# Patient Record
Sex: Male | Born: 1975 | Race: Black or African American | Hispanic: No | Marital: Married | Smoking: Current every day smoker
Health system: Southern US, Community
[De-identification: ages and names within clinical notes are randomized; demographics above are authoritative.]

## PROBLEM LIST (undated history)

## (undated) DIAGNOSIS — F1010 Alcohol abuse, uncomplicated: Secondary | ICD-10-CM

## (undated) DIAGNOSIS — T7840XA Allergy, unspecified, initial encounter: Secondary | ICD-10-CM

## (undated) DIAGNOSIS — K219 Gastro-esophageal reflux disease without esophagitis: Secondary | ICD-10-CM

## (undated) DIAGNOSIS — D649 Anemia, unspecified: Secondary | ICD-10-CM

## (undated) DIAGNOSIS — I160 Hypertensive urgency: Secondary | ICD-10-CM

## (undated) DIAGNOSIS — K7460 Unspecified cirrhosis of liver: Secondary | ICD-10-CM

## (undated) HISTORY — DX: Allergy, unspecified, initial encounter: T78.40XA

## (undated) HISTORY — PX: NO PAST SURGERIES: SHX2092

## (undated) HISTORY — DX: Gastro-esophageal reflux disease without esophagitis: K21.9

---

## 2013-05-04 ENCOUNTER — Emergency Department (HOSPITAL_BASED_OUTPATIENT_CLINIC_OR_DEPARTMENT_OTHER)
Admission: EM | Admit: 2013-05-04 | Discharge: 2013-05-04 | Disposition: A | Discharge status: Home / Self Care | Payer: BC Managed Care – PPO | Attending: Emergency Medicine | Admitting: Emergency Medicine

## 2013-05-04 ENCOUNTER — Encounter (HOSPITAL_BASED_OUTPATIENT_CLINIC_OR_DEPARTMENT_OTHER): Payer: Self-pay | Admitting: Emergency Medicine

## 2013-05-04 DIAGNOSIS — H0015 Chalazion left lower eyelid: Secondary | ICD-10-CM

## 2013-05-04 DIAGNOSIS — F17200 Nicotine dependence, unspecified, uncomplicated: Secondary | ICD-10-CM | POA: Insufficient documentation

## 2013-05-04 DIAGNOSIS — H0019 Chalazion unspecified eye, unspecified eyelid: Secondary | ICD-10-CM | POA: Insufficient documentation

## 2013-05-04 MED ORDER — ERYTHROMYCIN 2 % EX OINT: 0.5000 [in_us] | TOPICAL_OINTMENT | Freq: Two times a day (BID) | CUTANEOUS | Status: DC

## 2013-05-04 NOTE — ED Notes (Signed)
Pt amb to room 9 with quick steady gait in nad. Pt reports left eye drainage x Saturday, denies any pain or vision changes.

## 2013-05-04 NOTE — Discharge Instructions (Signed)
Chalazion  A chalazion is a swelling or hard lump on the eyelid caused by a blocked oil gland. Chalazions may occur on the upper or the lower eyelid.   CAUSES   Oil gland in the eyelid becomes blocked.  SYMPTOMS   · Swelling or hard lump on the eyelid. This lump may make it hard to see out of the eye.  · The swelling may spread to areas around the eye.  TREATMENT   · Although some chalazions disappear by themselves in 1 or 2 months, some chalazions may need to be removed.  · Medicines to treat an infection may be required.  HOME CARE INSTRUCTIONS   · Wash your hands often and dry them with a clean towel. Do not touch the chalazion.  · Apply heat to the eyelid several times a day for 10 minutes to help ease discomfort and bring any yellowish white fluid (pus) to the surface. One way to apply heat to a chalazion is to use the handle of a metal spoon.  · Hold the handle under hot water until it is hot, and then wrap the handle in paper towels so that the heat can come through without burning your skin.  · Hold the wrapped handle against the chalazion and reheat the spoon handle as needed.  · Apply heat in this fashion for 10 minutes, 4 times per day.  · Return to your caregiver to have the pus removed if it does not break (rupture) on its own.  · Do not try to remove the pus yourself by squeezing the chalazion or sticking it with a pin or needle.  · Only take over-the-counter or prescription medicines for pain, discomfort, or fever as directed by your caregiver.  SEEK IMMEDIATE MEDICAL CARE IF:   · You have pain in your eye.  · Your vision changes.  · The chalazion does not go away.  · The chalazion becomes painful, red, or swollen, grows larger, or does not start to disappear after 2 weeks.  MAKE SURE YOU:   · Understand these instructions.  · Will watch your condition.  · Will get help right away if you are not doing well or get worse.  Document Released: 02/22/2000 Document Revised: 05/19/2011 Document Reviewed:  06/11/2009  [employer]® Patient Information ©2014 [employer].

## 2013-05-04 NOTE — ED Provider Notes (Signed)
CSN: [id number]     Arrival date & time 05/04/13  [id number] History   First MD Initiated Contact with Patient 05/04/13 [phone]     Chief Complaint  Patient presents with  . Eye Problem     (Consider location/radiation/quality/duration/timing/severity/associated sxs/prior Treatment) Patient is a 38 y.o. male presenting with eye problem. The history is provided by the patient.  Eye Problem Location:  L eye Quality:  Tearing Severity:  Mild Onset quality:  Gradual Duration:  5 days Timing:  Constant Progression:  Unchanged Chronicity:  New Context: not burn, not chemical exposure, not foreign body and not using machinery   Relieved by:  Nothing Worsened by:  Nothing tried   History reviewed. No pertinent past medical history. History reviewed. No pertinent past surgical history. History reviewed. No pertinent family history. History  Substance Use Topics  . Smoking status: Current Every Day Smoker  . Smokeless tobacco: Not on file  . Alcohol Use: Not on file    Review of Systems  Constitutional: Negative for fever.  HENT: Negative for congestion, dental problem and sinus pressure.   All other systems reviewed and are negative.      Allergies  Review of patient's allergies indicates no known allergies.  Home Medications   Current Outpatient Rx  Name  Route  Sig  Dispense  Refill  . Erythromycin 2 % ointment   Topical   Apply 0.5 inches topically 2 (two) times daily. 0.5 inch ribbon in lower eyelid twice daily.   25 g   0    BP 146/97  Pulse 89  Temp(Src) 97.8 F (36.6 C) (Oral)  Resp 20  SpO2 99% Physical Exam  Nursing note and vitals reviewed. Constitutional: He is oriented to person, place, and time. He appears well-developed and well-nourished. No distress.  HENT:  Head: Normocephalic and atraumatic.    Mouth/Throat: No oropharyngeal exudate.  Eyes: EOM are normal. Pupils are equal, round, and reactive to light. Left eye exhibits no chemosis, no  discharge and no exudate. No foreign body present in the left eye. Left conjunctiva is not injected. Left conjunctiva has no hemorrhage. Left eye exhibits normal extraocular motion and no nystagmus. Left pupil is reactive.    Neck: Normal range of motion. Neck supple.  Cardiovascular: Normal rate and regular rhythm.  Exam reveals no friction rub.   No murmur heard. Pulmonary/Chest: Effort normal and breath sounds normal. No respiratory distress. He has no wheezes. He has no rales.  Abdominal: He exhibits no distension. There is no tenderness. There is no rebound.  Musculoskeletal: Normal range of motion. He exhibits no edema.  Neurological: He is alert and oriented to person, place, and time.  Skin: He is not diaphoretic.    ED Course  Procedures (including critical care time) Labs Review Labs Reviewed - No data to display Imaging Review No results found.  EKG Interpretation   None       MDM   Final diagnoses:  Chalazion of left lower eyelid    59M with L lower eyelid chalazion. No conjunctivitis noted. No fevers, URI symptoms. Mild left lateral orbital swelling. No cellulitis present. Given Erythromycin ointment and instructed to use warm compresses multiple times daily. Instructed to f/u with Optometrist.    Dagmar HaitWilliam Joniah Bednarski, MD 05/04/13 [phone]

## 2013-05-04 NOTE — ED Notes (Signed)
MD at bedside. 

## 2013-11-13 ENCOUNTER — Emergency Department (HOSPITAL_BASED_OUTPATIENT_CLINIC_OR_DEPARTMENT_OTHER)
Admission: EM | Admit: 2013-11-13 | Discharge: 2013-11-13 | Disposition: A | Discharge status: Home / Self Care | Payer: BC Managed Care – PPO | Attending: Emergency Medicine | Admitting: Emergency Medicine

## 2013-11-13 ENCOUNTER — Encounter (HOSPITAL_BASED_OUTPATIENT_CLINIC_OR_DEPARTMENT_OTHER): Payer: Self-pay | Admitting: Emergency Medicine

## 2013-11-13 DIAGNOSIS — Y929 Unspecified place or not applicable: Secondary | ICD-10-CM | POA: Diagnosis not present

## 2013-11-13 DIAGNOSIS — Y939 Activity, unspecified: Secondary | ICD-10-CM | POA: Diagnosis not present

## 2013-11-13 DIAGNOSIS — T63461A Toxic effect of venom of wasps, accidental (unintentional), initial encounter: Secondary | ICD-10-CM | POA: Diagnosis not present

## 2013-11-13 DIAGNOSIS — T6391XA Toxic effect of contact with unspecified venomous animal, accidental (unintentional), initial encounter: Secondary | ICD-10-CM | POA: Insufficient documentation

## 2013-11-13 DIAGNOSIS — T63481A Toxic effect of venom of other arthropod, accidental (unintentional), initial encounter: Secondary | ICD-10-CM

## 2013-11-13 DIAGNOSIS — F17200 Nicotine dependence, unspecified, uncomplicated: Secondary | ICD-10-CM | POA: Insufficient documentation

## 2013-11-13 NOTE — ED Provider Notes (Signed)
CSN: [id number]     Arrival date & time 11/13/13  1324 History   First MD Initiated Contact with Patient 11/13/13 0827     Chief Complaint  Patient presents with  . Insect Bite     (Consider location/radiation/quality/duration/timing/severity/associated sxs/prior Treatment) HPI Comments: The patient reports he was stung by an insect around 11 AM yesterday and has since developed itching and swelling over the lateral surface of his left hand the bite occurred. He did not see the insect is not sure what of a It shortness of breath, nausea, vomiting, swelling of the face and mouth. He has not tried anything for this. He otherwise feels well.   History reviewed. No pertinent past medical history. History reviewed. No pertinent past surgical history. No family history on file. History  Substance Use Topics  . Smoking status: Current Every Day Smoker  . Smokeless tobacco: Not on file  . Alcohol Use: Yes     Comment: beer at night    Review of Systems  Constitutional: Negative for fever, activity change, appetite change and fatigue.  HENT: Negative for congestion, facial swelling, rhinorrhea and trouble swallowing.   Eyes: Negative for photophobia and pain.  Respiratory: Negative for cough, chest tightness and shortness of breath.   Cardiovascular: Negative for chest pain and leg swelling.  Gastrointestinal: Negative for nausea, vomiting, abdominal pain, diarrhea and constipation.  Endocrine: Negative for polydipsia and polyuria.  Genitourinary: Negative for dysuria, urgency, decreased urine volume and difficulty urinating.  Musculoskeletal: Negative for back pain and gait problem.  Skin: Positive for wound. Negative for color change and rash.  Allergic/Immunologic: Negative for immunocompromised state.  Neurological: Negative for dizziness, facial asymmetry, speech difficulty, weakness, numbness and headaches.  Psychiatric/Behavioral: Negative for confusion, decreased concentration and  agitation.      Allergies  Review of patient's allergies indicates no known allergies.  Home Medications   Prior to Admission medications   Medication Sig Start Date End Date Taking? Authorizing Provider  Erythromycin 2 % ointment Apply 0.5 inches topically 2 (two) times daily. 0.5 inch ribbon in lower eyelid twice daily. 05/04/13   Elwin Mocha, MD   BP 157/87  Pulse 93  Temp(Src) 98.5 F (36.9 C) (Oral)  Resp 20  SpO2 99% Physical Exam  Constitutional: He is oriented to person, place, and time. He appears well-developed and well-nourished. No distress.  HENT:  Head: Normocephalic and atraumatic.  Mouth/Throat: No oropharyngeal exudate.  Eyes: Pupils are equal, round, and reactive to light.  Neck: Normal range of motion. Neck supple.  Cardiovascular: Normal rate, regular rhythm and normal heart sounds.  Exam reveals no gallop and no friction rub.   No murmur heard. Pulmonary/Chest: Effort normal and breath sounds normal. No respiratory distress. He has no wheezes. He has no rales.  Abdominal: Soft. Bowel sounds are normal. He exhibits no distension and no mass. There is no tenderness. There is no rebound and no guarding.  Musculoskeletal: Normal range of motion. He exhibits no edema and no tenderness.       Hands: Neurological: He is alert and oriented to person, place, and time.  Skin: Skin is warm and dry.  Psychiatric: He has a normal mood and affect.    ED Course  Procedures (including critical care time) Labs Review Labs Reviewed - No data to display  Imaging Review No results found.   EKG Interpretation None      MDM   Final diagnoses:  Allergic reaction to insect sting, accidental or unintentional, initial  encounter    Pt is a 38 y.o. male with Pmhx as above who presents with localized allergic reaction to Hymenoptera sting which occurred about 22 hours ago. Patient is otherwise well and has no signs or symptoms thoraxes DC home with instructions for  every 6 hours meningeal and/or OTC anti-itch cream. Return precautions given for any worsening symptoms including worsening redness swelling, or red streaking.        Toy Cookey, MD 11/13/13 [phone]

## 2013-11-13 NOTE — Discharge Instructions (Signed)

## 2013-11-13 NOTE — ED Notes (Addendum)
Yesterday pt thinks he may have gotten bitten by an insect, unknown which. Outer left hand is swollen and itching.

## 2015-04-10 ENCOUNTER — Encounter: Payer: Self-pay | Admitting: Medical

## 2015-04-10 ENCOUNTER — Ambulatory Visit (INDEPENDENT_AMBULATORY_CARE_PROVIDER_SITE_OTHER): Payer: BLUE CROSS/BLUE SHIELD | Admitting: Medical

## 2015-04-10 ENCOUNTER — Other Ambulatory Visit (HOSPITAL_COMMUNITY)
Admission: RE | Admit: 2015-04-10 | Discharge: 2015-04-10 | Disposition: A | Discharge status: Home / Self Care | Payer: BLUE CROSS/BLUE SHIELD | Source: Ambulatory Visit | Attending: Medical | Admitting: Medical

## 2015-04-10 VITALS — BP 120/86 | HR 78 | Temp 98.0°F | Ht 67.5 in | Wt 132.6 lb

## 2015-04-10 DIAGNOSIS — Z202 Contact with and (suspected) exposure to infections with a predominantly sexual mode of transmission: Secondary | ICD-10-CM

## 2015-04-10 DIAGNOSIS — Z113 Encounter for screening for infections with a predominantly sexual mode of transmission: Secondary | ICD-10-CM | POA: Diagnosis present

## 2015-04-10 DIAGNOSIS — Z720 Tobacco use: Secondary | ICD-10-CM | POA: Diagnosis not present

## 2015-04-10 DIAGNOSIS — J029 Acute pharyngitis, unspecified: Secondary | ICD-10-CM | POA: Diagnosis not present

## 2015-04-10 DIAGNOSIS — J309 Allergic rhinitis, unspecified: Secondary | ICD-10-CM

## 2015-04-10 DIAGNOSIS — F17200 Nicotine dependence, unspecified, uncomplicated: Secondary | ICD-10-CM | POA: Insufficient documentation

## 2015-04-10 LAB — RPR

## 2015-04-10 LAB — HIV ANTIBODY (ROUTINE TESTING W REFLEX): HIV: NONREACTIVE

## 2015-04-10 MED ORDER — METRONIDAZOLE 500 MG PO TABS: 500.0000 mg | ORAL_TABLET | Freq: Two times a day (BID) | ORAL | Status: DC

## 2015-04-10 MED ORDER — FLUTICASONE PROPIONATE 50 MCG/ACT NA SUSP: 2.0000 | Freq: Every day | NASAL | Status: DC

## 2015-04-10 MED ORDER — AZITHROMYCIN 250 MG PO TABS: ORAL_TABLET | ORAL | Status: DC

## 2015-04-10 NOTE — Progress Notes (Signed)
Pre visit review using our clinic review tool, if applicable. No additional management support is needed unless otherwise documented below in the visit note. 

## 2015-04-10 NOTE — Patient Instructions (Signed)
For trichomonas exposure will prescribe flagyl. Also will get urine std test and labs as well.  For pharyngitis and reported level of pain will get throat culture and prescribe azithromycin.  You do have some history of allergies and by exam inside of nose appears inflammed. I will prescribe flonase nasal spray for allergic rhinitis.  Follow up 1-2 wks or as needed  You could schedule a CPE on follow up. If schedule ask fo 8 am appointment. Come in fasting so we can get labs fasting.

## 2015-04-10 NOTE — Assessment & Plan Note (Signed)
flonase rx today.

## 2015-04-10 NOTE — Progress Notes (Signed)
Subjective:    Patient ID: Samuel Cantrell, male    DOB: 1975/04/15, 40 y.o.   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  Allergic rhinitis- mild allergies year round. He takes benadryl. Genella Rife- very mild and only one time a month. If eats pepperoni pizza.  Smoker- for 8-9 years.   Pt in for follow up. Pt wife had trichomonas. Pt states he has no symptoms. But his wife was told partner needs to be treated.  Pt has sore throat since last Sunday. Has been moderate to severe. Pt states fever about 5 days ago. No sneezing, no itchy eyes or runny nose. No body aches.  Pt has not had physical in some time.      Review of Systems  Constitutional: Positive for fever. Negative for chills and fatigue.       Early on.  HENT: Positive for postnasal drip and sore throat. Negative for congestion, ear pain, hearing loss, mouth sores, sinus pressure, tinnitus and voice change.        At times feels mucous on back of his throat.  Respiratory: Negative for apnea, cough, choking and wheezing.   Cardiovascular: Negative for chest pain and palpitations.  Gastrointestinal: Negative for abdominal pain.  Musculoskeletal: Negative for myalgias and back pain.  Neurological: Negative for dizziness and headaches.  Hematological: Negative for adenopathy. Does not bruise/bleed easily.  Psychiatric/Behavioral: Negative for behavioral problems and confusion.     History reviewed. No pertinent past medical history.  Social History   Social History  . Marital Status: Married    Spouse Name: N/A  . Number of Children: N/A  . Years of Education: N/A   Occupational History  . Not on file.   Social History Main Topics  . Smoking status: Current Every Day Smoker  . Smokeless tobacco: Not on file  . Alcohol Use: Yes     Comment: beer at night  . Drug Use:  No  . Sexual Activity: Not on file   Other Topics Concern  . Not on file   Social History Narrative    History reviewed. No pertinent past surgical history.  History reviewed. No pertinent family history.  No Known Allergies  No current outpatient prescriptions on file prior to visit.   No current facility-administered medications on file prior to visit.    BP 120/86 mmHg  Pulse 78  Temp(Src) 98 F (36.7 C) (Oral)  Ht 5' 7.5" (1.715 m)  Wt 132 lb 9.6 oz (60.147 kg)  BMI 20.45 kg/m2  SpO2 98%       Objective:   Physical Exam  General  Mental Status - Alert. General Appearance - Well groomed. Not in acute distress.  Skin Rashes- No Rashes.  HEENT Head- Normal. Ear Auditory Canal - Left- Normal. Right - Normal.Tympanic Membrane- Left- Normal. Right- Normal. Eye Sclera/Conjunctiva- Left- Normal. Right- Normal. Nose & Sinuses Nasal Mucosa- Left-  Boggy and Congested. Right-  Boggy and  Congested.Bilateral no  maxillary and no  frontal sinus pressure. Mouth & Throat Lips: Upper Lip- Normal: no dryness, cracking, pallor, cyanosis, or vesicular eruption. Lower Lip-Normal: no dryness, cracking, pallor, cyanosis or vesicular eruption. Buccal Mucosa- Bilateral- No Aphthous ulcers. Oropharynx- No Discharge but  Erythema. Tonsils: Characteristics- Bilateral- mild Erythema and  Congestion. Size/Enlargement- Bilateral- No enlargement. Discharge- bilateral-None.  Neck Neck- Supple.  No Masses.   Chest and Lung Exam Auscultation: Breath Sounds:-Clear even and unlabored.  Cardiovascular Auscultation:Rythm- Regular, rate and rhythm. Murmurs & Other Heart Sounds:Ausculatation of the heart reveal- No Murmurs.  Lymphatic Head & Neck General Head & Neck Lymphatics: Bilateral: Description- No Localized lymphadenopathy.   Genital- no discharge from meatus. No lesion penis. Testicles non-tender.        Assessment & Plan:  For trichomonas exposure will prescribe  flagyl. Also will get urine std test and labs as well.  For pharyngitis and reported level of pain will get throat culture and prescribe azithromycin.  You do have some history of allergies and by exam inside of nose appears inflammed. I will prescribe flonase nasal spray for allergic rhinitis.  Follow up 1-2 wks or as needed  You could schedule a CPE on follow up. If schedule ask fo 8 am appointment. Come in fasting so we can get labs fasting.

## 2015-04-10 NOTE — Assessment & Plan Note (Signed)
Will discuss and counsel on next visit. Plan to offer wellbutrin.

## 2015-04-11 LAB — URINE CYTOLOGY ANCILLARY ONLY
Chlamydia: NEGATIVE
Neisseria Gonorrhea: NEGATIVE
Trichomonas: NEGATIVE

## 2015-04-11 LAB — CULTURE, GROUP A STREP

## 2015-04-14 ENCOUNTER — Other Ambulatory Visit: Payer: Self-pay | Admitting: Medical

## 2015-04-16 NOTE — Telephone Encounter (Signed)
Samuel Cantrell please advise, Pt states that he is still having some sore throat symptoms and wanted to know if he could have a refill of the antibiotic.

## 2015-04-16 NOTE — Telephone Encounter (Signed)
Spoke with pt  and he states that he is feeling better now and was offered an appointment and he states that he wanted to make sure that all the infection was gone. Pt will call back if he has any other symptoms. Per PCP pt will need to come in for an appointment and pt declined appointment.

## 2015-05-02 ENCOUNTER — Ambulatory Visit (INDEPENDENT_AMBULATORY_CARE_PROVIDER_SITE_OTHER): Payer: BLUE CROSS/BLUE SHIELD | Admitting: Medical

## 2015-05-02 ENCOUNTER — Encounter: Payer: Self-pay | Admitting: Medical

## 2015-05-02 VITALS — BP 112/82 | HR 94 | Temp 98.4°F | Ht 67.5 in | Wt 133.6 lb

## 2015-05-02 DIAGNOSIS — Z0000 Encounter for general adult medical examination without abnormal findings: Secondary | ICD-10-CM | POA: Diagnosis not present

## 2015-05-02 DIAGNOSIS — Z23 Encounter for immunization: Secondary | ICD-10-CM | POA: Diagnosis not present

## 2015-05-02 LAB — CBC WITH DIFFERENTIAL/PLATELET
BASOS ABS: 0 10*3/uL (ref 0.0–0.1)
Basophils Relative: 0.7 % (ref 0.0–3.0)
EOS ABS: 0.1 10*3/uL (ref 0.0–0.7)
Eosinophils Relative: 2.8 % (ref 0.0–5.0)
HEMATOCRIT: 46 % (ref 39.0–52.0)
HEMOGLOBIN: 15.6 g/dL (ref 13.0–17.0)
LYMPHS PCT: 50.5 % — AB (ref 12.0–46.0)
Lymphs Abs: 2.5 10*3/uL (ref 0.7–4.0)
MCHC: 33.9 g/dL (ref 30.0–36.0)
MCV: 84.4 fl (ref 78.0–100.0)
MONO ABS: 0.6 10*3/uL (ref 0.1–1.0)
Monocytes Relative: 11.4 % (ref 3.0–12.0)
Neutro Abs: 1.7 10*3/uL (ref 1.4–7.7)
Neutrophils Relative %: 34.6 % — ABNORMAL LOW (ref 43.0–77.0)
Platelets: 159 10*3/uL (ref 150.0–400.0)
RBC: 5.46 Mil/uL (ref 4.22–5.81)
RDW: 14.3 % (ref 11.5–15.5)
WBC: 4.9 10*3/uL (ref 4.0–10.5)

## 2015-05-02 LAB — POC URINALSYSI DIPSTICK (AUTOMATED)
Bilirubin, UA: NEGATIVE
Blood, UA: NEGATIVE
Glucose, UA: NEGATIVE
Ketones, UA: NEGATIVE
LEUKOCYTES UA: NEGATIVE
Nitrite, UA: NEGATIVE
PROTEIN UA: NEGATIVE
Spec Grav, UA: <=1.005
Urobilinogen, UA: 0.2
pH, UA: 6

## 2015-05-02 LAB — COMPREHENSIVE METABOLIC PANEL
ALBUMIN: 4.4 g/dL (ref 3.5–5.2)
ALT: 50 U/L (ref 0–53)
AST: 82 U/L — ABNORMAL HIGH (ref 0–37)
Alkaline Phosphatase: 89 U/L (ref 39–117)
BILIRUBIN TOTAL: 1.1 mg/dL (ref 0.2–1.2)
BUN: 8 mg/dL (ref 6–23)
CALCIUM: 9.3 mg/dL (ref 8.4–10.5)
CHLORIDE: 98 meq/L (ref 96–112)
CO2: 32 mEq/L (ref 19–32)
Creatinine, Ser: 0.77 mg/dL (ref 0.40–1.50)
GFR: 142.48 mL/min (ref >60.00)
Glucose, Bld: 102 mg/dL — ABNORMAL HIGH (ref 70–99)
Potassium: 3.4 mEq/L — ABNORMAL LOW (ref 3.5–5.1)
SODIUM: 137 meq/L (ref 135–145)
Total Protein: 7.9 g/dL (ref 6.0–8.3)

## 2015-05-02 LAB — LIPID PANEL
CHOLESTEROL: 172 mg/dL (ref 0–200)
HDL: 69.9 mg/dL (ref >39.00)
LDL CALC: 88 mg/dL (ref 0–99)
NONHDL: 102.54
Total CHOL/HDL Ratio: 2
Triglycerides: 72 mg/dL (ref 0.0–149.0)
VLDL: 14.4 mg/dL (ref 0.0–40.0)

## 2015-05-02 LAB — TSH: TSH: 1.22 u[IU]/mL (ref 0.35–4.50)

## 2015-05-02 NOTE — Addendum Note (Signed)
Addended by: Neldon Labella on: 05/02/2015 08:49 AM   Modules accepted: Orders

## 2015-05-02 NOTE — Assessment & Plan Note (Signed)
Cbc, cmp, tsh, lipid, ua tdap today.

## 2015-05-02 NOTE — Progress Notes (Signed)
Subjective:    Patient ID: Samuel Cantrell, male    DOB: [birth year], 40 y.o.   MRN: [id number]  HPI  Pt is here for Physical exam. 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  Pt needs tdap. Will get tdap.   Pt does not take flu vaccine.  Pt is smoking 6-7 cigarettes. Pt  Drinks 2-3 beers 3-4 times a week.     Below block of empty data accidentally pulled in to chart. Then tried to delete. Text deleted but format not deleted.                                                                                                                                                                                                                                                                                                                                                                                                                                                       Past Medical History  Diagnosis Date  . Allergy   . GERD (gastroesophageal reflux disease)     Social History   Social History  . Marital Status: Married    Spouse Name: N/A  . Number of Children: N/A  . Years of Education:  N/A   Occupational History  . Not on file.   Social History Main Topics  . Smoking status: Current Every Day Smoker -- 0.50 packs/day for 8 years    Types: Cigarettes  . Smokeless tobacco: Not on file  . Alcohol Use: Yes     Comment: every other night 2 beers  . Drug Use: No  . Sexual Activity: Yes   Other Topics Concern  . Not on file   Social History Narrative    History reviewed. No pertinent past surgical history.  Family History  Problem Relation Age of Onset  . Hypertension Mother     No Known Allergies  Current Outpatient Prescriptions on File Prior to Visit   Medication Sig Dispense Refill  . DiphenhydrAMINE HCl (BENADRYL ALLERGY PO) Take by mouth as needed.    . fluticasone (FLONASE) 50 MCG/ACT nasal spray Place 2 sprays into both nostrils daily. 16 g 1   No current facility-administered medications on file prior to visit.    BP 112/82 mmHg  Pulse 94  Temp(Src) 98.4 F (36.9 C) (Oral)  Ht 5' 7.5" (1.715 m)  Wt 133 lb 9.6 oz (60.601 kg)  BMI 20.60 kg/m2  SpO2 98%       Review of Systems  Constitutional: Negative for fever, chills and fatigue.  HENT: Negative for congestion, ear pain, hearing loss, mouth sores, nosebleeds and postnasal drip.   Respiratory: Negative for cough, chest tightness, shortness of breath and wheezing.   Cardiovascular: Negative for chest pain and palpitations.  Gastrointestinal: Negative for abdominal pain.  Genitourinary: Negative for dysuria.  Musculoskeletal: Negative for myalgias and back pain.  Neurological: Negative for dizziness and facial asymmetry.  Hematological: Negative for adenopathy. Does not bruise/bleed easily.  Psychiatric/Behavioral: Negative for behavioral problems and confusion.       Objective:   Physical Exam  General Mental Status- Alert. General Appearance- Not in acute distress.   Skin General: Color- Normal Color. Moisture- Normal Moisture.  Neck Carotid Arteries- Normal color. Moisture- Normal Moisture. No carotid bruits. No JVD.  Chest and Lung Exam Auscultation: Breath Sounds:-Normal.  Cardiovascular Auscultation:Rythm- Regular. Murmurs & Other Heart Sounds:Auscultation of the heart reveals- No Murmurs.  Abdomen Inspection:-Inspeection Normal. Palpation/Percussion:Note:No mass. Palpation and Percussion of the abdomen reveal- Non Tender, Non Distended + BS, no rebound or guarding.   Neurologic Cranial Nerve exam:- CN III-XII intact(No nystagmus), symmetric smile. Strength:- 5/5 equal and symmetric strength both upper and lower extremities.  Genital-  deferred since checked just recent and no new symptoms. Rectal- not done due to age and neg fh hx except grandad prostate CA in 40 years of age.      Assessment & Plan:

## 2015-05-02 NOTE — Addendum Note (Signed)
Addended by: Neldon Labella on: 05/02/2015 08:47 AM   Modules accepted: Orders

## 2015-05-02 NOTE — Patient Instructions (Addendum)
Wellness examination Cbc, cmp, tsh, lipid, ua tdap today.   Counseled and recommended to stop smoking. Pt not ready. Offered rx to help when he feels ready to try/motivated.  Preventive Care for Adults, Male A healthy lifestyle and preventive care can promote health and wellness. Preventive health guidelines for men include the following key practices:  A routine yearly physical is a good way to check with your health care provider about your health and preventative screening. It is a chance to share any concerns and updates on your health and to receive a thorough exam.  Visit your dentist for a routine exam and preventative care every 6 months. Brush your teeth twice a day and floss once a day. Good oral hygiene prevents tooth decay and gum disease.  The frequency of eye exams is based on your age, health, family medical history, use of contact lenses, and other factors. Follow your health care provider's recommendations for frequency of eye exams.  Eat a healthy diet. Foods such as vegetables, fruits, whole grains, low-fat dairy products, and lean protein foods contain the nutrients you need without too many calories. Decrease your intake of foods high in solid fats, added sugars, and salt. Eat the right amount of calories for you.Get information about a proper diet from your health care provider, if necessary.  Regular physical exercise is one of the most important things you can do for your health. Most adults should get at least 150 minutes of moderate-intensity exercise (any activity that increases your heart rate and causes you to sweat) each week. In addition, most adults need muscle-strengthening exercises on 2 or more days a week.  Maintain a healthy weight. The body mass index (BMI) is a screening tool to identify possible weight problems. It provides an estimate of body fat based on height and weight. Your health care provider can find your BMI and can help you achieve or maintain a  healthy weight.For adults 20 years and older:  A BMI below 18.5 is considered underweight.  A BMI of 18.5 to 24.9 is normal.  A BMI of 25 to 29.9 is considered overweight.  A BMI of 30 and above is considered obese.  Maintain normal blood lipids and cholesterol levels by exercising and minimizing your intake of saturated fat. Eat a balanced diet with plenty of fruit and vegetables. Blood tests for lipids and cholesterol should begin at age 45 and be repeated every 5 years. If your lipid or cholesterol levels are high, you are over 50, or you are at high risk for heart disease, you may need your cholesterol levels checked more frequently.Ongoing high lipid and cholesterol levels should be treated with medicines if diet and exercise are not working.  If you smoke, find out from your health care provider how to quit. If you do not use tobacco, do not start.  Lung cancer screening is recommended for adults aged 62-80 years who are at high risk for developing lung cancer because of a history of smoking. A yearly low-dose CT scan of the lungs is recommended for people who have at least a 30-pack-year history of smoking and are a current smoker or have quit within the past 15 years. A pack year of smoking is smoking an average of 1 pack of cigarettes a day for 1 year (for example: 1 pack a day for 30 years or 2 packs a day for 15 years). Yearly screening should continue until the smoker has stopped smoking for at least 15 years. Yearly  screening should be stopped for people who develop a health problem that would prevent them from having lung cancer treatment.  If you choose to drink alcohol, do not have more than 2 drinks per day. One drink is considered to be 12 ounces (355 mL) of beer, 5 ounces (148 mL) of wine, or 1.5 ounces (44 mL) of liquor.  Avoid use of street drugs. Do not share needles with anyone. Ask for help if you need support or instructions about stopping the use of drugs.  High blood  pressure causes heart disease and increases the risk of stroke. Your blood pressure should be checked at least every 1-2 years. Ongoing high blood pressure should be treated with medicines, if weight loss and exercise are not effective.  If you are 20-1 years old, ask your health care provider if you should take aspirin to prevent heart disease.  Diabetes screening is done by taking a blood sample to check your blood glucose level after you have not eaten for a certain period of time (fasting). If you are not overweight and you do not have risk factors for diabetes, you should be screened once every 3 years starting at age 71. If you are overweight or obese and you are 12-87 years of age, you should be screened for diabetes every year as part of your cardiovascular risk assessment.  Colorectal cancer can be detected and often prevented. Most routine colorectal cancer screening begins at the age of 50 and continues through age 1. However, your health care provider may recommend screening at an earlier age if you have risk factors for colon cancer. On a yearly basis, your health care provider may provide home test kits to check for hidden blood in the stool. Use of a small camera at the end of a tube to directly examine the colon (sigmoidoscopy or colonoscopy) can detect the earliest forms of colorectal cancer. Talk to your health care provider about this at age 46, when routine screening begins. Direct exam of the colon should be repeated every 5-10 years through age 79, unless early forms of precancerous polyps or small growths are found.  People who are at an increased risk for hepatitis B should be screened for this virus. You are considered at high risk for hepatitis B if:  You were born in a country where hepatitis B occurs often. Talk with your health care provider about which countries are considered high risk.  Your parents were born in a high-risk country and you have not received a shot to  protect against hepatitis B (hepatitis B vaccine).  You have HIV or AIDS.  You use needles to inject street drugs.  You live with, or have sex with, someone who has hepatitis B.  You are a man who has sex with other men (MSM).  You get hemodialysis treatment.  You take certain medicines for conditions such as cancer, organ transplantation, and autoimmune conditions.  Hepatitis C blood testing is recommended for all people born from [birth year] through 1965 and any individual with known risks for hepatitis C.  Practice safe sex. Use condoms and avoid high-risk sexual practices to reduce the spread of sexually transmitted infections (STIs). STIs include gonorrhea, chlamydia, syphilis, trichomonas, herpes, HPV, and human immunodeficiency virus (HIV). Herpes, HIV, and HPV are viral illnesses that have no cure. They can result in disability, cancer, and death.  If you are a man who has sex with other men, you should be screened at least once per year for:  HIV.  Urethral, rectal, and pharyngeal infection of gonorrhea, chlamydia, or both.  If you are at risk of being infected with HIV, it is recommended that you take a prescription medicine daily to prevent HIV infection. This is called preexposure prophylaxis (PrEP). You are considered at risk if:  You are a man who has sex with other men (MSM) and have other risk factors.  You are a heterosexual man, are sexually active, and are at increased risk for HIV infection.  You take drugs by injection.  You are sexually active with a partner who has HIV.  Talk with your health care provider about whether you are at high risk of being infected with HIV. If you choose to begin PrEP, you should first be tested for HIV. You should then be tested every 3 months for as long as you are taking PrEP.  A one-time screening for abdominal aortic aneurysm (AAA) and surgical repair of large AAAs by ultrasound are recommended for men ages 47 to 62 years who are  current or former smokers.  Healthy men should no longer receive prostate-specific antigen (PSA) blood tests as part of routine cancer screening. Talk with your health care provider about prostate cancer screening.  Testicular cancer screening is not recommended for adult males who have no symptoms. Screening includes self-exam, a health care provider exam, and other screening tests. Consult with your health care provider about any symptoms you have or any concerns you have about testicular cancer.  Use sunscreen. Apply sunscreen liberally and repeatedly throughout the day. You should seek shade when your shadow is shorter than you. Protect yourself by wearing long sleeves, pants, a wide-brimmed hat, and sunglasses year round, whenever you are outdoors.  Once a month, do a whole-body skin exam, using a mirror to look at the skin on your back. Tell your health care provider about new moles, moles that have irregular borders, moles that are larger than a pencil eraser, or moles that have changed in shape or color.  Stay current with required vaccines (immunizations).  Influenza vaccine. All adults should be immunized every year.  Tetanus, diphtheria, and acellular pertussis (Td, Tdap) vaccine. An adult who has not previously received Tdap or who does not know his vaccine status should receive 1 dose of Tdap. This initial dose should be followed by tetanus and diphtheria toxoids (Td) booster doses every 10 years. Adults with an unknown or incomplete history of completing a 3-dose immunization series with Td-containing vaccines should begin or complete a primary immunization series including a Tdap dose. Adults should receive a Td booster every 10 years.  Varicella vaccine. An adult without evidence of immunity to varicella should receive 2 doses or a second dose if he has previously received 1 dose.  Human papillomavirus (HPV) vaccine. Males aged 11-21 years who have not received the vaccine  previously should receive the 3-dose series. Males aged 22-26 years may be immunized. Immunization is recommended through the age of 36 years for any male who has sex with males and did not get any or all doses earlier. Immunization is recommended for any person with an immunocompromised condition through the age of 32 years if he did not get any or all doses earlier. During the 3-dose series, the second dose should be obtained 4-8 weeks after the first dose. The third dose should be obtained 24 weeks after the first dose and 16 weeks after the second dose.  Zoster vaccine. One dose is recommended for adults aged 43 years  or older unless certain conditions are present.  Measles, mumps, and rubella (MMR) vaccine. Adults born before [birth year] generally are considered immune to measles and mumps. Adults born in [birth year] or later should have 1 or more doses of MMR vaccine unless there is a contraindication to the vaccine or there is laboratory evidence of immunity to each of the three diseases. A routine second dose of MMR vaccine should be obtained at least 28 days after the first dose for students attending postsecondary schools, health care workers, or international travelers. People who received inactivated measles vaccine or an unknown type of measles vaccine during 1963-1967 should receive 2 doses of MMR vaccine. People who received inactivated mumps vaccine or an unknown type of mumps vaccine before 1979 and are at high risk for mumps infection should consider immunization with 2 doses of MMR vaccine. Unvaccinated health care workers born before [birth year] who lack laboratory evidence of measles, mumps, or rubella immunity or laboratory confirmation of disease should consider measles and mumps immunization with 2 doses of MMR vaccine or rubella immunization with 1 dose of MMR vaccine.  Pneumococcal 13-valent conjugate (PCV13) vaccine. When indicated, a person who is uncertain of his immunization history and has no record  of immunization should receive the PCV13 vaccine. All adults 29 years of age and older should receive this vaccine. An adult aged 104 years or older who has certain medical conditions and has not been previously immunized should receive 1 dose of PCV13 vaccine. This PCV13 should be followed with a dose of pneumococcal polysaccharide (PPSV23) vaccine. Adults who are at high risk for pneumococcal disease should obtain the PPSV23 vaccine at least 8 weeks after the dose of PCV13 vaccine. Adults older than 43 years of age who have normal immune system function should obtain the PPSV23 vaccine dose at least 1 year after the dose of PCV13 vaccine.  Pneumococcal polysaccharide (PPSV23) vaccine. When PCV13 is also indicated, PCV13 should be obtained first. All adults aged 67 years and older should be immunized. An adult younger than age 34 years who has certain medical conditions should be immunized. Any person who resides in a nursing home or long-term care facility should be immunized. An adult smoker should be immunized. People with an immunocompromised condition and certain other conditions should receive both PCV13 and PPSV23 vaccines. People with human immunodeficiency virus (HIV) infection should be immunized as soon as possible after diagnosis. Immunization during chemotherapy or radiation therapy should be avoided. Routine use of PPSV23 vaccine is not recommended for American Indians, [place]atives, or people younger than 65 years unless there are medical conditions that require PPSV23 vaccine. When indicated, people who have unknown immunization and have no record of immunization should receive PPSV23 vaccine. One-time revaccination 5 years after the first dose of PPSV23 is recommended for people aged 19-64 years who have chronic kidney failure, nephrotic syndrome, asplenia, or immunocompromised conditions. People who received 1-2 doses of PPSV23 before age 88 years should receive another dose of PPSV23 vaccine  at age 40 years or later if at least 5 years have passed since the previous dose. Doses of PPSV23 are not needed for people immunized with PPSV23 at or after age 69 years.  Meningococcal vaccine. Adults with asplenia or persistent complement component deficiencies should receive 2 doses of quadrivalent meningococcal conjugate (MenACWY-D) vaccine. The doses should be obtained at least 2 months apart. Microbiologists working with certain meningococcal bacteria, [employer], people at risk during an outbreak, and people who travel to or live  in countries with a high rate of meningitis should be immunized. A first-year college student up through age 28 years who is living in a residence hall should receive a dose if he did not receive a dose on or after his 16th birthday. Adults who have certain high-risk conditions should receive one or more doses of vaccine.  Hepatitis A vaccine. Adults who wish to be protected from this disease, have chronic liver disease, work with hepatitis A-infected animals, work in hepatitis A research labs, or travel to or work in countries with a high rate of hepatitis A should be immunized. Adults who were previously unvaccinated and who anticipate close contact with an international adoptee during the first 60 days after arrival in the [place] from a country with a high rate of hepatitis A should be immunized.  Hepatitis B vaccine. Adults should be immunized if they wish to be protected from this disease, are under age 15 years and have diabetes, have chronic liver disease, have had more than one sex partner in the past 6 months, may be exposed to blood or other infectious body fluids, are household contacts or sex partners of hepatitis B positive people, are clients or workers in certain care facilities, or travel to or work in countries with a high rate of hepatitis B.  Haemophilus influenzae type b (Hib) vaccine. A previously unvaccinated person with asplenia or sickle  cell disease or having a scheduled splenectomy should receive 1 dose of Hib vaccine. Regardless of previous immunization, a recipient of a hematopoietic stem cell transplant should receive a 3-dose series 6-12 months after his successful transplant. Hib vaccine is not recommended for adults with HIV infection. Preventive Service / Frequency Ages 66 to 99  Blood pressure check.** / Every 3-5 years.  Lipid and cholesterol check.** / Every 5 years beginning at age 16.  Hepatitis C blood test.** / For any individual with known risks for hepatitis C.  Skin self-exam. / Monthly.  Influenza vaccine. / Every year.  Tetanus, diphtheria, and acellular pertussis (Tdap, Td) vaccine.** / Consult your health care provider. 1 dose of Td every 10 years.  Varicella vaccine.** / Consult your health care provider.  HPV vaccine. / 3 doses over 6 months, if 58 or younger.  Measles, mumps, rubella (MMR) vaccine.** / You need at least 1 dose of MMR if you were born in 1957 or later. You may also need a second dose.  Pneumococcal 13-valent conjugate (PCV13) vaccine.** / Consult your health care provider.  Pneumococcal polysaccharide (PPSV23) vaccine.** / 1 to 2 doses if you smoke cigarettes or if you have certain conditions.  Meningococcal vaccine.** / 1 dose if you are age 50 to 30 years and a Market researcher living in a residence hall, or have one of several medical conditions. You may also need additional booster doses.  Hepatitis A vaccine.** / Consult your health care provider.  Hepatitis B vaccine.** / Consult your health care provider.  Haemophilus influenzae type b (Hib) vaccine.** / Consult your health care provider. Ages 29 to 51  Blood pressure check.** / Every year.  Lipid and cholesterol check.** / Every 5 years beginning at age 58.  Lung cancer screening. / Every year if you are aged 21-80 years and have a 30-pack-year history of smoking and currently smoke or have quit within  the past 15 years. Yearly screening is stopped once you have quit smoking for at least 15 years or develop a health problem that would prevent you from  having lung cancer treatment.  Fecal occult blood test (FOBT) of stool. / Every year beginning at age 48 and continuing until age 42. You may not have to do this test if you get a colonoscopy every 10 years.  Flexible sigmoidoscopy** or colonoscopy.** / Every 5 years for a flexible sigmoidoscopy or every 10 years for a colonoscopy beginning at age 38 and continuing until age 82.  Hepatitis C blood test.** / For all people born from [birth year] through 1965 and any individual with known risks for hepatitis C.  Skin self-exam. / Monthly.  Influenza vaccine. / Every year.  Tetanus, diphtheria, and acellular pertussis (Tdap/Td) vaccine.** / Consult your health care provider. 1 dose of Td every 10 years.  Varicella vaccine.** / Consult your health care provider.  Zoster vaccine.** / 1 dose for adults aged 64 years or older.  Measles, mumps, rubella (MMR) vaccine.** / You need at least 1 dose of MMR if you were born in 1957 or later. You may also need a second dose.  Pneumococcal 13-valent conjugate (PCV13) vaccine.** / Consult your health care provider.  Pneumococcal polysaccharide (PPSV23) vaccine.** / 1 to 2 doses if you smoke cigarettes or if you have certain conditions.  Meningococcal vaccine.** / Consult your health care provider.  Hepatitis A vaccine.** / Consult your health care provider.  Hepatitis B vaccine.** / Consult your health care provider.  Haemophilus influenzae type b (Hib) vaccine.** / Consult your health care provider. Ages 33 and over  Blood pressure check.** / Every year.  Lipid and cholesterol check.**/ Every 5 years beginning at age 3.  Lung cancer screening. / Every year if you are aged 39-80 years and have a 30-pack-year history of smoking and currently smoke or have quit within the past 15 years. Yearly screening  is stopped once you have quit smoking for at least 15 years or develop a health problem that would prevent you from having lung cancer treatment.  Fecal occult blood test (FOBT) of stool. / Every year beginning at age 38 and continuing until age 8. You may not have to do this test if you get a colonoscopy every 10 years.  Flexible sigmoidoscopy** or colonoscopy.** / Every 5 years for a flexible sigmoidoscopy or every 10 years for a colonoscopy beginning at age 29 and continuing until age 60.  Hepatitis C blood test.** / For all people born from [birth year] through 1965 and any individual with known risks for hepatitis C.  Abdominal aortic aneurysm (AAA) screening.** / A one-time screening for ages 2 to 63 years who are current or former smokers.  Skin self-exam. / Monthly.  Influenza vaccine. / Every year.  Tetanus, diphtheria, and acellular pertussis (Tdap/Td) vaccine.** / 1 dose of Td every 10 years.  Varicella vaccine.** / Consult your health care provider.  Zoster vaccine.** / 1 dose for adults aged 45 years or older.  Pneumococcal 13-valent conjugate (PCV13) vaccine.** / 1 dose for all adults aged 58 years and older.  Pneumococcal polysaccharide (PPSV23) vaccine.** / 1 dose for all adults aged 23 years and older.  Meningococcal vaccine.** / Consult your health care provider.  Hepatitis A vaccine.** / Consult your health care provider.  Hepatitis B vaccine.** / Consult your health care provider.  Haemophilus influenzae type b (Hib) vaccine.** / Consult your health care provider. **Family history and personal history of risk and conditions may change your health care provider's recommendations.   This information is not intended to replace advice given to you by your health care  provider. Make sure you discuss any questions you have with your health care provider.   Document Released: 04/22/2001 Document Revised: 03/17/2014 Document Reviewed: 07/22/2010 Elsevier Interactive Patient  Education [employer].

## 2015-05-02 NOTE — Progress Notes (Signed)
Pre visit review using our clinic review tool, if applicable. No additional management support is needed unless otherwise documented below in the visit note. 

## 2015-06-14 ENCOUNTER — Encounter: Payer: BLUE CROSS/BLUE SHIELD | Admitting: Medical

## 2015-06-14 ENCOUNTER — Telehealth: Payer: Self-pay | Admitting: Medical

## 2015-06-14 NOTE — Progress Notes (Signed)
This encounter was created in error - please disregard.

## 2015-06-15 ENCOUNTER — Encounter: Payer: Self-pay | Admitting: Medical

## 2015-06-15 NOTE — Telephone Encounter (Signed)
No charge. 

## 2015-06-15 NOTE — Telephone Encounter (Signed)
Pt was no show 06/14/15 5:45pm for acute appt, pt has not rescheduled, 1st no show, charge or no charge?

## 2015-06-15 NOTE — Telephone Encounter (Signed)
Waiving fee, mailing reminder letter °

## 2016-02-28 ENCOUNTER — Encounter (HOSPITAL_BASED_OUTPATIENT_CLINIC_OR_DEPARTMENT_OTHER): Payer: Self-pay | Admitting: Emergency Medicine

## 2016-02-28 ENCOUNTER — Observation Stay (HOSPITAL_BASED_OUTPATIENT_CLINIC_OR_DEPARTMENT_OTHER)
Admission: EM | Admit: 2016-02-28 | Discharge: 2016-03-01 | Disposition: A | Discharge status: Home / Self Care | Payer: BLUE CROSS/BLUE SHIELD | Attending: Internal Medicine | Admitting: Internal Medicine

## 2016-02-28 ENCOUNTER — Emergency Department (HOSPITAL_BASED_OUTPATIENT_CLINIC_OR_DEPARTMENT_OTHER): Payer: BLUE CROSS/BLUE SHIELD

## 2016-02-28 DIAGNOSIS — E876 Hypokalemia: Secondary | ICD-10-CM | POA: Diagnosis not present

## 2016-02-28 DIAGNOSIS — I160 Hypertensive urgency: Secondary | ICD-10-CM | POA: Diagnosis not present

## 2016-02-28 DIAGNOSIS — R42 Dizziness and giddiness: Secondary | ICD-10-CM | POA: Diagnosis present

## 2016-02-28 DIAGNOSIS — R748 Abnormal levels of other serum enzymes: Principal | ICD-10-CM | POA: Insufficient documentation

## 2016-02-28 DIAGNOSIS — Z720 Tobacco use: Secondary | ICD-10-CM | POA: Diagnosis present

## 2016-02-28 DIAGNOSIS — F17210 Nicotine dependence, cigarettes, uncomplicated: Secondary | ICD-10-CM | POA: Insufficient documentation

## 2016-02-28 DIAGNOSIS — R778 Other specified abnormalities of plasma proteins: Secondary | ICD-10-CM | POA: Diagnosis present

## 2016-02-28 DIAGNOSIS — I10 Essential (primary) hypertension: Secondary | ICD-10-CM

## 2016-02-28 DIAGNOSIS — R7989 Other specified abnormal findings of blood chemistry: Secondary | ICD-10-CM | POA: Diagnosis present

## 2016-02-28 HISTORY — DX: Hypertensive urgency: I16.0

## 2016-02-28 LAB — CBC WITH DIFFERENTIAL/PLATELET
BASOS ABS: 0 10*3/uL (ref 0.0–0.1)
Basophils Relative: 1 %
EOS PCT: 3 %
Eosinophils Absolute: 0.1 10*3/uL (ref 0.0–0.7)
HCT: 43 % (ref 39.0–52.0)
Hemoglobin: 15.6 g/dL (ref 13.0–17.0)
Lymphocytes Relative: 48 %
Lymphs Abs: 2.5 10*3/uL (ref 0.7–4.0)
MCH: 29.5 pg (ref 26.0–34.0)
MCHC: 36.3 g/dL — ABNORMAL HIGH (ref 30.0–36.0)
MCV: 81.3 fL (ref 78.0–100.0)
Monocytes Absolute: 0.7 10*3/uL (ref 0.1–1.0)
Monocytes Relative: 15 %
Neutro Abs: 1.7 10*3/uL (ref 1.7–7.7)
Neutrophils Relative %: 33 %
PLATELETS: 175 10*3/uL (ref 150–400)
RBC: 5.29 MIL/uL (ref 4.22–5.81)
RDW: 15.6 % — AB (ref 11.5–15.5)
WBC: 5.1 10*3/uL (ref 4.0–10.5)

## 2016-02-28 LAB — BASIC METABOLIC PANEL
ANION GAP: 12 (ref 5–15)
BUN: 10 mg/dL (ref 6–20)
CHLORIDE: 99 mmol/L — AB (ref 101–111)
CO2: 29 mmol/L (ref 22–32)
Calcium: 9.2 mg/dL (ref 8.9–10.3)
Creatinine, Ser: 0.66 mg/dL (ref 0.61–1.24)
GFR calc Af Amer: >60 mL/min (ref >60)
GLUCOSE: 97 mg/dL (ref 65–99)
POTASSIUM: 3.3 mmol/L — AB (ref 3.5–5.1)
Sodium: 140 mmol/L (ref 135–145)

## 2016-02-28 LAB — RAPID URINE DRUG SCREEN, HOSP PERFORMED
AMPHETAMINES: NOT DETECTED
BENZODIAZEPINES: NOT DETECTED
Barbiturates: NOT DETECTED
Cocaine: NOT DETECTED
Opiates: NOT DETECTED
TETRAHYDROCANNABINOL: NOT DETECTED

## 2016-02-28 LAB — TROPONIN I
TROPONIN I: 0.23 ng/mL — AB (ref <0.03)
Troponin I: 0.22 ng/mL (ref <0.03)
Troponin I: 0.26 ng/mL (ref <0.03)

## 2016-02-28 MED ORDER — POTASSIUM CHLORIDE CRYS ER 20 MEQ PO TBCR
40.0000 meq | EXTENDED_RELEASE_TABLET | Freq: Once | ORAL | Status: AC
  Administered 2016-02-28: 40 meq via ORAL
  Filled 2016-02-28: qty 2

## 2016-02-28 MED ORDER — LISINOPRIL 5 MG PO TABS
5.0000 mg | ORAL_TABLET | Freq: Every day | ORAL | Status: DC
  Administered 2016-02-28: 5 mg via ORAL
  Filled 2016-02-28: qty 1

## 2016-02-28 MED ORDER — FLUTICASONE PROPIONATE 50 MCG/ACT NA SUSP
2.0000 | Freq: Every day | NASAL | Status: DC
  Administered 2016-02-28 – 2016-03-01 (×3): 2 via NASAL
  Filled 2016-02-28: qty 16

## 2016-02-28 MED ORDER — LABETALOL HCL 5 MG/ML IV SOLN
10.0000 mg | Freq: Once | INTRAVENOUS | Status: AC
  Administered 2016-02-28: 10 mg via INTRAVENOUS
  Filled 2016-02-28: qty 4

## 2016-02-28 MED ORDER — ENOXAPARIN SODIUM 40 MG/0.4ML ~~LOC~~ SOLN: 40.0000 mg | SUBCUTANEOUS | Status: DC

## 2016-02-28 MED ORDER — ASPIRIN EC 325 MG PO TBEC
325.0000 mg | DELAYED_RELEASE_TABLET | Freq: Once | ORAL | Status: AC
  Administered 2016-02-28: 325 mg via ORAL
  Filled 2016-02-28: qty 1

## 2016-02-28 MED ORDER — LISINOPRIL 5 MG PO TABS: 5.0000 mg | ORAL_TABLET | Freq: Every day | ORAL | Status: DC

## 2016-02-28 MED ORDER — HYDRALAZINE HCL 20 MG/ML IJ SOLN
10.0000 mg | Freq: Four times a day (QID) | INTRAMUSCULAR | Status: DC | PRN
  Administered 2016-02-28: 10 mg via INTRAVENOUS
  Filled 2016-02-28: qty 1

## 2016-02-28 MED ORDER — ACETAMINOPHEN 325 MG PO TABS
650.0000 mg | ORAL_TABLET | ORAL | Status: DC | PRN
Start: 2016-02-28 — End: 2016-03-01

## 2016-02-28 MED ORDER — ONDANSETRON HCL 4 MG/2ML IJ SOLN: 4.0000 mg | Freq: Four times a day (QID) | INTRAMUSCULAR | Status: DC | PRN

## 2016-02-28 MED ORDER — GI COCKTAIL ~~LOC~~: 30.0000 mL | Freq: Four times a day (QID) | ORAL | Status: DC | PRN

## 2016-02-28 MED ORDER — NICOTINE POLACRILEX 2 MG MT GUM
2.0000 mg | CHEWING_GUM | OROMUCOSAL | Status: DC | PRN
Start: 2016-02-28 — End: 2016-03-01
  Administered 2016-02-28: 2 mg via ORAL
  Filled 2016-02-28 (×2): qty 1

## 2016-02-28 MED ORDER — ACETAMINOPHEN 325 MG PO TABS
650.0000 mg | ORAL_TABLET | Freq: Once | ORAL | Status: AC
  Administered 2016-02-28: 650 mg via ORAL
  Filled 2016-02-28: qty 2

## 2016-02-28 NOTE — ED Notes (Signed)
ED Provider at bedside. 

## 2016-02-28 NOTE — Progress Notes (Signed)
CRITICAL VALUE ALERT  Critical value received:  Troponin 0.26  Date of notification:  12/21/172  Time of notification:  2200  Critical value read back:Yes.    Nurse who received alert:  Merry Proudeborah Keonta Monceaux, RN  MD notified (1st page):  Maren ReamerKaren Kirby, NP  Time of first page:  2215  MD notified (2nd page):  Time of second page:  Responding MD:  Maren ReamerKaren Kirby, NP  Time MD responded:  2230

## 2016-02-28 NOTE — H&P (Signed)
History and Physical    Samuel Cantrell JOA:[id number] DOB: 1975-09-16 DOA: 02/28/2016   PCP: Mackie Pai, PA-C   Patient coming from/Resides with: Private residence/lives with wife  Admission status: Observation/telemetry -medically necessary to stay a minimum 2 midnights to rule out impending and/or unexpected changes in physiologic status that may differ from initial evaluation performed in the ER and/or at time of admission.   Chief Complaint: Dizziness and new diagnosis hypertension  HPI: Samuel Cantrell is a 40 y.o. male with medical history significant for tobacco abuse and no prior history of hypertension. Patient reports this past Monday he began developing dizziness which became more constant. He said his eyes "felt funny". He performs a physical labor job and the dizziness would worsen when he would lean down to pick up tools. He has not had any chest pain. Over the past 6 months he has had right arm sharp pains with tingling in the fingertips. He denies previous injury to the arm or neck. There is no correlation between the arm pain and a recent dizziness.  ED Course:  Vital Signs: BP 158/96   Pulse 90   Temp 99.2 F (37.3 C) (Oral)   Resp 14   Ht _0  (1.702 m)   Wt 65.8 kg (145 lb)   SpO2 99%   BMI 22.71 kg/m  2 view chest x-ray: No acute cardiopulmonary disease Lab data: Sodium 140, potassium 3.3, chloride 99, BUN 10, creatinine 0.66, anion gap 12, troponin 0.22 and 0.23, white count 5100 with normal differential, hemoglobin 15.6, platelets 175,000, urine drug screen negative Medications and treatments: Aspirin 325 mg 1, labetalol 10 mg IV 1, Tylenol 650 mg 1  Review of Systems:  In addition to the HPI above,  No Fever-chills, myalgias or other constitutional symptoms No Headache, changes with Vision or hearing, new weakness, tingling, numbness in any extremity, dysarthria or word finding difficulty, gait disturbance or imbalance, tremors or seizure  activity No problems swallowing food or Liquids, indigestion/reflux, choking or coughing while eating, abdominal pain with or after eating No Chest pain, Cough or Shortness of Breath, palpitations, orthopnea or DOE No Abdominal pain, N/V, melena,hematochezia, dark tarry stools, constipation No dysuria, malodorous urine, hematuria or flank pain No new skin rashes, lesions, masses or bruises, No new joint pains, aches, swelling or redness No recent unintentional weight gain or loss No polyuria, polydypsia or polyphagia   Past Medical History:  Diagnosis Date  . Allergy   . GERD (gastroesophageal reflux disease)   . Hypertensive urgency 02/28/2016    History reviewed. No pertinent surgical history.  Social History   Social History  . Marital status: Married    Spouse name: N/A  . Number of children: N/A  . Years of education: N/A   Occupational History  . Not on file.   Social History Main Topics  . Smoking status: Current Every Day Smoker    Packs/day: 0.50    Years: 8.00    Types: Cigarettes  . Smokeless tobacco: Never Used  . Alcohol use Yes     Comment: every other night 2 beers  . Drug use: No  . Sexual activity: Yes   Other Topics Concern  . Not on file   Social History Narrative  . No narrative on file    Mobility: Without assistive devices Work history: Musician   No Known Allergies  Family History  Problem Relation Age of Onset  . Hypertension Mother      Prior to Admission  medications   Medication Sig Start Date End Date Taking? Authorizing Provider  diphenhydrAMINE (BENADRYL) 25 MG tablet Take 25 mg by mouth every 6 (six) hours as needed for itching or allergies.   Yes Historical Provider, MD  ibuprofen (ADVIL,MOTRIN) 200 MG tablet Take 400 mg by mouth every 6 (six) hours as needed for moderate pain.    Yes Historical Provider, MD    Physical Exam: Vitals:   02/28/16 1400 02/28/16 1443 02/28/16 1445 02/28/16 1625  BP: (!) 165/102   158/96   Pulse: 89  90   Resp: 17  14   Temp:  98.3 F (36.8 C)  99.2 F (37.3 C)  TempSrc:  Oral  Oral  SpO2: 99%  98% 99%  Weight:      Height:          Constitutional: NAD, calm, comfortable Eyes: PERRL, lids and conjunctivae normal ENMT: Mucous membranes are moist. Posterior pharynx clear of any exudate or lesions.Normal dentition.  Neck: normal, supple, no masses, no thyromegaly Respiratory: clear to auscultation bilaterally, no wheezing, no crackles. Normal respiratory effort. No accessory muscle use.  Cardiovascular: Regular rate and rhythm, no murmurs / rubs / gallops. No extremity edema. 2+ pedal pulses. No carotid bruits.  Abdomen: no tenderness, no masses palpated. No hepatosplenomegaly. Bowel sounds positive.  Musculoskeletal: no clubbing / cyanosis. No joint deformity upper and lower extremities. Good ROM, no contractures. Normal muscle tone.  Skin: no rashes, lesions, ulcers. No induration Neurologic: CN 2-12 grossly intact. Sensation intact, DTR normal. Strength 5/5 x all 4 extremities.  Psychiatric: Normal judgment and insight. Alert and oriented x 3. Normal mood.    Labs on Admission: I have personally reviewed following labs and imaging studies  CBC:  Recent Labs Lab 02/28/16 0930  WBC 5.1  NEUTROABS 1.7  HGB 15.6  HCT 43.0  MCV 81.3  PLT 253   Basic Metabolic Panel:  Recent Labs Lab 02/28/16 0930  NA 140  K 3.3*  CL 99*  CO2 29  GLUCOSE 97  BUN 10  CREATININE 0.66  CALCIUM 9.2   GFR: Estimated Creatinine Clearance: 112 mL/min (by C-G formula based on SCr of 0.66 mg/dL). Liver Function Tests: No results for input(s): AST, ALT, ALKPHOS, BILITOT, PROT, ALBUMIN in the last 168 hours. No results for input(s): LIPASE, AMYLASE in the last 168 hours. No results for input(s): AMMONIA in the last 168 hours. Coagulation Profile: No results for input(s): INR, PROTIME in the last 168 hours. Cardiac Enzymes:  Recent Labs Lab 02/28/16 0930  02/28/16 1409  TROPONINI 0.22* 0.23*   BNP (last 3 results) No results for input(s): PROBNP in the last 8760 hours. HbA1C: No results for input(s): HGBA1C in the last 72 hours. CBG: No results for input(s): GLUCAP in the last 168 hours. Lipid Profile: No results for input(s): CHOL, HDL, LDLCALC, TRIG, CHOLHDL, LDLDIRECT in the last 72 hours. Thyroid Function Tests: No results for input(s): TSH, T4TOTAL, FREET4, T3FREE, THYROIDAB in the last 72 hours. Anemia Panel: No results for input(s): VITAMINB12, FOLATE, FERRITIN, TIBC, IRON, RETICCTPCT in the last 72 hours. Urine analysis:    Component Value Date/Time   BILIRUBINUR neg 05/02/2015 0846   PROTEINUR neg 05/02/2015 0846   UROBILINOGEN 0.2 05/02/2015 0846   NITRITE neg 05/02/2015 0846   LEUKOCYTESUR Negative 05/02/2015 0846   Sepsis Labs: _0 (procalcitonin:4,lacticidven:4) )No results found for this or any previous visit (from the past 240 hour(s)).   Radiological Exams on Admission: Dg Chest 2 View  Result Date: 02/28/2016  CLINICAL DATA:  Weakness and fever for 3 days.  Smoker. EXAM: CHEST  2 VIEW COMPARISON:  None FINDINGS: The heart size and mediastinal contours are within normal limits. Both lungs are clear. The visualized skeletal structures are unremarkable. IMPRESSION: No active cardiopulmonary disease. Electronically Signed   By: Kerby Moors M.D.   On: 02/28/2016 09:31    EKG: (Independently reviewed) sinus rhythm with ventricular rate 89 bpm, QTC 464 ms, voltage criteria met for LVH, no definitive ischemic changes, mild elevated J point and lateral leads  Assessment/Plan Principal Problem:   Elevated troponin -Patient presents with dizziness and new-onset hypertension without reports of chest pain -Troponin mildly positive with flat trend 2 collections so we'll repeat 1 -Echocardiogram -Heart score equals 2  Active Problems:   Hypertensive urgency -Blood pressure has improved but not  well-controlled after 1 dose of labetalol -Begin lisinopril 5 mg daily -prn IV Hydralazine -Cardiovascular screening with lipid panel -Patient drinks 2 beers daily; counseled regarding decreasing usage since can contribute to hypertension -Renal function is normal    Acute hypokalemia -Replace and repeat electrolyte panel in a.m.    Tobacco abuse -Counseled regarding cessation noting smokes one half pack per day -Declines nicotine patch      DVT prophylaxis: Lovenox Code Status: Full Family Communication: wife at bedside Disposition Plan: Anticipate discharge back to preadmission home environment once medically stable Consults called: None     Raydan Schlabach L. ANP-BC [facility] Pager [phone]   If 7PM-7AM, please contact night-coverage [email or web address] Password [facility]  02/28/2016, 5:34 PM

## 2016-02-28 NOTE — Progress Notes (Signed)
      Patient coming for treatment of elevated troponin secondary to hypertensive urgency. Patient is not on any blood pressure medications at baseline. Symptoms may been started off by a viral illness. Patient sent to telemetry bed under observation status on a [facility] service.  Shelly Flattenavid Caelie Remsburg, MD [facility] 02/28/2016, 11:33 AM

## 2016-02-28 NOTE — ED Triage Notes (Addendum)
Pt c/o not feeling well for the past few days. Not sure if he's had a fever. Taking ibuprofen. Denies chx pain,. cough or cold symptoms.

## 2016-02-28 NOTE — Progress Notes (Signed)
Pt educated & informed that he was in a safety camera room pt stated he was okay with staying in the room with the camera on. Sanda LingerMilam, Shaye Elling R, RN

## 2016-02-28 NOTE — ED Notes (Signed)
ED MD informed of 2nd troponin at 0.23

## 2016-02-28 NOTE — ED Provider Notes (Signed)
[facility] Provider Note   CSN: [id number] Arrival date & time: 02/28/16  [id number]     History   Chief Complaint Chief Complaint  Patient presents with  . Dizziness    HPI Samuel Cantrell is a 40 y.o. male.  HPI Patient presents after some dizziness. States he hasn't felt well for the last few days. States he bent over to pick up something at work and he began to feel lightheaded. No fevers. No change in appetite. Has been taking Motrin. No headache. No chest pain. No trouble breathing. No reported history of hypertension. No black or bloody stools. No chest pain. States he take his Motrin and Benadryl at home and slept all day.   Past Medical History:  Diagnosis Date  . Allergy   . GERD (gastroesophageal reflux disease)     Patient Active Problem List   Diagnosis Date Noted  . Wellness examination 05/02/2015  . Allergic rhinitis 04/10/2015  . Smoker 04/10/2015    History reviewed. No pertinent surgical history.     Home Medications    Prior to Admission medications   Medication Sig Start Date End Date Taking? Authorizing Provider  DiphenhydrAMINE HCl (BENADRYL ALLERGY PO) Take by mouth as needed.   Yes Historical Provider, MD  fluticasone (FLONASE) 50 MCG/ACT nasal spray Place 2 sprays into both nostrils daily. 04/10/15   Esperanza RichtersEdward Saguier, PA-C    Family History Family History  Problem Relation Age of Onset  . Hypertension Mother     Social History Social History  Substance Use Topics  . Smoking status: Current Every Day Smoker    Packs/day: 0.50    Years: 8.00    Types: Cigarettes  . Smokeless tobacco: Never Used  . Alcohol use Yes     Comment: every other night 2 beers     Allergies   Patient has no known allergies.   Review of Systems Review of Systems  Constitutional: Positive for fatigue. Negative for appetite change.  HENT: Negative for congestion and ear pain.   Respiratory: Negative for shortness of breath.     Cardiovascular: Negative for chest pain.  Gastrointestinal: Negative for abdominal pain.  Genitourinary: Negative for flank pain.  Musculoskeletal: Negative for arthralgias.  Neurological: Negative for seizures.  Psychiatric/Behavioral: Negative for confusion.     Physical Exam Updated Vital Signs BP (!) 169/103   Pulse 93   Temp 98.2 F (36.8 C) (Oral)   Resp 12   Ht 5\' 7"  (1.702 m)   Wt 145 lb (65.8 kg)   SpO2 98%   BMI 22.71 kg/m   Physical Exam  Constitutional: He appears well-developed.  HENT:  Head: Atraumatic.  Mouth/Throat: No oropharyngeal exudate.  Neck: Neck supple.  Cardiovascular:  Tachycardia. States he always has a fast heart rate.  Pulmonary/Chest: Effort normal.  Abdominal: Soft. There is no tenderness.  Musculoskeletal: He exhibits no edema.  Neurological: He is alert.  Skin: Skin is warm. Capillary refill takes less than 2 seconds.     ED Treatments / Results  Labs (all labs ordered are listed, but only abnormal results are displayed) Labs Reviewed  CBC WITH DIFFERENTIAL/PLATELET - Abnormal; Notable for the following:       Result Value   MCHC 36.3 (*)    RDW 15.6 (*)    All other components within normal limits  TROPONIN I - Abnormal; Notable for the following:    Troponin I 0.22 (*)    All other components within normal limits  BASIC  METABOLIC PANEL - Abnormal; Notable for the following:    Potassium 3.3 (*)    Chloride 99 (*)    All other components within normal limits  RAPID URINE DRUG SCREEN, HOSP PERFORMED    EKG  EKG Interpretation  Date/Time:  Thursday February 28 2016 09:39:46 EST Ventricular Rate:  97 PR Interval:    QRS Duration: 89 QT Interval:  363 QTC Calculation: 462 R Axis:   84 Text Interpretation:  Sinus rhythm RSR' in V1 or V2, probably normal variant Left ventricular hypertrophy Baseline wander in lead(s) V2 V3 V4 V5 V6 Confirmed by Rubin PayorPICKERING  MD, Lavenia Stumpo [phone]([id number]) on 02/28/2016 10:25:40 AM        Radiology Dg Chest 2 View  Result Date: 02/28/2016 CLINICAL DATA:  Weakness and fever for 3 days.  Smoker. EXAM: CHEST  2 VIEW COMPARISON:  None FINDINGS: The heart size and mediastinal contours are within normal limits. Both lungs are clear. The visualized skeletal structures are unremarkable. IMPRESSION: No active cardiopulmonary disease. Electronically Signed   By: Signa Kellaylor  Stroud M.D.   On: 02/28/2016 09:31    Procedures Procedures (including critical care time)  Medications Ordered in ED Medications  aspirin EC tablet 325 mg (not administered)     Initial Impression / Assessment and Plan / ED Course  I have reviewed the triage vital signs and the nursing notes.  Pertinent labs & imaging results that were available during my care of the patient were reviewed by me and considered in my medical decision making (see chart for details).  Clinical Course     Patient presents with feeling bad for last few days. Monday states he bent over and felt bad/dizzy. Found to be hypertensive. No chest pain. States that he did have occasional tingling in his right arm that went down to his fourth and fifth finger. No weakness. No nausea vomiting. Denies drug use. Found to have mildly elevated troponin and nonspecific T-wave changes on EKG. Will admit to internal medicine for further monitoring. Aspirin given.  Final Clinical Impressions(s) / ED Diagnoses   Final diagnoses:  Elevated troponin  Dizziness  Hypertension, unspecified type    New Prescriptions New Prescriptions   No medications on file     Benjiman CoreNathan Felicha Frayne, MD 02/28/16 1031

## 2016-02-28 NOTE — ED Notes (Addendum)
Patient c/o of pain and aching to right arm, 6/10, for ?months , ED MD informed

## 2016-02-29 ENCOUNTER — Observation Stay (HOSPITAL_BASED_OUTPATIENT_CLINIC_OR_DEPARTMENT_OTHER): Payer: BLUE CROSS/BLUE SHIELD

## 2016-02-29 ENCOUNTER — Encounter (HOSPITAL_COMMUNITY): Payer: Self-pay | Admitting: Physician Assistant

## 2016-02-29 DIAGNOSIS — R9431 Abnormal electrocardiogram [ECG] [EKG]: Secondary | ICD-10-CM

## 2016-02-29 DIAGNOSIS — E876 Hypokalemia: Secondary | ICD-10-CM | POA: Diagnosis not present

## 2016-02-29 DIAGNOSIS — I160 Hypertensive urgency: Secondary | ICD-10-CM | POA: Diagnosis not present

## 2016-02-29 DIAGNOSIS — Z720 Tobacco use: Secondary | ICD-10-CM | POA: Diagnosis not present

## 2016-02-29 DIAGNOSIS — R748 Abnormal levels of other serum enzymes: Secondary | ICD-10-CM | POA: Diagnosis not present

## 2016-02-29 LAB — ECHOCARDIOGRAM COMPLETE
Height: 68 in
WEIGHTICAEL: 2011.2 [oz_av]

## 2016-02-29 LAB — BASIC METABOLIC PANEL
ANION GAP: 9 (ref 5–15)
BUN: 7 mg/dL (ref 6–20)
CALCIUM: 9.4 mg/dL (ref 8.9–10.3)
CO2: 30 mmol/L (ref 22–32)
Chloride: 99 mmol/L — ABNORMAL LOW (ref 101–111)
Creatinine, Ser: 0.79 mg/dL (ref 0.61–1.24)
Glucose, Bld: 103 mg/dL — ABNORMAL HIGH (ref 65–99)
Potassium: 3.9 mmol/L (ref 3.5–5.1)
SODIUM: 138 mmol/L (ref 135–145)

## 2016-02-29 LAB — LIPID PANEL
CHOLESTEROL: 179 mg/dL (ref 0–200)
HDL: 87 mg/dL (ref >40)
LDL CALC: 80 mg/dL (ref 0–99)
Total CHOL/HDL Ratio: 2.1 RATIO
Triglycerides: 60 mg/dL (ref <150)
VLDL: 12 mg/dL (ref 0–40)

## 2016-02-29 MED ORDER — LISINOPRIL 20 MG PO TABS
20.0000 mg | ORAL_TABLET | Freq: Every day | ORAL | Status: DC
  Administered 2016-02-29 – 2016-03-01 (×2): 20 mg via ORAL
  Filled 2016-02-29 (×2): qty 1

## 2016-02-29 MED ORDER — AMLODIPINE BESYLATE 10 MG PO TABS
10.0000 mg | ORAL_TABLET | Freq: Every day | ORAL | Status: DC
  Administered 2016-02-29: 10 mg via ORAL
  Filled 2016-02-29: qty 1

## 2016-02-29 NOTE — Consult Note (Signed)
CARDIOLOGY CONSULT NOTE   Patient ID: Samuel Cantrell MRN: [id number] DOB/AGE: [birth year] 40 y.o.  Admit date: 02/28/2016  Primary Physician   Saguier, Kateri McEdward, PA-C Primary Cardiologist   New Reason for Consultation   Elevated troponin Requesting MD: Dr Blake DivineAkula  WUJ:[id number]:[name] is a 40 y.o. year old male with a history of HTN, GERD, tob use.  Pt admitted 12/21 w/ HTN urgency and chest pain, trop elevated, cards asked to see.  Pt walks a great deal at work, probably several miles/day. He never gets CP with exertion. No DOE, no recent illnesses. BP followed by PCP, pt has not been told that his BP was uncontrolled. Sees them once a year.  Monday, pt not feeling well. A little dizzy and eyes were bloodshot. Was able to work but did not feel well. Tuesday, called in and took AlkaSeltzer cold and ibuprofen>>no change in sx. Slept a lot. Thurs still felt shaky and came to ER. Pt BP has been very high since admit. BP 175/111 initially, not much lower now.   Only other ongoing issue is R arm pain at times, mostly at rest. Sometimes fingers will get tingly.  Past Medical History:  Diagnosis Date  . Allergy   . GERD (gastroesophageal reflux disease)   . Hypertensive urgency 02/28/2016     Past Surgical History:  Procedure Laterality Date  . NO PAST SURGERIES      No Known Allergies  I have reviewed the patient's current medications . enoxaparin (LOVENOX) injection  40 mg Subcutaneous Q24H  . fluticasone  2 spray Each Nare Daily  . lisinopril  5 mg Oral Daily    acetaminophen, gi cocktail, hydrALAZINE, nicotine polacrilex, ondansetron (ZOFRAN) IV  Prior to Admission medications   Medication Sig Start Date End Date Taking? Authorizing Provider  diphenhydrAMINE (BENADRYL) 25 MG tablet Take 25 mg by mouth every 6 (six) hours as needed for itching or allergies.   Yes Historical Provider, MD  ibuprofen (ADVIL,MOTRIN) 200 MG tablet Take 400 mg by mouth every 6 (six)  hours as needed for moderate pain.    Yes Historical Provider, MD     Social History   Social History  . Marital status: Married    Spouse name: N/A  . Number of children: N/A  . Years of education: N/A   Occupational History  . Not on file.   Social History Main Topics  . Smoking status: Current Every Day Smoker    Packs/day: 0.50    Years: 8.00    Types: Cigarettes  . Smokeless tobacco: Never Used  . Alcohol use Yes     Comment: every other night 2 beers  . Drug use: No  . Sexual activity: Yes   Other Topics Concern  . Not on file   Social History Narrative  . No narrative on file    Family Status  Relation Status  . Mother Alive  . Father Alive   Family History  Problem Relation Age of Onset  . Hypertension Mother      ROS:  Full 14 point review of systems complete and found to be negative unless listed above.  Physical Exam: Blood pressure (!) 166/89, pulse 76, temperature 98 F (36.7 C), temperature source Oral, resp. rate 14, height 5\' 8"  (1.727 m), weight 125 lb 11.2 oz (57 kg), SpO2 98 %.  General: Well developed, well nourished, male in no acute distress Head: Eyes PERRLA, No xanthomas.   Normocephalic and atraumatic, oropharynx  without edema or exudate. Dentition: good Lungs: clear bilaterally Heart: HRRR S1 S2, no rub/gallop, no murmur. pulses are 2+ all 4 extrem.   Neck: No carotid bruits. No lymphadenopathy.  JVD not elevated Abdomen: Bowel sounds present, abdomen soft and non-tender without masses or hernias noted. Msk:  No spine or cva tenderness. No weakness, no joint deformities or effusions. Extremities: No clubbing or cyanosis. No edema.  Neuro: Alert and oriented X 3. No focal deficits noted. Psych:  Good affect, responds appropriately Skin: No rashes or lesions noted.  Labs:   Lab Results  Component Value Date   WBC 5.1 02/28/2016   HGB 15.6 02/28/2016   HCT 43.0 02/28/2016   MCV 81.3 02/28/2016   PLT 175 02/28/2016    Recent  Labs Lab 02/29/16 0457  NA 138  K 3.9  CL 99*  CO2 30  BUN 7  CREATININE 0.79  CALCIUM 9.4  GLUCOSE 103*   No results found for: MG  Recent Labs  02/28/16 0930 02/28/16 1409 02/28/16 2040  TROPONINI 0.22* 0.23* 0.26*   Lab Results  Component Value Date   CHOL 179 02/29/2016   HDL 87 02/29/2016   LDLCALC 80 02/29/2016   TRIG 60 02/29/2016   TSH  Date/Time Value Ref Range Status  05/02/2015 08:45 AM 1.22 0.35 - 4.50 uIU/mL Final   Echo: ordered  ECG:  12/21 SR, +LVH, NO OLD  Cath: n/a  Radiology:  Dg Chest 2 View  Result Date: 02/28/2016 CLINICAL DATA:  Weakness and fever for 3 days.  Smoker. EXAM: CHEST  2 VIEW COMPARISON:  None FINDINGS: The heart size and mediastinal contours are within normal limits. Both lungs are clear. The visualized skeletal structures are unremarkable. IMPRESSION: No active cardiopulmonary disease. Electronically Signed   By: Signa Kellaylor  Stroud M.D.   On: 02/28/2016 09:31    ASSESSMENT AND PLAN:   The patient was seen today by Dr Eden EmmsNishan, the patient evaluated and the data reviewed.   Principal Problem: 1.  Elevated troponin - pt with no ischemic sx and good exercise tolerance. - troponin is mildly elevated but flat, in the setting of HTN urgency - echo ordered - MD advise further eval.  Otherwise, per IM Active Problems:   Hypertensive urgency   Tobacco abuse   Acute hypokalemia   Signed: Leanna BattlesBarrett, Rhonda, PA-C 02/29/2016 8:03 AM Beeper [phone]  Co-Sign MD  Patient examined chart reviewed. Active with no chest pain Admitted with HTN urgency Exam with S4 gallop only. No renal bruit clear lungs no murmur. Troponin elevated With no evolution ECG with borderline LVH no strain. Will increase lisinopril to 20 mg And add norvasc 10 mg Tentatively order ETT in am   Charlton HawsPeter Leshonda Galambos

## 2016-02-29 NOTE — Progress Notes (Signed)
PROGRESS NOTE    Samuel DammeSharif L [place]cheltree  LKG:[id number] DOB: 1975/05/30 DOA: 02/28/2016 PCP: Esperanza RichtersSaguier, Edward, PA-C    Brief Narrative: Samuel Cantrell is a 40 y.o. male with medical history significant for tobacco abuse and no prior history of hypertension, presents with dizziness , was found to be in hypertensive emergency with elevated troponins. He was admitted to medical service and cardiology consulted. He is scheduled for ETT in am.   Assessment & Plan:   Principal Problem:   Elevated troponin Active Problems:   Hypertensive urgency   Tobacco abuse   Acute hypokalemia   Accelerated Hypertension:  bp parameters much better today with meds.  No headache, no chest pain.    Elevated troponins suspect from the above, plan for ETT as per cardiology in am.    Tobacco abuse: on patch.    Hypokalemia: repleted.   Right arm pain: intermittent.  No swelling or signs of cellulitis.  ? Neuropathic pain.  Will monitor.     DVT prophylaxis: (Lovenox) Code Status: (Full) Family Communication: family at bedside.  Disposition Plan: pending further work up in am.    Consultants:   Cardiology.    Procedures: ETT in am.    Antimicrobials: none.    Subjective: Reports shooting pain in the right arm since 4 months. Intermittent.  No chest pain or sob.   Objective: Vitals:   02/28/16 2140 02/29/16 0618 02/29/16 1001 02/29/16 1339  BP: (!) 142/87 (!) 166/89 (!) 143/107 (!) 145/92  Pulse: 86 76  90  Resp: 15 14  13   Temp: 98.6 F (37 C) 98 F (36.7 C)  98.4 F (36.9 C)  TempSrc: Oral Oral  Oral  SpO2: 98% 98%  100%  Weight:  57 kg (125 lb 11.2 oz)    Height:        Intake/Output Summary (Last 24 hours) at 02/29/16 1556 Last data filed at 02/28/16 1900  Gross per 24 hour  Intake              300 ml  Output                2 ml  Net              298 ml   Filed Weights   02/28/16 0843 02/28/16 1505 02/29/16 0618  Weight: 65.8 kg (145 lb) 58.2 kg (128 lb  3.2 oz) 57 kg (125 lb 11.2 oz)    Examination:  General exam: Appears calm and comfortable  Respiratory system: Clear to auscultation. Respiratory effort normal. Cardiovascular system: S1 & S2 heard, RRR. No JVD, murmurs, rubs, gallops or clicks. No pedal edema. Gastrointestinal system: Abdomen is nondistended, soft and nontender. No organomegaly or masses felt. Normal bowel sounds heard. Central nervous system: Alert and oriented. No focal neurological deficits. Extremities: Symmetric 5 x 5 power. Skin: No rashes, lesions or ulcers Psychiatry: Judgement and insight appear normal. Mood & affect appropriate.     Data Reviewed: I have personally reviewed following labs and imaging studies  CBC:  Recent Labs Lab 02/28/16 0930  WBC 5.1  NEUTROABS 1.7  HGB 15.6  HCT 43.0  MCV 81.3  PLT 175   Basic Metabolic Panel:  Recent Labs Lab 02/28/16 0930 02/29/16 0457  NA 140 138  K 3.3* 3.9  CL 99* 99*  CO2 29 30  GLUCOSE 97 103*  BUN 10 7  CREATININE 0.66 0.79  CALCIUM 9.2 9.4   GFR: Estimated Creatinine Clearance: 97 mL/min (by C-G  formula based on SCr of 0.79 mg/dL). Liver Function Tests: No results for input(s): AST, ALT, ALKPHOS, BILITOT, PROT, ALBUMIN in the last 168 hours. No results for input(s): LIPASE, AMYLASE in the last 168 hours. No results for input(s): AMMONIA in the last 168 hours. Coagulation Profile: No results for input(s): INR, PROTIME in the last 168 hours. Cardiac Enzymes:  Recent Labs Lab 02/28/16 0930 02/28/16 1409 02/28/16 2040  TROPONINI 0.22* 0.23* 0.26*   BNP (last 3 results) No results for input(s): PROBNP in the last 8760 hours. HbA1C: No results for input(s): HGBA1C in the last 72 hours. CBG: No results for input(s): GLUCAP in the last 168 hours. Lipid Profile:  Recent Labs  02/29/16 0457  CHOL 179  HDL 87  LDLCALC 80  TRIG 60  CHOLHDL 2.1   Thyroid Function Tests: No results for input(s): TSH, T4TOTAL, FREET4, T3FREE,  THYROIDAB in the last 72 hours. Anemia Panel: No results for input(s): VITAMINB12, FOLATE, FERRITIN, TIBC, IRON, RETICCTPCT in the last 72 hours. Sepsis Labs: No results for input(s): PROCALCITON, LATICACIDVEN in the last 168 hours.  No results found for this or any previous visit (from the past 240 hour(s)).       Radiology Studies: Dg Chest 2 View  Result Date: 02/28/2016 CLINICAL DATA:  Weakness and fever for 3 days.  Smoker. EXAM: CHEST  2 VIEW COMPARISON:  None FINDINGS: The heart size and mediastinal contours are within normal limits. Both lungs are clear. The visualized skeletal structures are unremarkable. IMPRESSION: No active cardiopulmonary disease. Electronically Signed   By: Signa Kellaylor  Stroud M.D.   On: 02/28/2016 09:31        Scheduled Meds: . amLODipine  10 mg Oral Daily  . enoxaparin (LOVENOX) injection  40 mg Subcutaneous Q24H  . fluticasone  2 spray Each Nare Daily  . lisinopril  20 mg Oral Daily   Continuous Infusions:   LOS: 0 days    Time spent: 35 minutes.     Kathlen ModyAKULA,Braeden Dolinski, MD [facility] Pager [phone]   If 7PM-7AM, please contact night-coverage [email or web address] Password [email or web address] 02/29/2016, 3:56 PM

## 2016-03-01 ENCOUNTER — Observation Stay (HOSPITAL_BASED_OUTPATIENT_CLINIC_OR_DEPARTMENT_OTHER): Payer: BLUE CROSS/BLUE SHIELD

## 2016-03-01 ENCOUNTER — Other Ambulatory Visit (HOSPITAL_COMMUNITY): Payer: Self-pay | Admitting: *Deleted

## 2016-03-01 DIAGNOSIS — R748 Abnormal levels of other serum enzymes: Secondary | ICD-10-CM | POA: Diagnosis not present

## 2016-03-01 DIAGNOSIS — Z720 Tobacco use: Secondary | ICD-10-CM | POA: Diagnosis not present

## 2016-03-01 DIAGNOSIS — I160 Hypertensive urgency: Secondary | ICD-10-CM | POA: Diagnosis not present

## 2016-03-01 DIAGNOSIS — E876 Hypokalemia: Secondary | ICD-10-CM | POA: Diagnosis not present

## 2016-03-01 LAB — EXERCISE TOLERANCE TEST
CHL RATE OF PERCEIVED EXERTION: 5
CSEPED: 6 min
CSEPEW: 7 METS
CSEPHR: 97 %
Exercise duration (sec): 0 s
MPHR: 178 {beats}/min
Peak HR: 173 {beats}/min
Rest HR: 107 {beats}/min

## 2016-03-01 LAB — HEMOGLOBIN A1C
Hgb A1c MFr Bld: 5.5 % (ref 4.8–5.6)
MEAN PLASMA GLUCOSE: 111 mg/dL

## 2016-03-01 LAB — TSH: TSH: 1.948 u[IU]/mL (ref 0.350–4.500)

## 2016-03-01 MED ORDER — AMLODIPINE BESYLATE 10 MG PO TABS: 10.0000 mg | ORAL_TABLET | Freq: Every day | ORAL | 0 refills | Status: DC

## 2016-03-01 MED ORDER — CARVEDILOL 12.5 MG PO TABS
12.5000 mg | ORAL_TABLET | Freq: Two times a day (BID) | ORAL | Status: DC
  Administered 2016-03-01: 12.5 mg via ORAL
  Filled 2016-03-01: qty 1

## 2016-03-01 MED ORDER — CARVEDILOL 12.5 MG PO TABS: 12.5000 mg | ORAL_TABLET | Freq: Two times a day (BID) | ORAL | 0 refills | Status: DC

## 2016-03-01 MED ORDER — LISINOPRIL 20 MG PO TABS: 20.0000 mg | ORAL_TABLET | Freq: Every day | ORAL | 0 refills | Status: DC

## 2016-03-01 NOTE — Progress Notes (Addendum)
Patient Name: Samuel Cantrell Date of Encounter: 03/01/2016  Primary Cardiologist: Dr. Christeen DouglasNishan   Hospital Problem List     Principal Problem:   Elevated troponin Active Problems:   Hypertensive urgency   Tobacco abuse   Acute hypokalemia   Subjective   No complaints   Inpatient Medications    Scheduled Meds: . amLODipine  10 mg Oral Daily  . enoxaparin (LOVENOX) injection  40 mg Subcutaneous Q24H  . fluticasone  2 spray Each Nare Daily  . lisinopril  20 mg Oral Daily   Continuous Infusions:  PRN Meds: acetaminophen, gi cocktail, hydrALAZINE, nicotine polacrilex, ondansetron (ZOFRAN) IV   Vital Signs    Vitals:   02/29/16 1001 02/29/16 1339 02/29/16 2020 03/01/16 0500  BP: (!) 143/107 (!) 145/92 (!) 152/88 (!) 152/97  Pulse:  90 85 95  Resp:  13 14 16   Temp:  98.4 F (36.9 C) 98.2 F (36.8 C) 98.2 F (36.8 C)  TempSrc:  Oral    SpO2:  100% 100% 100%  Weight:    124 lb (56.2 kg)  Height:        Intake/Output Summary (Last 24 hours) at 03/01/16 0957 Last data filed at 03/01/16 0500  Gross per 24 hour  Intake              480 ml  Output                2 ml  Net              478 ml   Filed Weights   02/28/16 1505 02/29/16 0618 03/01/16 0500  Weight: 128 lb 3.2 oz (58.2 kg) 125 lb 11.2 oz (57 kg) 124 lb (56.2 kg)    Physical Exam   GEN: Well nourished,  in no acute distress.  HEENT: normocephalic, sclera clear, mucus membranes moist.  Neck: Supple, no JVD Cardiac: RRR, no murmurs, rubs, or gallops. No clubbing, cyanosis, edema.  Radials/DP/PT 2+ and equal bilaterally.  Respiratory:  Respirations regular and unlabored, clear to auscultation bilaterally without rales, rhonchi or wheezes. GI: Abd -Soft, nontender, nondistended, BS + x 4. MS: no deformity or atrophy. Skin: warm and dry, brisk capillary refill, no obvious rash Neuro:  Alert and oriented X 3 MAE, follows commands Psych: answers questions appropriately,Normal and pleasant  affect.   Labs    CBC  Recent Labs  02/28/16 0930  WBC 5.1  NEUTROABS 1.7  HGB 15.6  HCT 43.0  MCV 81.3  PLT 175   Basic Metabolic Panel  Recent Labs  02/28/16 0930 02/29/16 0457  NA 140 138  K 3.3* 3.9  CL 99* 99*  CO2 29 30  GLUCOSE 97 103*  BUN 10 7  CREATININE 0.66 0.79  CALCIUM 9.2 9.4    Recent Labs  02/28/16 0930 02/28/16 1409 02/28/16 2040  TROPONINI 0.22* 0.23* 0.26*   BNP Invalid input(s): POCBNP D-Dimer No results for input(s): DDIMER in the last 72 hours. Hemoglobin A1C  Recent Labs  02/29/16 1630  HGBA1C 5.5   Fasting Lipid Panel  Recent Labs  02/29/16 0457  CHOL 179  HDL 87  LDLCALC 80  TRIG 60  CHOLHDL 2.1   Thyroid Function Tests  Recent Labs  03/01/16 0539  TSH 1.948    Telemetry    SR with PVCs - Personally Reviewed  ECG  SR with LVH - Personally Reviewed  Radiology    No results found.  Cardiac Studies   ETT results to follow  TTE; 02/29/2016  - Left ventricle: The cavity size was normal. There was mild   concentric hypertrophy. Systolic function was normal. The   estimated ejection fraction was in the range of 60% to 65%. Wall   motion was normal; there were no regional wall motion   abnormalities. Doppler parameters are consistent with abnormal   left ventricular relaxation (grade 1 diastolic dysfunction). - Aortic valve: Transvalvular velocity was within the normal range.   There was no stenosis. There was no regurgitation. - Mitral valve: Transvalvular velocity was within the normal range.   There was no evidence for stenosis. There was no regurgitation. - Right ventricle: The cavity size was normal. Wall thickness was   normal. Systolic function was normal. - Tricuspid valve: There was trivial regurgitation.  Patient Profile     40 y.o. year old male with a history of HTN, GERD, tob use.  Pt admitted 12/21 w/ HTN urgency and chest pain, trop elevated, cards asked to see.   Assessment &  Plan    .  Elevated troponin - pt with no ischemic sx and good exercise tolerance. - troponin is mildly elevated but flat, in the setting of HTN urgency - echo ordered and + LVH normal EF.   - MD to see.  Otherwise, per IM Active Problems:   Hypertensive urgency   Tobacco abuse   Acute hypokalemia  Signed, Nada BoozerLaura Ingold, NP  03/01/2016, 9:57 AM  [facility] Pager [phone]  After 5 or weekends [phone]  The patient was seen, examined and discussed with Nada BoozerLaura Ingold, NP and I agree with the above.   The patient underwent ETT today that was negative for ischemia, but he had hypertensive response to stress, good exercise capacity. Normal LVEF, no wall motion abnormalities. Troponin elevation mild and flat trend consistent with demand ischemia with hypertensive emergency. I will add carvedilol 12.5 mg po bid to his regimen.   Tobias AlexanderKatarina Javian Nudd 03/01/2016

## 2016-03-01 NOTE — Discharge Instructions (Signed)
Hypertension Hypertension is another name for high blood pressure. High blood pressure forces your heart to work harder to pump blood. A blood pressure reading has two numbers, which includes a higher number over a lower number (example: 110/72). Follow these instructions at home:  Have your blood pressure rechecked by your doctor.  Only take medicine as told by your doctor. Follow the directions carefully. The medicine does not work as well if you skip doses. Skipping doses also puts you at risk for problems.  Do not smoke.  Monitor your blood pressure at home as told by your doctor. Contact a doctor if:  You think you are having a reaction to the medicine you are taking.  You have repeat headaches or feel dizzy.  You have puffiness (swelling) in your ankles.  You have trouble with your vision. Get help right away if:  You get a very bad headache and are confused.  You feel weak, numb, or faint.  You get chest or belly (abdominal) pain.  You throw up (vomit).  You cannot breathe very well. This information is not intended to replace advice given to you by your health care provider. Make sure you discuss any questions you have with your health care provider. Document Released: 08/13/2007 Document Revised: 08/02/2015 Document Reviewed: 12/17/2012 Elsevier Interactive Patient Education  2017 Elsevier Inc.   Steps to Quit Smoking Smoking tobacco can be bad for your health. It can also affect almost every organ in your body. Smoking puts you and people around you at risk for many serious long-lasting (chronic) diseases. Quitting smoking is hard, but it is one of the best things that you can do for your health. It is never too late to quit. What are the benefits of quitting smoking? When you quit smoking, you lower your risk for getting serious diseases and conditions. They can include:  Lung cancer or lung disease.  Heart disease.  Stroke.  Heart attack.  Not being able  to have children (infertility).  Weak bones (osteoporosis) and broken bones (fractures). If you have coughing, wheezing, and shortness of breath, those symptoms may get better when you quit. You may also get sick less often. If you are pregnant, quitting smoking can help to lower your chances of having a baby of low birth weight. What can I do to help me quit smoking? Talk with your doctor about what can help you quit smoking. Some things you can do (strategies) include:  Quitting smoking totally, instead of slowly cutting back how much you smoke over a period of time.  Going to in-person counseling. You are more likely to quit if you go to many counseling sessions.  Using resources and support systems, such as:  Online chats with a counselor.  Phone quitlines.  Printed self-help materials.  Support groups or group counseling.  Text messaging programs.  Mobile phone apps or applications.  Taking medicines. Some of these medicines may have nicotine in them. If you are pregnant or breastfeeding, do not take any medicines to quit smoking unless your doctor says it is okay. Talk with your doctor about counseling or other things that can help you. Talk with your doctor about using more than one strategy at the same time, such as taking medicines while you are also going to in-person counseling. This can help make quitting easier. What things can I do to make it easier to quit? Quitting smoking might feel very hard at first, but there is a lot that you can do   to make it easier. Take these steps:  Talk to your family and friends. Ask them to support and encourage you.  Call phone quitlines, reach out to support groups, or work with a counselor.  Ask people who smoke to not smoke around you.  Avoid places that make you want (trigger) to smoke, such as:  Bars.  Parties.  Smoke-break areas at work.  Spend time with people who do not smoke.  Lower the stress in your life. Stress  can make you want to smoke. Try these things to help your stress:  Getting regular exercise.  Deep-breathing exercises.  Yoga.  Meditating.  Doing a body scan. To do this, close your eyes, focus on one area of your body at a time from head to toe, and notice which parts of your body are tense. Try to relax the muscles in those areas.  Download or buy apps on your mobile phone or tablet that can help you stick to your quit plan. There are many free apps, such as QuitGuide from the CDC (Centers for Disease Control and Prevention). You can find more support from smokefree.gov and other websites. This information is not intended to replace advice given to you by your health care provider. Make sure you discuss any questions you have with your health care provider. Document Released: 12/21/2008 Document Revised: 10/23/2015 Document Reviewed: 07/11/2014 Elsevier Interactive Patient Education  2017 Elsevier Inc.   

## 2016-03-03 NOTE — Discharge Summary (Signed)
Physician Discharge Summary  Samuel Cantrell ZOX:[id number] DOB: [birth year] DOA: 02/28/2016  PCP: Esperanza RichtersSaguier, Edward, PA-C  Admit date: 02/28/2016 Discharge date: 03/03/2016  Admitted From: Home.  Disposition:  HOme.   Recommendations for Outpatient Follow-up:  1. Follow up with PCP in 1-2 weeks 2. Please obtain BMP/CBC in one week    Discharge Condition:stable.  CODE STATUS:full code.  Diet recommendation: Heart Healthy   Brief/Interim Summary: Samuel L Harringtonis a 40 y.o.malewith medical history significant for tobacco abuse and no prior history of hypertension, presents with dizziness , was found to be in hypertensive emergency with elevated troponins. He was admitted to medical service and cardiology consulted. He underwent  ETT and is negative for ischemia.   Discharge Diagnoses:  Principal Problem:   Elevated troponin Active Problems:   Hypertensive urgency   Tobacco abuse   Acute hypokalemia  Accelerated Hypertension:  bp parameters much better today with meds.  Discharged on coreg, amlodipine and lisinopril.    Elevated troponins suspect from the above, ETT does not show any ischemia.     Tobacco abuse: on patch.    Hypokalemia: repleted.   Right arm pain: intermittent.  No swelling or signs of cellulitis.  ? Neuropathic pain.  Resolved on discharge. Recommend to follow up with PCP if it reoccurs.    Discharge Instructions  Discharge Instructions    Diet - low sodium heart healthy    Complete by:  As directed    Discharge instructions    Complete by:  As directed    Follow up with PCP in one week.     Allergies as of 03/01/2016   No Known Allergies     Medication List    STOP taking these medications   ibuprofen 200 MG tablet Commonly known as:  ADVIL,MOTRIN     TAKE these medications   amLODipine 10 MG tablet Commonly known as:  NORVASC Take 1 tablet (10 mg total) by mouth daily.   carvedilol 12.5 MG tablet Commonly  known as:  COREG Take 1 tablet (12.5 mg total) by mouth 2 (two) times daily with a meal.   diphenhydrAMINE 25 MG tablet Commonly known as:  BENADRYL Take 25 mg by mouth every 6 (six) hours as needed for itching or allergies.   lisinopril 20 MG tablet Commonly known as:  PRINIVIL,ZESTRIL Take 1 tablet (20 mg total) by mouth daily.      Follow-up Information    Cantrell, Samuel Mcdward, PA-C. Schedule an appointment as soon as possible for a visit in 1 week(s).   Specialties:  Internal Medicine, Family Medicine Contact information: [address] [place] [address] [phone]          No Known Allergies  Consultations: Cardiology.   Procedures/Studies: Dg Chest 2 View  Result Date: 02/28/2016 CLINICAL DATA:  Weakness and fever for 3 days.  Smoker. EXAM: CHEST  2 VIEW COMPARISON:  None FINDINGS: The heart size and mediastinal contours are within normal limits. Both lungs are clear. The visualized skeletal structures are unremarkable. IMPRESSION: No active cardiopulmonary disease. Electronically Signed   By: Signa Kellaylor  Cantrell M.D.   On: 02/28/2016 09:31       Subjective: No complaints.   Discharge Exam: Vitals:   03/01/16 0500 03/01/16 1056  BP: (!) 152/97 (!) 139/99  Pulse: 95 (!) 101  Resp: 16   Temp: 98.2 F (36.8 C)    Vitals:   02/29/16 1339 02/29/16 2020 03/01/16 0500 03/01/16 1056  BP: (!) 145/92 [name] Kitchen(!)  152/88 (!) 152/97 (!) 139/99  Pulse: 90 85 95 (!) 101  Resp: 13 14 16    Temp: 98.4 F (36.9 C) 98.2 F (36.8 C) 98.2 F (36.8 C)   TempSrc: Oral     SpO2: 100% 100% 100%   Weight:   56.2 kg (124 lb)   Height:        General: Pt is alert, awake, not in acute distress Cardiovascular: RRR, S1/S2 +, no rubs, no gallops Respiratory: CTA bilaterally, no wheezing, no rhonchi Abdominal: Soft, NT, ND, bowel sounds + Extremities: no edema, no cyanosis    The results of significant diagnostics from this hospitalization (including imaging,  microbiology, ancillary and laboratory) are listed below for reference.     Microbiology: No results found for this or any previous visit (from the past 240 hour(s)).   Labs: BNP (last 3 results) No results for input(s): BNP in the last 8760 hours. Basic Metabolic Panel:  Recent Labs Lab 02/28/16 0930 02/29/16 0457  NA 140 138  K 3.3* 3.9  CL 99* 99*  CO2 29 30  GLUCOSE 97 103*  BUN 10 7  CREATININE 0.66 0.79  CALCIUM 9.2 9.4   Liver Function Tests: No results for input(s): AST, ALT, ALKPHOS, BILITOT, PROT, ALBUMIN in the last 168 hours. No results for input(s): LIPASE, AMYLASE in the last 168 hours. No results for input(s): AMMONIA in the last 168 hours. CBC:  Recent Labs Lab 02/28/16 0930  WBC 5.1  NEUTROABS 1.7  HGB 15.6  HCT 43.0  MCV 81.3  PLT 175   Cardiac Enzymes:  Recent Labs Lab 02/28/16 0930 02/28/16 1409 02/28/16 2040  TROPONINI 0.22* 0.23* 0.26*   BNP: Invalid input(s): POCBNP CBG: No results for input(s): GLUCAP in the last 168 hours. D-Dimer No results for input(s): DDIMER in the last 72 hours. Hgb A1c No results for input(s): HGBA1C in the last 72 hours. Lipid Profile No results for input(s): CHOL, HDL, LDLCALC, TRIG, CHOLHDL, LDLDIRECT in the last 72 hours. Thyroid function studies  Recent Labs  03/01/16 0539  TSH 1.948   Anemia work up No results for input(s): VITAMINB12, FOLATE, FERRITIN, TIBC, IRON, RETICCTPCT in the last 72 hours. Urinalysis    Component Value Date/Time   BILIRUBINUR neg 05/02/2015 0846   PROTEINUR neg 05/02/2015 0846   UROBILINOGEN 0.2 05/02/2015 0846   NITRITE neg 05/02/2015 0846   LEUKOCYTESUR Negative 05/02/2015 0846   Sepsis Labs Invalid input(s): PROCALCITONIN,  WBC,  LACTICIDVEN Microbiology No results found for this or any previous visit (from the past 240 hour(s)).   Time coordinating discharge: Over 30 minutes  SIGNED:   Kathlen ModyAKULA,Samuel Slutsky, MD  [facility] 03/03/2016, 9:22  PM Pager   If 7PM-7AM, please contact night-coverage [email or web address] Password [email or web address]

## 2016-03-11 ENCOUNTER — Ambulatory Visit (INDEPENDENT_AMBULATORY_CARE_PROVIDER_SITE_OTHER): Payer: BLUE CROSS/BLUE SHIELD | Admitting: Medical

## 2016-03-11 ENCOUNTER — Encounter: Payer: Self-pay | Admitting: Medical

## 2016-03-11 ENCOUNTER — Telehealth: Payer: Self-pay | Admitting: Medical

## 2016-03-11 VITALS — BP 128/80 | HR 110 | Temp 98.5°F | Resp 16 | Ht 68.0 in | Wt 136.2 lb

## 2016-03-11 DIAGNOSIS — I493 Ventricular premature depolarization: Secondary | ICD-10-CM

## 2016-03-11 DIAGNOSIS — R000 Tachycardia, unspecified: Secondary | ICD-10-CM | POA: Diagnosis not present

## 2016-03-11 DIAGNOSIS — I10 Essential (primary) hypertension: Secondary | ICD-10-CM | POA: Diagnosis not present

## 2016-03-11 LAB — CBC WITH DIFFERENTIAL/PLATELET
BASOS PCT: 0.8 % (ref 0.0–3.0)
Basophils Absolute: 0.1 10*3/uL (ref 0.0–0.1)
EOS ABS: 0.4 10*3/uL (ref 0.0–0.7)
EOS PCT: 5.7 % — AB (ref 0.0–5.0)
HEMATOCRIT: 42.5 % (ref 39.0–52.0)
HEMOGLOBIN: 14.6 g/dL (ref 13.0–17.0)
LYMPHS PCT: 45.9 % (ref 12.0–46.0)
Lymphs Abs: 3.3 10*3/uL (ref 0.7–4.0)
MCHC: 34.5 g/dL (ref 30.0–36.0)
MCV: 85.9 fl (ref 78.0–100.0)
MONOS PCT: 7.6 % (ref 3.0–12.0)
Monocytes Absolute: 0.5 10*3/uL (ref 0.1–1.0)
Neutro Abs: 2.9 10*3/uL (ref 1.4–7.7)
Neutrophils Relative %: 40 % — ABNORMAL LOW (ref 43.0–77.0)
Platelets: 227 10*3/uL (ref 150.0–400.0)
RBC: 4.94 Mil/uL (ref 4.22–5.81)
RDW: 14.7 % (ref 11.5–15.5)
WBC: 7.2 10*3/uL (ref 4.0–10.5)

## 2016-03-11 LAB — COMPREHENSIVE METABOLIC PANEL
ALBUMIN: 4.1 g/dL (ref 3.5–5.2)
ALT: 39 U/L (ref 0–53)
AST: 56 U/L — AB (ref 0–37)
Alkaline Phosphatase: 87 U/L (ref 39–117)
BILIRUBIN TOTAL: 0.4 mg/dL (ref 0.2–1.2)
BUN: 12 mg/dL (ref 6–23)
CALCIUM: 9.1 mg/dL (ref 8.4–10.5)
CHLORIDE: 100 meq/L (ref 96–112)
CO2: 30 mEq/L (ref 19–32)
CREATININE: 0.74 mg/dL (ref 0.40–1.50)
GFR: 148.55 mL/min (ref >60.00)
Glucose, Bld: 88 mg/dL (ref 70–99)
Potassium: 3.3 mEq/L — ABNORMAL LOW (ref 3.5–5.1)
Sodium: 138 mEq/L (ref 135–145)
Total Protein: 7.4 g/dL (ref 6.0–8.3)

## 2016-03-11 MED ORDER — LISINOPRIL 10 MG PO TABS: 10.0000 mg | ORAL_TABLET | Freq: Every day | ORAL | 0 refills | Status: DC

## 2016-03-11 MED ORDER — POTASSIUM CHLORIDE ER 10 MEQ PO TBCR: 10.0000 meq | EXTENDED_RELEASE_TABLET | Freq: Every day | ORAL | 0 refills | Status: DC

## 2016-03-11 NOTE — Telephone Encounter (Signed)
rx k sent in to pharmacy.

## 2016-03-11 NOTE — Progress Notes (Signed)
Subjective:    Patient ID: Samuel Cantrell, male    DOB: 13-Feb-1976, 41 y.o.   MRN: [id number]  HPI  Pt state got dizzy on Monday 2 weeks ago. On Sunday before Monday he took ibuprofen and Alkaseltzer cold plus. Next day is when dizziness start and he felt funny. He had no chest pain. No arm pain. Other than dizziness at time ED evaluated denied any associated cardiac signs or symptoms. But his bp was moderate-high. He was admitted to ED after lab work revealed he had elevated troponin. No cardiac or neurologic signs or symptoms at time in ED or since  Cardioloigist saw pt in hospital after admitted. Cardiologist states no ischemic symptoms and good exercise tolerance. Stated troponin mild elevated in setting of htn urgency;   Pt in ED had voltage criteria for lvh. Pt blood work showed mild low k in ED.  Pt was diagnosed in ED with hypertensive urgency.  Pt was discharged on lisinopril 20 mg q day. Amlodipine 10 mg a day. And carvedilol.   On review pt stress test results. There was no ST segment deviation noted during stress.  No T wave inversion was noted during stress. Arrhythmias during stress: none.  Arrhythmias during recovery: frequent PVCs.  Arrhythmias were not significant.  ECG was interpretable and conclusive.  Pt stopped coreg recently. He states he was given 3 meds on discharge but 10 days ago his bp was 90/60. Pt called his sister who is a Engineer, civil (consulting) and she suggested stopping the coreg. Since stopping coreg his bp has been 120/80. Couple of times states his bp was 120/45. But that is rare.    Review of Systems  Constitutional: Negative for chills, fatigue and fever.  Respiratory: Negative for cough, chest tightness, shortness of breath and wheezing.   Cardiovascular: Negative for chest pain and palpitations.  Gastrointestinal: Negative for abdominal pain.  Musculoskeletal: Negative for back pain and myalgias.  Skin: Negative for rash.  Neurological: Negative for  dizziness, speech difficulty, weakness, light-headedness, numbness and headaches.  Hematological: Negative for adenopathy. Does not bruise/bleed easily.  Psychiatric/Behavioral: Negative for behavioral problems, confusion, decreased concentration, self-injury and suicidal ideas. The patient is not nervous/anxious.     Past Medical History:  Diagnosis Date  . Allergy   . GERD (gastroesophageal reflux disease)   . Hypertensive urgency 02/28/2016     Social History   Social History  . Marital status: Married    Spouse name: N/A  . Number of children: N/A  . Years of education: N/A   Occupational History  . Utility Locator    Social History Main Topics  . Smoking status: Current Every Day Smoker    Packs/day: 0.50    Years: 8.00    Types: Cigarettes  . Smokeless tobacco: Never Used  . Alcohol use Yes     Comment: every other night 2 beers  . Drug use: No  . Sexual activity: Yes   Other Topics Concern  . Not on file   Social History Narrative   Pt lives with wife.    Past Surgical History:  Procedure Laterality Date  . NO PAST SURGERIES      Family History  Problem Relation Age of Onset  . Hypertension Mother     No Known Allergies  Current Outpatient Prescriptions on File Prior to Visit  Medication Sig Dispense Refill  . amLODipine (NORVASC) 10 MG tablet Take 1 tablet (10 mg total) by mouth daily. 30 tablet 0  . diphenhydrAMINE (  BENADRYL) 25 MG tablet Take 25 mg by mouth every 6 (six) hours as needed for itching or allergies.    . carvedilol (COREG) 12.5 MG tablet Take 1 tablet (12.5 mg total) by mouth 2 (two) times daily with a meal. (Patient not taking: Reported on 03/11/2016) 60 tablet 0   No current facility-administered medications on file prior to visit.     BP 128/80   Pulse (!) 110   Temp 98.5 F (36.9 C) (Oral)   Resp 16   Ht 5\' 8"  (1.727 m)   Wt 136 lb 4 oz (61.8 kg)   SpO2 100%   BMI 20.72 kg/m       Objective:   Physical  Exam   General Mental Status- Alert. General Appearance- Not in acute distress.   Skin General: Color- Normal Color. Moisture- Normal Moisture.  Neck Carotid Arteries- Normal color. Moisture- Normal Moisture. No carotid bruits. No JVD.  Chest and Lung Exam Auscultation: Breath Sounds:-Normal.  Cardiovascular Auscultation:Rythm- Regular. Murmurs & Other Heart Sounds:Auscultation of the heart reveals- No Murmurs.  Abdomen Inspection:-Inspeection Normal. Palpation/Percussion:Note:No mass. Palpation and Percussion of the abdomen reveal- Non Tender, Non Distended + BS, no rebound or guarding.  Neurologic Cranial Nerve exam:- CN III-XII intact(No nystagmus), symmetric smile. Strength:- 5/5 equal and symmetric strength both upper and lower extremities.     Assessment & Plan:  For your blood pressure I want you to continue the coreg dose that hospital discharge you on and reduce the lisinopril to 10 mg a day. Continue amlodipine at 10 mg a day.   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  Check your bp at least one time and day and document. If you feel bad(light head, dizzy, weak etc check bp as well). If you do get bp drop less then than 110/70 let us know. And if you get pulse less than 55 let us know as well.  Please get  Cbc and cmp today.  Follow up in 10-14 days or as needed  Corgan Mormile, Ramon DredgeEdward, VF CorporationPA-C

## 2016-03-11 NOTE — Progress Notes (Signed)
Pre visit review using our clinic review tool, if applicable. No additional management support is needed unless otherwise documented below in the visit note/SLS  

## 2016-03-11 NOTE — Patient Instructions (Addendum)
For your blood pressure I want you to continue the coreg dose that hospital discharge you on and reduce the lisinopril to 10 mg a day. Continue amlodipine at 10 mg a day.   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  Check your bp at least one time and day and document. If you feel bad(light head, dizzy, weak etc check bp as well). If you do get bp drop less then than 110/70 let us know(also update us if bp over 150/90). And if you get pulse less than 55 let us know as well.  Please get  Cbc and cmp today.  Follow up in 10-14 days or as needed  If cardiac or neurologic signs or symptom the ED evaluation as well.

## 2016-04-03 ENCOUNTER — Other Ambulatory Visit: Payer: Self-pay | Admitting: Medical

## 2016-04-03 ENCOUNTER — Telehealth: Payer: Self-pay | Admitting: Medical

## 2016-04-03 ENCOUNTER — Encounter: Payer: Self-pay | Admitting: Medical

## 2016-04-03 NOTE — Telephone Encounter (Signed)
Relation to ZO:XWRUpt:self Call back number:[phone] [facility]  [id number] [place] - [phone] [facility] [address] [phone] (Phone) [phone] (Fax)    Reason for call:  Patient requesting a refill amLODipine (NORVASC) 10 MG tablet, patient would like to know when PA would like him to follow up, please advise

## 2016-04-03 NOTE — Telephone Encounter (Signed)
error:[id number] ° °

## 2016-04-04 ENCOUNTER — Telehealth: Payer: Self-pay | Admitting: Medical

## 2016-04-04 MED ORDER — CARVEDILOL 12.5 MG PO TABS: 12.5000 mg | ORAL_TABLET | Freq: Two times a day (BID) | ORAL | 2 refills | Status: DC

## 2016-04-04 MED ORDER — AMLODIPINE BESYLATE 10 MG PO TABS: 10.0000 mg | ORAL_TABLET | Freq: Every day | ORAL | 2 refills | Status: DC

## 2016-04-04 MED ORDER — POTASSIUM CHLORIDE ER 10 MEQ PO TBCR: 10.0000 meq | EXTENDED_RELEASE_TABLET | Freq: Every day | ORAL | 0 refills | Status: DC

## 2016-04-04 MED ORDER — LISINOPRIL 10 MG PO TABS: 10.0000 mg | ORAL_TABLET | Freq: Every day | ORAL | 0 refills | Status: DC

## 2016-04-04 NOTE — Telephone Encounter (Signed)
Rx request to pharmacy/SLS  

## 2016-04-04 NOTE — Telephone Encounter (Addendum)
Patient reports that his BP and Pulse have been in normal range, he is out of the Coreg [given at hospital] that you wanted him to continue at 03/11/16 OV [has Hx of pvc and tachycardia]. Need to know if he is to continue medication and when do you want him to F/U.?/SLS 01/26  Notes Recorded by Lurline HareKaren E Carter, LPN on 1/6/10961/05/2016 at 11:14 AM EST Called patient with lab results. Verified that his BP meds had been sent to pharmacy as well as Kdur.  Notes Recorded by Esperanza RichtersEdward Saguier, PA-C on 03/11/2016 at 9:45 PM EST Pt Labs look good except his potassium is a little low. Will rx low dose k-dur to take one tab a day. Then advise eat more potassium foods such as banana at least every other day.  Assessment & Plan:   03/11/16 AVS  For your blood pressure I want you to continue the coreg dose that hospital discharge you on and reduce the lisinopril to 10 mg a day. Continue amlodipine at 10 mg a day.  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 Check your bp at least one time and day and document. If you feel bad(light head, dizzy, weak etc check bp as well). If you do get bp drop less then than 110/70 let us know. And if you get pulse less than 55 let us know as well. Please get  Cbc and cmp today. Follow up in 10-14 days or as needed Saguier, Ramon DredgeEdward, VF CorporationPA-C

## 2016-04-04 NOTE — Telephone Encounter (Signed)
Called pt and he has been following my advise on avs last visit. Bp have been controlled. Not too high or too low. Pulse usually in 80's. Refilled meds and asked him to follow up in 2-4 weeks. He agreed he would.

## 2016-04-04 NOTE — Telephone Encounter (Signed)
Patient aware that medication has been sent to the pharmacy. He would also like a call because he is a little confused on what he is supposed to be doing  With his bp. He is not sure if he needs to schedule a follow up appointment or what in order to get his blood pressure straight. Please advise  Phone: [phone]

## 2016-04-20 ENCOUNTER — Encounter (HOSPITAL_BASED_OUTPATIENT_CLINIC_OR_DEPARTMENT_OTHER): Payer: Self-pay | Admitting: Emergency Medicine

## 2016-04-20 ENCOUNTER — Emergency Department (HOSPITAL_BASED_OUTPATIENT_CLINIC_OR_DEPARTMENT_OTHER)
Admission: EM | Admit: 2016-04-20 | Discharge: 2016-04-21 | Disposition: A | Discharge status: Home / Self Care | Payer: BLUE CROSS/BLUE SHIELD | Attending: Emergency Medicine | Admitting: Emergency Medicine

## 2016-04-20 DIAGNOSIS — F17210 Nicotine dependence, cigarettes, uncomplicated: Secondary | ICD-10-CM | POA: Diagnosis not present

## 2016-04-20 DIAGNOSIS — R55 Syncope and collapse: Secondary | ICD-10-CM | POA: Insufficient documentation

## 2016-04-20 DIAGNOSIS — Z79899 Other long term (current) drug therapy: Secondary | ICD-10-CM | POA: Insufficient documentation

## 2016-04-20 LAB — URINALYSIS, MICROSCOPIC (REFLEX)

## 2016-04-20 LAB — CBC WITH DIFFERENTIAL/PLATELET
Basophils Absolute: 0 10*3/uL (ref 0.0–0.1)
Basophils Relative: 0 %
Eosinophils Absolute: 0.1 10*3/uL (ref 0.0–0.7)
Eosinophils Relative: 1 %
HCT: 41.6 % (ref 39.0–52.0)
Hemoglobin: 14.9 g/dL (ref 13.0–17.0)
Lymphocytes Relative: 33 %
Lymphs Abs: 2.5 10*3/uL (ref 0.7–4.0)
MCH: 29.9 pg (ref 26.0–34.0)
MCHC: 35.8 g/dL (ref 30.0–36.0)
MCV: 83.4 fL (ref 78.0–100.0)
Monocytes Absolute: 0.8 10*3/uL (ref 0.1–1.0)
Monocytes Relative: 10 %
Neutro Abs: 4 10*3/uL (ref 1.7–7.7)
Neutrophils Relative %: 56 %
Platelets: 211 10*3/uL (ref 150–400)
RBC: 4.99 MIL/uL (ref 4.22–5.81)
RDW: 16.2 % — ABNORMAL HIGH (ref 11.5–15.5)
WBC: 7.4 10*3/uL (ref 4.0–10.5)

## 2016-04-20 LAB — URINALYSIS, ROUTINE W REFLEX MICROSCOPIC
Glucose, UA: NEGATIVE mg/dL
Hgb urine dipstick: NEGATIVE
Ketones, ur: NEGATIVE mg/dL
Leukocytes, UA: NEGATIVE
Nitrite: NEGATIVE
Protein, ur: >300 mg/dL — AB
Specific Gravity, Urine: 1.025 (ref 1.005–1.030)
pH: 6.5 (ref 5.0–8.0)

## 2016-04-20 LAB — BASIC METABOLIC PANEL WITH GFR
Anion gap: 11 (ref 5–15)
BUN: 10 mg/dL (ref 6–20)
CO2: 28 mmol/L (ref 22–32)
Calcium: 8.8 mg/dL — ABNORMAL LOW (ref 8.9–10.3)
Chloride: 101 mmol/L (ref 101–111)
Creatinine, Ser: 0.81 mg/dL (ref 0.61–1.24)
GFR calc Af Amer: >60 mL/min
GFR calc non Af Amer: >60 mL/min
Glucose, Bld: 110 mg/dL — ABNORMAL HIGH (ref 65–99)
Potassium: 3.1 mmol/L — ABNORMAL LOW (ref 3.5–5.1)
Sodium: 140 mmol/L (ref 135–145)

## 2016-04-20 LAB — CBG MONITORING, ED: GLUCOSE-CAPILLARY: 115 mg/dL — AB (ref 65–99)

## 2016-04-20 MED ORDER — POTASSIUM CHLORIDE CRYS ER 20 MEQ PO TBCR
40.0000 meq | EXTENDED_RELEASE_TABLET | Freq: Once | ORAL | Status: AC
  Administered 2016-04-20: 40 meq via ORAL
  Filled 2016-04-20: qty 2

## 2016-04-20 MED ORDER — SODIUM CHLORIDE 0.9 % IV BOLUS (SEPSIS)
1000.0000 mL | Freq: Once | INTRAVENOUS | Status: AC
  Administered 2016-04-20: 1000 mL via INTRAVENOUS

## 2016-04-20 NOTE — ED Triage Notes (Signed)
Patient was at home and felt dizzy and passed out about an hour and half ago x 4 - the patient was seen by EMS - then refused transport. Patient denies any dizziness at this time. Reports that they are trying to regulate his BP - Wife states that he was very diaphoretic and was incontinent of stool when he passed out

## 2016-04-20 NOTE — ED Notes (Signed)
Wife reports he passed out 4 times today. Wife states he was completely out for a few seconds each time and had a BM one time  Pt has been taking 3 different BP meds since December and had no issues. PT alert and oriented and states he feels fine now and wants to go home.

## 2016-04-20 NOTE — ED Notes (Signed)
Alert, NAD, calm, interactive, resps e/u, speaking in clear complete sentences, no dyspnea noted, skin W&D, (denies: pain, sob, nausea dizziness or visual changes), EDP into room. [facility]

## 2016-04-20 NOTE — ED Provider Notes (Signed)
[facility] Provider Note: Lowella DellJ. Lane Hayslee Casebolt, MD, FACEP  CSN: [id number] MRN: [id number] ARRIVAL: 04/20/16 at 2130 ROOM: [facility]   By signing my name below, I, Levon HedgerElizabeth Hall, attest that this documentation has been prepared under the direction and in the presence of Paula LibraJohn Ayvah Caroll, MD . Electronically Signed: Levon HedgerElizabeth Hall, Scribe. 04/20/2016. 11:38 PM.   CHIEF COMPLAINT  Syncope  HISTORY OF PRESENT ILLNESS  Caffie DammeSharif L Roosevelt is a 41 y.o. male with a history of hypertensive urgency who presents to the Emergency Department for evaluation s/p four sudden onset syncopal episodes tonight. Pt told his wife he needed to move his bowels and was walking to the bathroom when he lost consciousness, grabbed the wall, and then struck the floor. Per wife, pt was unresponsive for a few seconds before telling her he felt fine and not to call the ambulance. He then had three subsequent syncopal episodes, each lasting a few seconds; per wife, pt was incontinent of stool when he fainted the second time. This is the first episode of syncope since starting on three blood pressure medications in 12/17. Pt feels as if he has returned to his baseline and is requesting to go home. He denies Injury or any recent illness, increased urination, nausea, vomiting, diarrhea, CP, SOB, or palpitations. Pt has no other complaints or symptoms at this time.   Past Medical History:  Diagnosis Date  . Allergy   . GERD (gastroesophageal reflux disease)   . Hypertensive urgency 02/28/2016    Past Surgical History:  Procedure Laterality Date  . NO PAST SURGERIES      Family History  Problem Relation Age of Onset  . Hypertension Mother     Social History  Substance Use Topics  . Smoking status: Current Every Day Smoker    Packs/day: 0.50    Years: 8.00    Types: Cigarettes  . Smokeless tobacco: Never Used  . Alcohol use Yes     Comment: every other night 2 beers    Prior to Admission medications     Medication Sig Start Date End Date Taking? Authorizing Provider  amLODipine (NORVASC) 10 MG tablet Take 1 tablet (10 mg total) by mouth daily. 04/04/16   Ramon DredgeEdward Saguier, PA-C  carvedilol (COREG) 12.5 MG tablet Take 1 tablet (12.5 mg total) by mouth 2 (two) times daily with a meal. 04/04/16   Esperanza RichtersEdward Saguier, PA-C  diphenhydrAMINE (BENADRYL) 25 MG tablet Take 25 mg by mouth every 6 (six) hours as needed for itching or allergies.    Historical Provider, MD  lisinopril (PRINIVIL,ZESTRIL) 10 MG tablet Take 1 tablet (10 mg total) by mouth daily. 04/04/16   Ramon DredgeEdward Saguier, PA-C  potassium chloride (K-DUR) 10 MEQ tablet Take 1 tablet (10 mEq total) by mouth daily. 04/04/16   Esperanza RichtersEdward Saguier, PA-C    Allergies Patient has no known allergies.   REVIEW OF SYSTEMS  Negative except as noted here or in the History of Present Illness.  PHYSICAL EXAMINATION  Initial Vital Signs Blood pressure 116/84, pulse 85, temperature 98.9 F (37.2 C), temperature source Oral, resp. rate 17, height 5\' 8"  (1.727 m), weight 135 lb (61.2 kg), SpO2 100 %.  Examination General: Well-developed, well-nourished male in no acute distress; appearance consistent with age of record HENT: normocephalic; atraumatic Eyes: pupils equal, round and reactive to light; extraocular muscles intact Neck: supple Heart: regular rate and rhythm Lungs: clear to auscultation bilaterally Abdomen: soft; nondistended; nontender; no masses or hepatosplenomegaly; bowel sounds present Extremities: No deformity; full  range of motion; pulses normal Neurologic: Awake, alert and oriented; motor function intact in all extremities and symmetric; no facial droop; normal coordination, speech and gait Skin: Warm and dry Psychiatric: Normal mood and affect  RESULTS  Summary of this visit's results, reviewed by myself:   EKG Interpretation  Date/Time:  Sunday April 20 2016 21:47:27 EST Ventricular Rate:  79 PR Interval:  190 QRS Duration: 80 QT  Interval:  400 QTC Calculation: 458 R Axis:   87 Text Interpretation:  Normal sinus rhythm Normal ECG No significant change was found Confirmed by Jeri Rawlins  MD, Jonny Ruiz ([id number]) on 04/20/2016 10:46:34 PM      Laboratory Studies: Results for orders placed or performed during the hospital encounter of 04/20/16 (from the past 24 hour(s))  Urinalysis, Routine w reflex microscopic     Status: Abnormal   Collection Time: 04/20/16 10:45 PM  Result Value Ref Range   Color, Urine AMBER (A) YELLOW   APPearance CLOUDY (A) CLEAR   Specific Gravity, Urine 1.025 1.005 - 1.030   pH 6.5 5.0 - 8.0   Glucose, UA NEGATIVE NEGATIVE mg/dL   Hgb urine dipstick NEGATIVE NEGATIVE   Bilirubin Urine SMALL (A) NEGATIVE   Ketones, ur NEGATIVE NEGATIVE mg/dL   Protein, ur >604 (A) NEGATIVE mg/dL   Nitrite NEGATIVE NEGATIVE   Leukocytes, UA NEGATIVE NEGATIVE  Urinalysis, Microscopic (reflex)     Status: Abnormal   Collection Time: 04/20/16 10:45 PM  Result Value Ref Range   RBC / HPF 0-5 0 - 5 RBC/hpf   WBC, UA 6-30 0 - 5 WBC/hpf   Bacteria, UA MANY (A) NONE SEEN   Squamous Epithelial / LPF 0-5 (A) NONE SEEN   Mucous PRESENT    Hyaline Casts, UA PRESENT    Cellular Cast, UA PRESENT    Urine-Other SPERM PRESENT   CBG monitoring, ED     Status: Abnormal   Collection Time: 04/20/16 10:54 PM  Result Value Ref Range   Glucose-Capillary 115 (H) 65 - 99 mg/dL  Basic metabolic panel     Status: Abnormal   Collection Time: 04/20/16 11:04 PM  Result Value Ref Range   Sodium 140 135 - 145 mmol/L   Potassium 3.1 (L) 3.5 - 5.1 mmol/L   Chloride 101 101 - 111 mmol/L   CO2 28 22 - 32 mmol/L   Glucose, Bld 110 (H) 65 - 99 mg/dL   BUN 10 6 - 20 mg/dL   Creatinine, Ser 5.40 0.61 - 1.24 mg/dL   Calcium 8.8 (L) 8.9 - 10.3 mg/dL   GFR calc non Af Amer >60 >60 mL/min   GFR calc Af Amer >60 >60 mL/min   Anion gap 11 5 - 15  CBC with Differential/Platelet     Status: Abnormal   Collection Time: 04/20/16 11:05 PM  Result  Value Ref Range   WBC 7.4 4.0 - 10.5 K/uL   RBC 4.99 4.22 - 5.81 MIL/uL   Hemoglobin 14.9 13.0 - 17.0 g/dL   HCT 98.1 19.1 - 47.8 %   MCV 83.4 78.0 - 100.0 fL   MCH 29.9 26.0 - 34.0 pg   MCHC 35.8 30.0 - 36.0 g/dL   RDW 29.5 (H) 62.1 - 30.8 %   Platelets 211 150 - 400 K/uL   Neutrophils Relative % 56 %   Neutro Abs 4.0 1.7 - 7.7 K/uL   Lymphocytes Relative 33 %   Lymphs Abs 2.5 0.7 - 4.0 K/uL   Monocytes Relative 10 %   Monocytes  Absolute 0.8 0.1 - 1.0 K/uL   Eosinophils Relative 1 %   Eosinophils Absolute 0.1 0.0 - 0.7 K/uL   Basophils Relative 0 %   Basophils Absolute 0.0 0.0 - 0.1 K/uL   Imaging Studies: No results found.  ED COURSE  Nursing notes and initial vitals signs, including pulse oximetry, reviewed.  Vitals:   04/20/16 2143 04/20/16 2300 04/20/16 2330 04/20/16 2345  BP: 92/72 116/84 121/87 119/81  Pulse: 84 85 83 85  Resp: 18 17 11 14   Temp:      TempSrc:      SpO2: 100% 100% 100% 100%  Weight:      Height:       12:48 AM Patient's initial hypotension has resolved. He was given one liter of normal saline and has been ambulating without difficulty. He continues to be asymptomatic. Suspect patient's syncope was due to vasovagal affects of needing to move his bowels along with new antihypertensives. He has an appointment with his PCP scheduled for tomorrow.  PROCEDURES    ED DIAGNOSES     ICD-9-CM ICD-10-CM   1. Vasovagal syncope 780.2 R55      I personally performed the services described in this documentation, which was scribed in my presence. The recorded information has been reviewed and is accurate.    Paula Libra, MD 04/21/16 [phone]

## 2016-04-21 NOTE — ED Notes (Signed)
Patient did well ambulating in the hall. Pt denied dizziness.

## 2016-04-22 ENCOUNTER — Encounter: Payer: Self-pay | Admitting: Medical

## 2016-04-22 ENCOUNTER — Ambulatory Visit (INDEPENDENT_AMBULATORY_CARE_PROVIDER_SITE_OTHER): Payer: BLUE CROSS/BLUE SHIELD | Admitting: Medical

## 2016-04-22 VITALS — BP 152/93 | HR 88 | Temp 98.6°F | Resp 16 | Ht 68.0 in | Wt 131.1 lb

## 2016-04-22 DIAGNOSIS — I10 Essential (primary) hypertension: Secondary | ICD-10-CM | POA: Diagnosis not present

## 2016-04-22 DIAGNOSIS — R55 Syncope and collapse: Secondary | ICD-10-CM | POA: Diagnosis not present

## 2016-04-22 LAB — COMPREHENSIVE METABOLIC PANEL
ALT: 55 U/L — AB (ref 0–53)
AST: 125 U/L — ABNORMAL HIGH (ref 0–37)
Albumin: 3.9 g/dL (ref 3.5–5.2)
Alkaline Phosphatase: 97 U/L (ref 39–117)
BUN: 7 mg/dL (ref 6–23)
CO2: 30 meq/L (ref 19–32)
Calcium: 8.9 mg/dL (ref 8.4–10.5)
Chloride: 104 mEq/L (ref 96–112)
Creatinine, Ser: 0.65 mg/dL (ref 0.40–1.50)
GFR: 172.44 mL/min (ref >60.00)
GLUCOSE: 102 mg/dL — AB (ref 70–99)
POTASSIUM: 3.3 meq/L — AB (ref 3.5–5.1)
SODIUM: 138 meq/L (ref 135–145)
Total Bilirubin: 1.1 mg/dL (ref 0.2–1.2)
Total Protein: 7 g/dL (ref 6.0–8.3)

## 2016-04-22 NOTE — Patient Instructions (Signed)
Concern for low bp and recent related hypotension associated. But bp on higher side today. You may have some white coat hypertension today. Check bp 2-3 times a day between now and Friday. Then my chart your readings. May make decrease on amlodipine dose if again confirmed low bp but can't do presently with high bp reading.  Check cmp today.  If any bp less than 110/70 then notify us. My cell phone is [phone]. You could try to call me if you do get random low reading. I will try to answer your call. But also note we have doctors on call as well.  Follow up in 3 weeks or as needed

## 2016-04-22 NOTE — Progress Notes (Signed)
Subjective:    Patient ID: Samuel Cantrell, male    DOB: [birth year], 41 y.o.   MRN: [id number]  HPI  Pt in for follow up. His blood pressure was low on day he went to the ED. Pt states on Sunday morning he checked his blood pressure and was 72/57. He went ahead and took the norvasc. The later had syncope episode. Pt bp readings other than day of ED evaluation are mostly under controlled. About 1/2-2/3 of pt reading are in tightly controlled side systolic mid 110-120 range and diastolic 70-80 range.   I had advised pt to watch his bp and let us know if bp was less that 110/70 or if pulse less than 55.  Pt was was potassium was mild low on last check in ED.   I got 150/90 today. His bp machine got 150/105 and 143/101    Review of Systems  Constitutional: Negative for chills and fatigue.  HENT: Negative for congestion, ear pain, nosebleeds, postnasal drip, sinus pain, sinus pressure and sore throat.   Respiratory: Negative for apnea, cough, choking, shortness of breath and wheezing.   Cardiovascular: Negative for chest pain and palpitations.  Gastrointestinal: Negative for abdominal pain and blood in stool.  Genitourinary: Negative for dysuria, flank pain, frequency and urgency.  Musculoskeletal: Negative for back pain.  Skin: Negative for rash.  Neurological: Negative for dizziness, speech difficulty, weakness and headaches.  Hematological: Negative for adenopathy. Does not bruise/bleed easily.  Psychiatric/Behavioral: Negative for behavioral problems, confusion, dysphoric mood, self-injury and suicidal ideas. The patient is not nervous/anxious.     Past Medical History:  Diagnosis Date  . Allergy   . GERD (gastroesophageal reflux disease)   . Hypertensive urgency 02/28/2016     Social History   Social History  . Marital status: Married    Spouse name: N/A  . Number of children: N/A  . Years of education: N/A   Occupational History  . Utility Locator    Social  History Main Topics  . Smoking status: Current Every Day Smoker    Packs/day: 0.50    Years: 8.00    Types: Cigarettes  . Smokeless tobacco: Never Used  . Alcohol use Yes     Comment: every other night 2 beers  . Drug use: No  . Sexual activity: Yes   Other Topics Concern  . Not on file   Social History Narrative   Pt lives with wife.    Past Surgical History:  Procedure Laterality Date  . NO PAST SURGERIES      Family History  Problem Relation Age of Onset  . Hypertension Mother     No Known Allergies  Current Outpatient Prescriptions on File Prior to Visit  Medication Sig Dispense Refill  . amLODipine (NORVASC) 10 MG tablet Take 1 tablet (10 mg total) by mouth daily. 30 tablet 2  . carvedilol (COREG) 12.5 MG tablet Take 1 tablet (12.5 mg total) by mouth 2 (two) times daily with a meal. 60 tablet 2  . diphenhydrAMINE (BENADRYL) 25 MG tablet Take 25 mg by mouth every 6 (six) hours as needed for itching or allergies.    [name] Kitchen lisinopril (PRINIVIL,ZESTRIL) 10 MG tablet Take 1 tablet (10 mg total) by mouth daily. 30 tablet 0  . potassium chloride (K-DUR) 10 MEQ tablet Take 1 tablet (10 mEq total) by mouth daily. (Patient not taking: Reported on 04/22/2016) 7 tablet 0   No current facility-administered medications on file prior to visit.  BP (!) 152/93 (BP Location: Right Arm, Patient Position: Sitting, Cuff Size: Normal)   Pulse 88   Temp 98.6 F (37 C) (Oral)   Resp 16   Ht 5\' 8"  (1.727 m)   Wt 131 lb 2 oz (59.5 kg)   SpO2 100%   BMI 19.94 kg/m       Objective:   Physical Exam  General Mental Status- Alert. General Appearance- Not in acute distress.   Skin General: Color- Normal Color. Moisture- Normal Moisture.  Neck Carotid Arteries- Normal color. Moisture- Normal Moisture. No carotid bruits. No JVD.  Chest and Lung Exam Auscultation: Breath Sounds:-Normal.  Cardiovascular Auscultation:Rythm- Regular. Murmurs & Other Heart Sounds:Auscultation of  the heart reveals- No Murmurs.  Abdomen Inspection:-Inspeection Normal. Palpation/Percussion:Note:No mass. Palpation and Percussion of the abdomen reveal- Non Tender, Non Distended + BS, no rebound or guarding.    Neurologic CN III- XII intact. Strength:- 5/5 equal and symmetric strength both upper and lower extremities.     Assessment & Plan:  Concern for low bp and recent related hypotension associated. But bp on higher side today. You may have some white coat hypertension today. Check bp 2-3 times a day between now and Friday. Then my chart your readings. May make decrease on amlodipine dose if again confirmed low bp but can't do presently with high bp reading.  Check cmp today.  If any bp less than 110/70 then notify us. My cell phone is [phone]. You could try to call me if you do get random low reading. I will try to answer your call. But also note we have doctors on call as well.  Follow up in 3 weeks or as needed   Rhythm Wigfall, Ramon DredgeEdward, [employer]PA-C

## 2016-04-22 NOTE — Progress Notes (Signed)
Pre visit review using our clinic review tool, if applicable. No additional management support is needed unless otherwise documented below in the visit note/SLS  

## 2016-04-24 ENCOUNTER — Encounter: Payer: Self-pay | Admitting: Medical

## 2016-04-24 ENCOUNTER — Telehealth: Payer: Self-pay | Admitting: Medical

## 2016-04-24 MED ORDER — LISINOPRIL 5 MG PO TABS: 5.0000 mg | ORAL_TABLET | Freq: Every day | ORAL | 0 refills | Status: DC

## 2016-04-24 MED ORDER — AMLODIPINE BESYLATE 2.5 MG PO TABS: 2.5000 mg | ORAL_TABLET | Freq: Every day | ORAL | 0 refills | Status: DC

## 2016-04-24 NOTE — Telephone Encounter (Signed)
Patient called stating he is experiencing a decrease in his BP. Wanted to speak with his provider but was informed that our protocol is to transfer him to [facility] to speak with a Nurse and a note would be sent to provider making him aware of what is going on. Patient understood and was transferred to [facility].

## 2016-04-24 NOTE — Telephone Encounter (Signed)
Pt this morning 117/80 this am. He decided not to take his lisinopril. He took coreg at 5 pm only one time. At work he felt weak 98/63 at work. It was 123/81 later.   Pt has not taken norvasc yet tonight. Pt bp 133/85.     He was having high bp readings in our office and had concern for white coat htn.  I offered pt a work note not to work. He wants to work. Recommend not to work  but he feels has to.  Advised no amlodipine tonight. Then will take 1/2 tab 10 mg lisinopril . Continue coreg same and start norvasc 2.5 mg at night. Check bp over the weekend. Follow up Monday at 8:30.

## 2016-04-24 NOTE — Telephone Encounter (Signed)
Patient Name: Samuel Cantrell DOB: [birth year] Initial Comment Caller has low BP, 89/68 Nurse Assessment Nurse: Samuel BarreJones, RN, Miranda Date/Time (Eastern Time): 04/24/2016 12:36:43 PM Confirm and document reason for call. If symptomatic, describe symptoms. ---Caller states he just started on BP medication about 2 months ago. Sunday he was seen in ED for syncope. Had a follow up appt on Tuesday. Today his BP is 89/68 after taking blood pressure medication. He is feeling ok and does not feel like he is about to passout. The provider said to call if BP is < 110/70. Reviewed notes in EPIC and the provider did indeed say to call right away and even provided the caller with his cell phone number. Does the patient have any new or worsening symptoms? ---Yes Will a triage be completed? ---No Select reason for no triage. ---Other Please document clinical information provided and list any resource used. ---Called office based on the notes in EPIC and spoke with [place]. She will pass the message to the provider and they will contact the caller. Guidelines Guideline Title Affirmed Question Affirmed Notes Final Disposition User Clinical Call Samuel BarreJones, RN, Samuel Cantrell

## 2016-04-25 ENCOUNTER — Telehealth: Payer: Self-pay | Admitting: Medical

## 2016-04-25 MED ORDER — POTASSIUM CHLORIDE ER 10 MEQ PO TBCR: EXTENDED_RELEASE_TABLET | ORAL | 0 refills | Status: DC

## 2016-04-25 MED ORDER — POTASSIUM CHLORIDE ER 10 MEQ PO TBCR: 10.0000 meq | EXTENDED_RELEASE_TABLET | Freq: Every day | ORAL | 0 refills | Status: DC

## 2016-04-25 NOTE — Telephone Encounter (Signed)
I had wanted him scheduled for 8:45 for his convenience. I need to see him. 11:15 he will wait longer. Please explain that to patient I had actually put him in at 8:45. Hopefully that was not changed. Offer him earlier and explain about wait time.

## 2016-04-25 NOTE — Telephone Encounter (Signed)
Patient notified. Will be in at 8:45 on Monday

## 2016-04-25 NOTE — Telephone Encounter (Signed)
rx kdur sent to pharmacy

## 2016-04-25 NOTE — Telephone Encounter (Signed)
Caller name: Relationship to patient: Self Can be reached: [phone]  Pharmacy:  Reason for call: Patient states provider wanted to see him Monday morning early for BP follow up. Scheduled for 11:15. Does provider want him in earlier or scheduled with nurse for BP check?

## 2016-04-28 ENCOUNTER — Ambulatory Visit: Payer: BLUE CROSS/BLUE SHIELD | Admitting: Medical

## 2016-04-28 ENCOUNTER — Ambulatory Visit (INDEPENDENT_AMBULATORY_CARE_PROVIDER_SITE_OTHER): Payer: BLUE CROSS/BLUE SHIELD | Admitting: Medical

## 2016-04-28 ENCOUNTER — Encounter: Payer: Self-pay | Admitting: Medical

## 2016-04-28 VITALS — BP 130/80 | HR 85 | Ht 68.0 in | Wt 132.0 lb

## 2016-04-28 DIAGNOSIS — I10 Essential (primary) hypertension: Secondary | ICD-10-CM

## 2016-04-28 NOTE — Progress Notes (Signed)
Subjective:    Patient ID: Samuel Cantrell, male    DOB: March 16, 1975, 41 y.o.   MRN: [id number]  HPI  Pt in for follow up.  This am his bp was 138/82. Yesterday his bp was 123/86. Pt states checked both Saturday and Sunday and his bp were in the 120/80 range.  Pt was started on lower dose bp meds since had very low readings recently as noted on Friday night. See note in epic.   I reduced him to amlodipine 2.5 mg a day. Lisinopril 5 mg a day. Staying on carvedilol 12.5 mg bid.    Review of Systems  Respiratory: Negative for cough, chest tightness, shortness of breath and wheezing.   Cardiovascular: Negative for chest pain and palpitations.  Gastrointestinal: Negative for abdominal pain.  Musculoskeletal: Negative for back pain.  Skin: Negative for rash.  Neurological: Negative for dizziness, numbness and headaches.  Hematological: Negative for adenopathy. Does not bruise/bleed easily.  Psychiatric/Behavioral: Negative for behavioral problems, confusion, self-injury and sleep disturbance. The patient is not nervous/anxious.    Past Medical History:  Diagnosis Date  . Allergy   . GERD (gastroesophageal reflux disease)   . Hypertensive urgency 02/28/2016     Social History   Social History  . Marital status: Married    Spouse name: N/A  . Number of children: N/A  . Years of education: N/A   Occupational History  . Utility Locator    Social History Main Topics  . Smoking status: Current Every Day Smoker    Packs/day: 0.50    Years: 8.00    Types: Cigarettes  . Smokeless tobacco: Never Used  . Alcohol use Yes     Comment: every other night 2 beers  . Drug use: No  . Sexual activity: Yes   Other Topics Concern  . Not on file   Social History Narrative   Pt lives with wife.    Past Surgical History:  Procedure Laterality Date  . NO PAST SURGERIES      Family History  Problem Relation Age of Onset  . Hypertension Mother     No Known  Allergies  Current Outpatient Prescriptions on File Prior to Visit  Medication Sig Dispense Refill  . amLODipine (NORVASC) 2.5 MG tablet Take 1 tablet (2.5 mg total) by mouth daily. Need to reduce dose due to low bp. 30 tablet 0  . carvedilol (COREG) 12.5 MG tablet Take 1 tablet (12.5 mg total) by mouth 2 (two) times daily with a meal. 60 tablet 2  . diphenhydrAMINE (BENADRYL) 25 MG tablet Take 25 mg by mouth every 6 (six) hours as needed for itching or allergies.    [name] Kitchen lisinopril (PRINIVIL,ZESTRIL) 5 MG tablet Take 1 tablet (5 mg total) by mouth daily. Need to reduce dose to due low bp 30 tablet 0  . potassium chloride (K-DUR) 10 MEQ tablet 1 tab po q every other day 30 tablet 0  . potassium chloride (K-DUR) 10 MEQ tablet Take 1 tablet (10 mEq total) by mouth daily. 7 tablet 0   No current facility-administered medications on file prior to visit.     BP 130/80       Objective:   Physical Exam   General Mental Status- Alert. General Appearance- Not in acute distress.   Skin General: Color- Normal Color. Moisture- Normal Moisture.  Neck Carotid Arteries- Normal color. Moisture- Normal Moisture. No carotid bruits. No JVD.  Chest and Lung Exam Auscultation: Breath Sounds:-Normal.  Cardiovascular Auscultation:Rythm- Regular. Murmurs &  Other Heart Sounds:Auscultation of the heart reveals- No Murmurs.  Abdomen Inspection:-Inspeection Normal. Palpation/Percussion:Note:No mass. Palpation and Percussion of the abdomen reveal- Non Tender, Non Distended + BS, no rebound or guarding.    Neurologic Cranial Nerve exam:- CN III-XII intact(No nystagmus), symmetric smile. Strength:- 5/5 equal and symmetric strength both upper and lower extremities.     Assessment & Plan:  Your bp is very well controlled presently. I do want you to continue the current regimen that we changed on Friday night. Keep checking you blood pressure.   For you low potassium take the potassium tablet every  other day.   Keep checking bp daily. I would be  satisfied  with bp between 120/70 and 140/90 range. If lower or higher notify us.  Follow up in one month or as needed.  Nivan Melendrez, Ramon DredgeEdward, PA-C

## 2016-04-28 NOTE — Progress Notes (Signed)
Roomed by Provider/SLS 02/19

## 2016-04-28 NOTE — Patient Instructions (Addendum)
Your bp is very well controlled presently. I do want you to continue the current regimen that we changed on Friday night. Keep checking you blood pressure.   For you low potassium take the potassium tablet every other day.   Keep checking bp daily. I would  Be satisfied with bp between 120/70 and 140/90 range. If lower or higher notify us.  Follow up in one month or as needed.

## 2016-05-25 ENCOUNTER — Other Ambulatory Visit: Payer: Self-pay | Admitting: Medical

## 2016-05-26 NOTE — Telephone Encounter (Signed)
Pt called in to follow up on refill request. He says that he is out of medication. Pt would like to have Rx go to:    [facility] [phone] - [place], [place] - [address] [facility] [address]

## 2016-05-26 NOTE — Telephone Encounter (Signed)
Patient calling back checking on the status of medication request, informed patient of medication refill policy, he voice understanding and states in the future he will call 24 hour in advance. Please advise if patient Rx will be filled today due to patient being completely out.

## 2016-05-27 NOTE — Telephone Encounter (Signed)
Refill sent per [facility] refill protocol/SLS  

## 2016-05-27 NOTE — Telephone Encounter (Signed)
Patient calling back checking on the status of medication request mentioned below, please advise patient directly

## 2016-06-04 ENCOUNTER — Telehealth: Payer: Self-pay | Admitting: Medical

## 2016-06-04 NOTE — Telephone Encounter (Signed)
He can take tylenol for pain. No ibuprofen or alleve as this can increase blood pressure. But what pain is he having. Offer appointment next week if he needs to be seen for area of pain.

## 2016-06-04 NOTE — Telephone Encounter (Signed)
Caller name: Relationship to patient: Self Can be reached: [phone] Pharmacy:  Reason for call: Patient request call back to find out what pain meds he can take with his BP meds

## 2016-06-05 NOTE — Telephone Encounter (Signed)
Patient informed. 

## 2016-07-25 ENCOUNTER — Other Ambulatory Visit: Payer: Self-pay | Admitting: Medical

## 2016-07-28 NOTE — Telephone Encounter (Signed)
Relation to ZO:XWRUpt:self Call back number:[phone] [id number] [place] - [phone] AT [facility] [address] [phone]) [phone] (Fax)     Reason for call:  Patient checking on medication request for carvedilol (COREG) 12.5 MG tablet, patient states he's completely out, patient would like a call when Rx is sent,  please advise.

## 2016-08-19 ENCOUNTER — Telehealth: Payer: Self-pay | Admitting: Medical

## 2016-08-19 MED ORDER — LISINOPRIL 5 MG PO TABS: ORAL_TABLET | ORAL | 2 refills | Status: DC

## 2016-08-19 NOTE — Telephone Encounter (Signed)
Rx sent to pharmacy   

## 2016-08-19 NOTE — Telephone Encounter (Signed)
Caller name: Relationship to patient: Self  Can be reached: [phone]  [facility]  [id number] [place] - [phone] [facility] [address] [phone] ([phone] (Fax)   Reason for call: Request 2 month supply of lisinopril (PRINIVIL,ZESTRIL) 5 MG tablet

## 2016-08-27 ENCOUNTER — Telehealth: Payer: Self-pay | Admitting: Medical

## 2016-08-27 NOTE — Telephone Encounter (Signed)
Patient Name: Samuel Cantrell  DOB: Apr 25, 1975    Initial Comment Caller states his BP is 97/77, he's feeling very weak. Dizziness.   Nurse Assessment  Nurse: Annye Englisharmon, RN, Denise Date/Time (Eastern Time): 08/27/2016 1:07:18 PM  Confirm and document reason for call. If symptomatic, describe symptoms. ---Caller states his BP is 97/77, he's feeling very weak w/dizziness. Drank some fluids and now BP is 114/110 (?). Had pt recheck BP while on the phone.  Does the patient have any new or worsening symptoms? ---Yes  Will a triage be completed? ---Yes  Related visit to physician within the last 2 weeks? ---No  Does the PT have any chronic conditions? (i.e. diabetes, asthma, etc.) ---Yes  List chronic conditions. ---HTN, "Irreg heart beat" but does not know what diag is for this.  Is this a behavioral health or substance abuse call? ---No     Guidelines    Guideline Title Affirmed Question Affirmed Notes  Low Blood Pressure [1] Fall in systolic BP > 20 mm Hg from normal AND [2] NOT dizzy, lightheaded, or weak    Final Disposition User   See PCP When Office is Open (within 3 days) Carmon, RN, Angelique Blonderenise    Comments  Had pt recheck BP while on phone, and his bp currently is 119/75, HR 109. He is currently working outside, and he reports after drinking fluids, he feels much better. He had to walk to the car to get his BP monitor while on the phone, therefore HR is 109, but he denies irreg rate or feeling of heart skipping beats. Avg BP is 140/95 w/use of antihypertensive meds. States he will call the MD to make an appt.   Referrals  REFERRED TO PCP OFFICE   Disagree/Comply: Comply

## 2016-08-28 ENCOUNTER — Other Ambulatory Visit: Payer: Self-pay | Admitting: Medical

## 2016-09-05 ENCOUNTER — Telehealth: Payer: Self-pay | Admitting: Medical

## 2016-09-05 NOTE — Telephone Encounter (Signed)
Follow up call made to patient. States he is still feeling symptoms but they are better. Asked patient if he would like for ne to get him scheduled to be seen. States he will call me back. He needs to check with his boss to see if he can take off for appointment.

## 2016-09-05 NOTE — Telephone Encounter (Signed)
Patient Name: Samuel Cantrell DOB: 10/11/1975 Initial Comment dizziness, BP 117/77, does have hypertension, dr told him to call if BP gets close to 110 Nurse Assessment Nurse: [name], RN, [name] Date/Time (Eastern Time): 09/05/2016 11:13:00 AM Confirm and document reason for call. If symptomatic, describe symptoms. ---Caller states he has HTN and now his BP is 117/77 and he has an irregular heartbeat. He is on Lisinopril, Norvas, and Coreg. He is dizzy and his doctor told him to call when his top number got close to 110. Does the patient have any new or worsening symptoms? ---Yes Will a triage be completed? ---Yes Related visit to physician within the last 2 weeks? ---No Does the PT have any chronic conditions? (i.e. diabetes, asthma, etc.) ---Yes List chronic conditions. ---HTN, irregular heart beat Is this a behavioral health or substance abuse call? ---No Guidelines Guideline Title Affirmed Question Affirmed Notes Low Blood Pressure [1] Fall in systolic BP > 20 mm Hg from normal AND [2] dizzy, lightheaded, or weak Final Disposition User Go to ED Now (or PCP triage) [name], RN, [name] Referrals GO TO FACILITY UNDECIDED Disagree/Comply: Disagree Disagree/Comply Reason: Disagree with instructions

## 2016-09-21 ENCOUNTER — Other Ambulatory Visit: Payer: Self-pay | Admitting: Medical

## 2016-09-23 ENCOUNTER — Telehealth: Payer: Self-pay | Admitting: Medical

## 2016-09-23 ENCOUNTER — Other Ambulatory Visit: Payer: Self-pay | Admitting: Medical

## 2016-09-23 NOTE — Telephone Encounter (Signed)
Relation to ZO:XWRUpt:self Call back [phone] [id number] [place] - [phone] [facility] [address] [phone] ([phone])     Reason for call:  Patient requesting a 3 month supply of   And carvedilol (COREG) 12.5 MG tablet

## 2016-09-24 ENCOUNTER — Telehealth: Payer: Self-pay | Admitting: Medical

## 2016-09-24 MED ORDER — CARVEDILOL 12.5 MG PO TABS: ORAL_TABLET | ORAL | 0 refills | Status: DC

## 2016-09-24 NOTE — Telephone Encounter (Signed)
Caller name: Diane Relation to pt: Tech Call back number: [phone] Pharmacy:  Reason for call: Pt needing refill on

## 2016-09-24 NOTE — Addendum Note (Signed)
Addended by: Orlene OchRENCE, Brigette Hopfer N on: 09/24/2016 02:50 PM   Modules accepted: Orders

## 2016-10-24 ENCOUNTER — Other Ambulatory Visit: Payer: Self-pay | Admitting: Medical

## 2016-10-30 ENCOUNTER — Other Ambulatory Visit: Payer: Self-pay

## 2016-10-30 MED ORDER — AMLODIPINE BESYLATE 2.5 MG PO TABS: ORAL_TABLET | ORAL | 0 refills | Status: DC

## 2016-10-30 NOTE — Telephone Encounter (Signed)
Faxed Amlodipine to pharmacy for 30d/thx dmf

## 2016-11-06 ENCOUNTER — Telehealth: Payer: Self-pay | Admitting: Medical

## 2016-11-06 ENCOUNTER — Ambulatory Visit (HOSPITAL_BASED_OUTPATIENT_CLINIC_OR_DEPARTMENT_OTHER)
Admission: RE | Admit: 2016-11-06 | Discharge: 2016-11-06 | Disposition: A | Discharge status: Home / Self Care | Payer: BLUE CROSS/BLUE SHIELD | Source: Ambulatory Visit | Attending: Medical | Admitting: Medical

## 2016-11-06 ENCOUNTER — Encounter: Payer: Self-pay | Admitting: Medical

## 2016-11-06 ENCOUNTER — Ambulatory Visit (INDEPENDENT_AMBULATORY_CARE_PROVIDER_SITE_OTHER): Payer: BLUE CROSS/BLUE SHIELD | Admitting: Medical

## 2016-11-06 VITALS — BP 150/90 | HR 85 | Temp 98.6°F | Resp 16 | Ht 68.0 in | Wt 126.6 lb

## 2016-11-06 DIAGNOSIS — I10 Essential (primary) hypertension: Secondary | ICD-10-CM | POA: Diagnosis not present

## 2016-11-06 DIAGNOSIS — M79601 Pain in right arm: Secondary | ICD-10-CM | POA: Insufficient documentation

## 2016-11-06 DIAGNOSIS — M25521 Pain in right elbow: Secondary | ICD-10-CM

## 2016-11-06 DIAGNOSIS — I82621 Acute embolism and thrombosis of deep veins of right upper extremity: Secondary | ICD-10-CM | POA: Insufficient documentation

## 2016-11-06 DIAGNOSIS — L089 Local infection of the skin and subcutaneous tissue, unspecified: Secondary | ICD-10-CM | POA: Diagnosis not present

## 2016-11-06 MED ORDER — RIVAROXABAN 20 MG PO TABS: 20.0000 mg | ORAL_TABLET | Freq: Every day | ORAL | 1 refills | Status: DC

## 2016-11-06 MED ORDER — DOXYCYCLINE HYCLATE 100 MG PO TABS: 100.0000 mg | ORAL_TABLET | Freq: Two times a day (BID) | ORAL | 0 refills | Status: DC

## 2016-11-06 MED ORDER — RIVAROXABAN 15 MG PO TABS: 15.0000 mg | ORAL_TABLET | Freq: Two times a day (BID) | ORAL | 0 refills | Status: DC

## 2016-11-06 NOTE — Telephone Encounter (Signed)
Open to order the Xarelto he is to take after he finishes his sample tablets

## 2016-11-06 NOTE — Progress Notes (Signed)
Subjective:    Patient ID: Samuel Cantrell, male    DOB: 1975/07/15, 41 y.o.   MRN: [id number]  HPI  Pt states small tender not medial elbow area that came up 2 days ago. No trauma to area. Then yesterday his arm in that area feels little swollen. More swollen proximal mid forearm. But also described some pain that runs up his medial bicep area.   Pt states when his bp is 140/90 or so he feels good. When his blood pressure diastolic less than 80 he feels weak. He admits he is inconsistent with amlodipine. For example today despite his bp being initially high he feels no cardiac or neurologic signs or symptoms.   Review of Systems  Constitutional: Negative for chills, fatigue and fever.  Respiratory: Negative for chest tightness, shortness of breath and wheezing.   Cardiovascular: Negative for chest pain and palpitations.  Musculoskeletal:       Arm pain. See hpi.  Skin:       farint redness to arm. Faint warmth.  Neurological: Negative for dizziness, speech difficulty, weakness and headaches.  Hematological: Negative for adenopathy. Does not bruise/bleed easily.  Psychiatric/Behavioral: Negative for behavioral problems, confusion, self-injury and sleep disturbance. The patient is not nervous/anxious.      Past Medical History:  Diagnosis Date  . Allergy   . GERD (gastroesophageal reflux disease)   . Hypertensive urgency 02/28/2016     Social History   Social History  . Marital status: Married    Spouse name: N/A  . Number of children: N/A  . Years of education: N/A   Occupational History  . Utility Locator    Social History Main Topics  . Smoking status: Current Every Day Smoker    Packs/day: 0.50    Years: 8.00    Types: Cigarettes  . Smokeless tobacco: Never Used  . Alcohol use Yes     Comment: every other night 2 beers  . Drug use: No  . Sexual activity: Yes   Other Topics Concern  . Not on file   Social History Narrative   Pt lives with wife.     Past Surgical History:  Procedure Laterality Date  . NO PAST SURGERIES      Family History  Problem Relation Age of Onset  . Hypertension Mother     No Known Allergies  Current Outpatient Prescriptions on File Prior to Visit  Medication Sig Dispense Refill  . amLODipine (NORVASC) 2.5 MG tablet TAKE 1 TABLET(2.5 MG) BY MOUTH DAILY 30 tablet 0  . carvedilol (COREG) 12.5 MG tablet TAKE 1 TABLET(12.5 MG) BY MOUTH TWICE DAILY WITH A MEAL 180 tablet 0  . diphenhydrAMINE (BENADRYL) 25 MG tablet Take 25 mg by mouth every 6 (six) hours as needed for itching or allergies.    . fluticasone (FLONASE) 50 MCG/ACT nasal spray SHAKE LIQUID AND USE 2 SPRAYS IN EACH NOSTRIL DAILY 16 g 0  . lisinopril (PRINIVIL,ZESTRIL) 5 MG tablet TAKE 1 TABLET(5 MG) BY MOUTH DAILY 30 tablet 2  . potassium chloride (K-DUR) 10 MEQ tablet Take 1 tablet (10 mEq total) by mouth daily. 7 tablet 0  . potassium chloride (K-DUR,KLOR-CON) 10 MEQ tablet TAKE 1 TABLET BY MOUTH DAILY FOR 7 DAYS, THEN TAKE 1 TABLET EVERY OTHER DAY 30 tablet 0   No current facility-administered medications on file prior to visit.     BP (!) 160/106   Pulse 85   Temp 98.6 F (37 C) (Oral)   Resp 16  Ht 5\' 8"  (1.727 m)   Wt 126 lb 9.6 oz (57.4 kg)   SpO2 100%   BMI 19.25 kg/m       Objective:   Physical Exam  General Mental Status- Alert. General Appearance- Not in acute distress.   Skin General: Color- Normal Color. Moisture- Normal Moisture.  Neck Carotid Arteries- Normal color. Moisture- Normal Moisture. No carotid bruits. No JVD.  Chest and Lung Exam Auscultation: Breath Sounds:-Normal.  Cardiovascular Auscultation:Rythm- Regular. Murmurs & Other Heart Sounds:Auscultation of the heart reveals- No Murmurs.  Abdomen Inspection:-Inspeection Normal. Palpation/Percussion:Note:No mass. Palpation and Percussion of the abdomen reveal- Non Tender, Non Distended + BS, no rebound or guarding.  Neurologic Cranial Nerve  exam:- CN III-XII intact(No nystagmus), symmetric smile. Strength:- 5/5 equal and symmetric strength both upper and lower extremities.  Right upper extremity-on auscultation of the brachial region no bruit heard. The bicep does not appear swollen. Right medial aspect of the elbow he does seem to have a prominent vein that feels a tight, hard and tender. He has a very slight faint redness to the medial elbow and proximal forearm. Forearm does look a little bit swollen in the medial aspect.    Assessment & Plan:  By exam and recent history I am concerned that you might have superficial phlebitis in the medial elbow region. But also the pain in the upper bicep region causes concern for possible deep vein thrombosis. So I'm placing order to get ultrasound of right upper extremity tonight. We'll let you know the results when they come in.  Also on exam do have some faint redness and warmth to the right elbow and proximal forearm region. So some concern for possible early skin infection. Prescription of doxycycline written today. Rx advising given.  Your blood pressure is high today and you admit not taking amlodipine on a regular basis. Please restart amlodipine and all medications for your blood pressure. I want you to check her blood pressure daily for the next 7 days and let me know what your readings are.  Follow-up this coming Wednesday or as needed.    I was notified by radiology department regarding patient's right upper extremity ultrasound and DVT findings. Patient was told to wait but eventually he left and he lives about 15-20 minutes from the office. I did eventually call him but he did not want to come back to the office to pick up samples of Xarelto. He did agree to come back tomorrow morning at 8:00 to pick up samples. I advised him that if he gets any shortness of breath or chest pain that he is to go to the emergency department tonight. He expressed understanding and agreed. Will leave  samples of Xarelto upfront with receptionist. He will start medication tomorrow morning first thing. I have plans to go ahead and refer him to hematologist to get opinion on potential cause of DVT of upper extremity.  Also called patient back and in light of the DVT findings I advised him not to start the antibiotic.  Amarachi Kotz, Ramon Dredge, PA-C

## 2016-11-06 NOTE — Patient Instructions (Addendum)
By exam and recent history I am concerned that you might have superficial phlebitis in the medial elbow region. But also the pain in the upper bicep region causes concern for possible deep vein thrombosis. So I'm placing order to get ultrasound of right upper extremity tonight. We'll let you know the results when they come in.  Also on exam do have some faint redness and warmth to the right elbow and proximal forearm region. So some concern for possible early skin infection. Prescription of doxycycline written today. Rx advising given.  Your blood pressure is high today and you admit not taking amlodipine on a regular basis. Please restart amlodipine and all medications for your blood pressure. I want you to check her blood pressure daily for the next 7 days and let me know what your readings are.  Follow-up this coming Wednesday or as needed.    I was notified by radiology department regarding patient's right upper extremity ultrasound and DVT findings. Patient was told to wait but eventually he left and he lives about 15-20 minutes from the office. I did eventually call him but he did not want to come back to the office to pick up samples of Xarelto. He did agree to come back tomorrow morning at 8:00 to pick up samples. I advised him that if he gets any shortness of breath or chest pain that he is to go to the emergency department tonight. He expressed understanding and agreed. Will leave samples of Xarelto upfront with receptionist. He will start medication tomorrow morning first thing. I have plans to go ahead and refer him to hematologist to get opinion on potential cause of DVT of upper extremity.  Also called patient back and in light of the DVT findings I advised him not to start the antibiotic.

## 2016-11-06 NOTE — Telephone Encounter (Signed)
Patient is picking up Xarelto 15 mg sample tablets tomorrow morning /Friday the 31st..   Instruction is twice daily.  Advise him that prescription for further Xarelto is sent to his  Pharmacy. He takes a 15 mg twice a day for total of 3 weeks. Then he starts to take 20 mg once daily

## 2016-11-07 ENCOUNTER — Telehealth: Payer: Self-pay | Admitting: *Deleted

## 2016-11-07 NOTE — Telephone Encounter (Signed)
Prior Authorization fax request received from [employer] for Xarelto 15mg  BID, completed as much as possible; forwarded to provider for signature to fax/SLS 08/31

## 2016-11-07 NOTE — Telephone Encounter (Signed)
PA Form faxed, [facility] to hold if anything is further needed from Insurance/SLS 08/31

## 2016-11-11 ENCOUNTER — Telehealth: Payer: Self-pay | Admitting: Medical

## 2016-11-11 NOTE — Telephone Encounter (Addendum)
Entry error

## 2016-11-11 NOTE — Telephone Encounter (Signed)
[facility] informed of PA approval.  

## 2016-11-11 NOTE — Telephone Encounter (Addendum)
PA initiated via Covermymeds; KEY: [id number]. Awaiting determination.

## 2016-11-11 NOTE — Telephone Encounter (Signed)
Relation to ZO:XWRUpt:self Call back number:[phone] [id number] [place] - [phone] [facility] [address] [phone]) [phone])     Reason for call:  Patient states pharmacy advised  Rivaroxaban (XARELTO) 15 MG TABS tablet needs authorization from PCP, patient states he's completely out of samples,please advise

## 2016-11-11 NOTE — Telephone Encounter (Signed)
CaseId:[id number];Status:Approved;Review Type:Prior Auth;Coverage Start Date:10/12/2016;Coverage End Date:11/11/2017;

## 2016-11-12 ENCOUNTER — Encounter: Payer: Self-pay | Admitting: Medical

## 2016-11-12 ENCOUNTER — Ambulatory Visit (INDEPENDENT_AMBULATORY_CARE_PROVIDER_SITE_OTHER): Payer: BLUE CROSS/BLUE SHIELD | Admitting: Medical

## 2016-11-12 ENCOUNTER — Ambulatory Visit: Payer: BLUE CROSS/BLUE SHIELD | Admitting: Medical

## 2016-11-12 VITALS — BP 153/96 | HR 78 | Temp 97.2°F | Resp 16 | Ht 66.0 in | Wt 126.2 lb

## 2016-11-12 DIAGNOSIS — I82621 Acute embolism and thrombosis of deep veins of right upper extremity: Secondary | ICD-10-CM | POA: Diagnosis not present

## 2016-11-12 DIAGNOSIS — R51 Headache: Secondary | ICD-10-CM

## 2016-11-12 DIAGNOSIS — R519 Headache, unspecified: Secondary | ICD-10-CM

## 2016-11-12 DIAGNOSIS — I10 Essential (primary) hypertension: Secondary | ICD-10-CM

## 2016-11-12 NOTE — Patient Instructions (Signed)
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 I have referred you to the hematologist for further workup. If your arm starts to swell or you have worse pain let me know. In that event would re-ultrasound extremity.  For your BP levels which very on a daily basis, will refer you to cardiologist. During the interim before that appointment I want you to check your blood pressure morning, noon and at night. Marked these blood pressure readings down so cardiologist can review. Continue current BP medication regimen.  You had one mild transient headache the other day and other mild recent headache. 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 Let me know what they recommend. Otherwise watch for recurrent headaches. For mild headaches can take Tylenol or aspirin. Although regarding aspirin do want you to not use chronically/daily. If you get headaches at any point with gross motor or sensory deficits as then be seen at emergency department for stat evaluation/imaging. Also remind her for any headaches at you have we would want you to check your blood pressure to see if that may be a factor.  Follow-up date 3-4 weeks or as needed.

## 2016-11-12 NOTE — Progress Notes (Signed)
Subjective:    Patient ID: Samuel Cantrell, male    DOB: [birth year], 41 y.o.   MRN: [id number]  HPI  Pt in for follow up.  Pt is on the xarelto for rt dvt upper extremity. The arm feels better and less swollen. Less painful as well. No chest pain, shoulder pain, or any shortness of breath. No trauma to arm before this came up. No iv drug use.  Pt get occasional frontal ha recently. Just had one two nights ago. Took aspirin and ha went away. No gross motor or sensory function deficits. Mom has aneurysm. She had brain surgery. Pt did not check his bp at time of his ha.  Pt does report blood pressure at night bp 120/80 range sometimes lower. But in office bp readings are higher. Pt has had severe high bp in past that caused troponin elevation. During the day bp readings are 140/90. Pt states at night when his diastolic goes less than 80 feels weak.  Review of Systems  Constitutional: Negative for chills, fatigue and fever.  Respiratory: Negative for cough, chest tightness, shortness of breath and wheezing.   Cardiovascular: Negative for chest pain and palpitations.  Gastrointestinal: Negative for abdominal pain, blood in stool, diarrhea, nausea and vomiting.  Genitourinary: Negative for dysuria, flank pain, frequency, hematuria, penile pain, penile swelling, scrotal swelling, testicular pain and urgency.  Musculoskeletal: Negative for back pain, joint swelling, myalgias and neck stiffness.       Mild rt arm pain.  Skin: Negative for rash.  Neurological: Negative for dizziness, seizures, speech difficulty, weakness, numbness and headaches.       See hpi.  Hematological: Negative for adenopathy. Does not bruise/bleed easily.  Psychiatric/Behavioral: Negative for behavioral problems, decreased concentration, dysphoric mood, hallucinations and suicidal ideas. The patient is not nervous/anxious.      Past Medical History:  Diagnosis Date  . Allergy   . GERD (gastroesophageal reflux  disease)   . Hypertensive urgency 02/28/2016     Social History   Social History  . Marital status: Married    Spouse name: N/A  . Number of children: N/A  . Years of education: N/A   Occupational History  . [employer]    Social History Main Topics  . Smoking status: Current Every Day Smoker    Packs/day: 0.50    Years: 8.00    Types: Cigarettes  . Smokeless tobacco: Never Used  . Alcohol use Yes     Comment: every other night 2 beers  . Drug use: No  . Sexual activity: Yes   Other Topics Concern  . Not on file   Social History Narrative   Pt lives with wife.    Past Surgical History:  Procedure Laterality Date  . NO PAST SURGERIES      Family History  Problem Relation Age of Onset  . Hypertension Mother     No Known Allergies  Current Outpatient Prescriptions on File Prior to Visit  Medication Sig Dispense Refill  . amLODipine (NORVASC) 2.5 MG tablet TAKE 1 TABLET(2.5 MG) BY MOUTH DAILY 30 tablet 0  . carvedilol (COREG) 12.5 MG tablet TAKE 1 TABLET(12.5 MG) BY MOUTH TWICE DAILY WITH A MEAL 180 tablet 0  . diphenhydrAMINE (BENADRYL) 25 MG tablet Take 25 mg by mouth every 6 (six) hours as needed for itching or allergies.    . fluticasone (FLONASE) 50 MCG/ACT nasal spray SHAKE LIQUID AND USE 2 SPRAYS IN EACH NOSTRIL DAILY 16 g 0  . lisinopril (  PRINIVIL,ZESTRIL) 5 MG tablet TAKE 1 TABLET(5 MG) BY MOUTH DAILY 30 tablet 2  . potassium chloride (K-DUR) 10 MEQ tablet Take 1 tablet (10 mEq total) by mouth daily. 7 tablet 0  . Rivaroxaban (XARELTO) 15 MG TABS tablet Take 1 tablet (15 mg total) by mouth 2 (two) times daily with a meal. 28 tablet 0  . rivaroxaban (XARELTO) 20 MG TABS tablet Take 1 tablet (20 mg total) by mouth daily with supper. 30 tablet 1   No current facility-administered medications on file prior to visit.     BP (!) 153/96   Pulse 78   Temp (!) 97.2 F (36.2 C) (Oral)   Resp 16   Ht 5\' 6"  (1.676 m)   Wt 126 lb 3.2 oz (57.2 kg)   SpO2  100%   BMI 20.37 kg/m       Objective:   Physical Exam  General- No acute distress. Pleasant patient. Neck- Full range of motion, no jvd Lungs- Clear, even and unlabored. Heart- regular rate and rhythm. Neurologic- CNII- XII grossly intact.  Rt upper extremity- arm does not appear swollen compared to prior visit. Radial and ulnar pulse intact. No bruit over medial bicep region. The prior vein that was indurated feels less so today on exam.  Neurologic Cranial Nerve exam:- CN III-XII intact(No nystagmus), symmetric smile. Strength:- 5/5 equal and symmetric strength both upper and lower extremities.        Assessment & Plan:  For your deep vein thrombosis right upper extremity continue with xarelto. I have referred you to the hematologist for further workup. If your arm starts to swell or you have worse pain let me know. In that event would re-ultrasound extremity.  For your BP levels which very on a daily basis, will refer you to cardiologist. During the interim before that appointment I want you to check your blood pressure morning, noon and at night. Marked these blood pressure readings down so cardiologist can review. Continue current BP medication regimen.  You had one mild transient headache the other day and other mild recent headache. 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 Let me know what they recommend. Otherwise watch for recurrent headaches. For mild headaches can take Tylenol or aspirin. Although regarding aspirin do want you to not use chronically/daily. If you get headaches at any point with gross motor or sensory deficits as then be seen at emergency department for stat evaluation/imaging. Also remind her for any headaches at you have we would want you to check your blood pressure to see if that may be a factor.  Follow-up date 3-4 weeks or as needed.  Avianna Moynahan, Ramon DredgeEdward, PA-C

## 2016-11-12 NOTE — Telephone Encounter (Incomplete)
Telephone   11/11/2016 [facility]  Saguier, Manli[place]Edward, [place]-C  Family Medicine   PA: Carlena HurlXarelto 15mg   Reason for call   Conversation: PA: Xarelto 15mg   (Newest Message First)  Conrad BurlingtonCanter, Kaylyn, [facility]MA      11/11/16 3:13 PM  Note    [facility] informed of PA approval.     Conrad BurlingtonCanter, Kaylyn, CMA      11/11/16 3:05 PM  Note    CaseId:[id number];Status:Approved;Review Type:Prior Auth;Coverage Start Date:10/12/2016;Coverage End Date:11/11/2017;           11/11/16 11:52 AM  Canter, Vicente MalesKaylyn, CMA routed this conversation to Conrad Burlingtonanter, Kaylyn, CMA . Orlene Ochorrence, Jasmine N, CMA  Conrad BurlingtonCanter, Kaylyn, [place]      11/11/16 11:51 AM  Note    PA initiated via Covermymeds; KEY: [id number]. Awaiting determination.           11/11/16 10:45 AM  Elliot GaultBell, Tiffany M routed this conversation to Orlene Ochorrence, Jasmine N, [place]        11/11/16 10:44 AM    Rhodes L. Deboy contacted Gala MurdochBell, Tiffany M  Bell, Tiffany M      11/11/16 10:39 AM  Note    Relation to EA:VWUJpt:self Call back number:[phone] [id number] [place] [address] [phone] (Phone) [phone])     Reason for call:  Patient states pharmacy advised  Rivaroxaban (XARELTO) 15 MG TABS tablet needs authorization from PCP, patient states he's completely out of samples,please advise

## 2016-11-23 ENCOUNTER — Other Ambulatory Visit: Payer: Self-pay | Admitting: Medical

## 2016-11-25 ENCOUNTER — Other Ambulatory Visit: Payer: Self-pay | Admitting: *Deleted

## 2016-11-25 ENCOUNTER — Other Ambulatory Visit (HOSPITAL_BASED_OUTPATIENT_CLINIC_OR_DEPARTMENT_OTHER): Payer: BLUE CROSS/BLUE SHIELD

## 2016-11-25 ENCOUNTER — Ambulatory Visit (HOSPITAL_BASED_OUTPATIENT_CLINIC_OR_DEPARTMENT_OTHER): Payer: BLUE CROSS/BLUE SHIELD | Admitting: Family

## 2016-11-25 ENCOUNTER — Telehealth: Payer: Self-pay | Admitting: *Deleted

## 2016-11-25 VITALS — BP 129/83 | HR 86 | Temp 98.3°F | Resp 16 | Wt 127.0 lb

## 2016-11-25 DIAGNOSIS — I82621 Acute embolism and thrombosis of deep veins of right upper extremity: Secondary | ICD-10-CM

## 2016-11-25 DIAGNOSIS — R7989 Other specified abnormal findings of blood chemistry: Secondary | ICD-10-CM

## 2016-11-25 DIAGNOSIS — F17200 Nicotine dependence, unspecified, uncomplicated: Secondary | ICD-10-CM

## 2016-11-25 DIAGNOSIS — R945 Abnormal results of liver function studies: Secondary | ICD-10-CM

## 2016-11-25 LAB — CMP (CANCER CENTER ONLY)
ALT: 71 U/L — AB (ref 10–47)
AST: 140 U/L — ABNORMAL HIGH (ref 11–38)
Albumin: 3.4 g/dL (ref 3.3–5.5)
Alkaline Phosphatase: 188 U/L — ABNORMAL HIGH (ref 26–84)
BUN: 7 mg/dL (ref 7–22)
CO2: 31 mEq/L (ref 18–33)
CREATININE: 0.8 mg/dL (ref 0.6–1.2)
Calcium: 9 mg/dL (ref 8.0–10.3)
Chloride: 104 mEq/L (ref 98–108)
Glucose, Bld: 119 mg/dL — ABNORMAL HIGH (ref 73–118)
Potassium: 3.3 mEq/L (ref 3.3–4.7)
Sodium: 142 mEq/L (ref 128–145)
TOTAL PROTEIN: 7.5 g/dL (ref 6.4–8.1)
Total Bilirubin: 0.7 mg/dl (ref 0.20–1.60)

## 2016-11-25 LAB — CBC WITH DIFFERENTIAL (CANCER CENTER ONLY)
BASO#: 0 10*3/uL (ref 0.0–0.2)
BASO%: 0.8 % (ref 0.0–2.0)
EOS%: 3.4 % (ref 0.0–7.0)
Eosinophils Absolute: 0.2 10*3/uL (ref 0.0–0.5)
HCT: 44 % (ref 38.7–49.9)
HEMOGLOBIN: 15.6 g/dL (ref 13.0–17.1)
LYMPH#: 2.9 10*3/uL (ref 0.9–3.3)
LYMPH%: 56.8 % — AB (ref 14.0–48.0)
MCH: 31.7 pg (ref 28.0–33.4)
MCHC: 35.5 g/dL (ref 32.0–35.9)
MCV: 89 fL (ref 82–98)
MONO#: 0.5 10*3/uL (ref 0.1–0.9)
MONO%: 8.9 % (ref 0.0–13.0)
NEUT%: 30.1 % — ABNORMAL LOW (ref 40.0–80.0)
NEUTROS ABS: 1.5 10*3/uL (ref 1.5–6.5)
PLATELETS: 178 10*3/uL (ref 145–400)
RBC: 4.92 10*6/uL (ref 4.20–5.70)
RDW: 14.4 % (ref 11.1–15.7)
WBC: 5.1 10*3/uL (ref 4.0–10.0)

## 2016-11-25 MED ORDER — RIVAROXABAN 20 MG PO TABS: 20.0000 mg | ORAL_TABLET | Freq: Every day | ORAL | 1 refills | Status: DC

## 2016-11-25 MED ORDER — RIVAROXABAN 20 MG PO TABS: 20.0000 mg | ORAL_TABLET | Freq: Every day | ORAL | 11 refills | Status: DC

## 2016-11-25 NOTE — Telephone Encounter (Signed)
Patient was seen today in the office but forgot to ask about the possible connection between his dark urine and Xarelto. He states his urine turned dark after the initiation of Xarelto.   Reviewed this symptom with Emeline Gins NP and she would like patient to hold xarelto until she can confer with Dr Myna Hidalgo tomorrow.  Patient understands. He will wait until he hears back from this office before restarting.

## 2016-11-25 NOTE — Progress Notes (Signed)
Hematology/Oncology Consultation   Name: Samuel Cantrell      MRN: [id number]    Location: Room/bed info not found  Date: 11/25/2016 Time:9:01 AM   REFERRING PHYSICIAN: Esperanza Richters, PA-C   REASON FOR CONSULT: Acute DVT of brachial vein of right upper extremity   DIAGNOSIS:  Acute DVT of brachial vein of right upper extremity  HISTORY OF PRESENT ILLNESS: Samuel Cantrell is a very pleasant 41 yo African American gentleman with recent diagnosis or right upper extremity brachial DVT. He had developed numbness and tingling in his fingertips and swelling at the elbow. He is currently on a Xarelto starter pack with 15 mg PO BID.  The swelling and numbness and tingling in the right arm has resolved. He is doing well on Xarelto and denies any bleeding, bruising or petechiae.  He has no prior history of thrombus. He states that his mother had a blood clot in the brain last year. No other family history of thrombus.  No lymphadenopathy found on exam.  He smokes 10 cigarettes a day. He has several beers a week in the evenings. He denies being diabetic and no problems with his thyroid.  His LFT's are quite elevated today. On exam his left side is soft and right side full but non tender.   No personal history of cancer. Family history includes maternal grandfather with pancreatic cancer.  No sickle cell disease or trait.  No fever, chills, night sweats, n/v, cough, rash, dizziness, SOB, chest pain, palpitations, abdominal pain or changes in bowel or bladder habits.  He has an occasional headache that resolves when he takes an aspirin with food.  No swelling, tenderness, numbness or tingling in his extremities at this time. No c/o pain.  He has maintained a good appetite and is staying well hydrated. His weight is stable.  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  He has had no falls or syncopal episodes.   ROS: All other 10 point review of systems is  negative.   PAST MEDICAL HISTORY:   Past Medical History:  Diagnosis Date  . Allergy   . GERD (gastroesophageal reflux disease)   . Hypertensive urgency 02/28/2016    ALLERGIES: No Known Allergies    MEDICATIONS:  Current Outpatient Prescriptions on File Prior to Visit  Medication Sig Dispense Refill  . amLODipine (NORVASC) 2.5 MG tablet TAKE 1 TABLET(2.5 MG) BY MOUTH DAILY 30 tablet 0  . carvedilol (COREG) 12.5 MG tablet TAKE 1 TABLET(12.5 MG) BY MOUTH TWICE DAILY WITH A MEAL 180 tablet 0  . diphenhydrAMINE (BENADRYL) 25 MG tablet Take 25 mg by mouth every 6 (six) hours as needed for itching or allergies.    . fluticasone (FLONASE) 50 MCG/ACT nasal spray SHAKE LIQUID AND USE 2 SPRAYS IN EACH NOSTRIL DAILY 16 g 0  . lisinopril (PRINIVIL,ZESTRIL) 5 MG tablet TAKE 1 TABLET(5 MG) BY MOUTH DAILY 30 tablet 0  . potassium chloride (K-DUR) 10 MEQ tablet Take 1 tablet (10 mEq total) by mouth daily. 7 tablet 0  . potassium chloride (K-DUR,KLOR-CON) 10 MEQ tablet TAKE 1 TABLET BY MOUTH DAILY FOR 7 DAYS, THEN TAKE 1 TABLET EVERY OTHER DAY 30 tablet 0  . Rivaroxaban (XARELTO) 15 MG TABS tablet Take 1 tablet (15 mg total) by mouth 2 (two) times daily with a meal. 28 tablet 0  . rivaroxaban (XARELTO) 20 MG TABS tablet Take 1 tablet (20 mg total) by mouth daily with supper.  30 tablet 1   No current facility-administered medications on file prior to visit.      PAST SURGICAL HISTORY Past Surgical History:  Procedure Laterality Date  . NO PAST SURGERIES      FAMILY HISTORY: Family History  Problem Relation Age of Onset  . Hypertension Mother     SOCIAL HISTORY:  reports that he has been smoking Cigarettes.  He has a 4.00 pack-year smoking history. He has never used smokeless tobacco. He reports that he drinks alcohol. He reports that he does not use drugs.  PERFORMANCE STATUS: The patient's performance status is 1 - Symptomatic but completely ambulatory  PHYSICAL EXAM: Most Recent  Vital Signs: There were no vitals taken for this visit. BP 129/83 (BP Location: Right Arm, Patient Position: Sitting)   Pulse 86   Temp 98.3 F (36.8 C) (Oral)   Resp 16   Wt 127 lb (57.6 kg)   SpO2 100%   BMI 20.50 kg/m   General Appearance:    Alert, cooperative, no distress, appears stated age  Head:    Normocephalic, without obvious abnormality, atraumatic  Eyes:    PERRL, conjunctiva/corneas clear, EOM's intact, fundi    benign, both eyes             Throat:   Lips, mucosa, and tongue normal; teeth and gums normal  Neck:   Supple, symmetrical, trachea midline, no adenopathy;       thyroid:  No enlargement/tenderness/nodules; no carotid   bruit or JVD  Back:     Symmetric, no curvature, ROM normal, no CVA tenderness  Lungs:     Clear to auscultation bilaterally, respirations unlabored  Chest wall:    No tenderness or deformity  Heart:    Regular rate and rhythm, S1 and S2 normal, no murmur, rub   or gallop  Abdomen:     Soft, non-tender in left side, fullness on right side, bowel sounds active all four quadrants,    no masses, no organomegaly        Extremities:   Extremities normal, atraumatic, no cyanosis or edema  Pulses:   2+ and symmetric all extremities  Skin:   Skin color, texture, turgor normal, no rashes or lesions  Lymph nodes:   Cervical, supraclavicular, and axillary nodes normal  Neurologic:   CNII-XII intact. Normal strength, sensation and reflexes      throughout    LABORATORY DATA:  Results for orders placed or performed in visit on 11/25/16 (from the past 48 hour(s))  CBC with Differential [facility])     Status: Abnormal   Collection Time: 11/25/16  8:18 AM  Result Value Ref Range   WBC 5.1 4.0 - 10.0 10e3/uL   RBC 4.92 4.20 - 5.70 10e6/uL   HGB 15.6 13.0 - 17.1 g/dL   HCT 40.9 81.1 - 91.4 %   MCV 89 82 - 98 fL   MCH 31.7 28.0 - 33.4 pg   MCHC 35.5 32.0 - 35.9 g/dL   RDW 78.2 95.6 - 21.3 %   Platelets 178 145 - 400 10e3/uL   NEUT# 1.5 1.5  - 6.5 10e3/uL   LYMPH# 2.9 0.9 - 3.3 10e3/uL   MONO# 0.5 0.1 - 0.9 10e3/uL   Eosinophils Absolute 0.2 0.0 - 0.5 10e3/uL   BASO# 0.0 0.0 - 0.2 10e3/uL   NEUT% 30.1 (L) 40.0 - 80.0 %   LYMPH% 56.8 (H) 14.0 - 48.0 %   MONO% 8.9 0.0 - 13.0 %   EOS% 3.4 0.0 -  7.0 %   BASO% 0.8 0.0 - 2.0 %  CMP STAT ([facility] only)     Status: Abnormal   Collection Time: 11/25/16  8:18 AM  Result Value Ref Range   Sodium 142 128 - 145 mEq/L   Potassium 3.3 3.3 - 4.7 mEq/L   Chloride 104 98 - 108 mEq/L   CO2 31 18 - 33 mEq/L   Glucose, Bld 119 (H) 73 - 118 mg/dL   BUN, Bld 7 7 - 22 mg/dL   Creat 0.8 0.6 - 1.2 mg/dl   Total Bilirubin 4.09 0.20 - 1.60 mg/dl   Alkaline Phosphatase 188 (H) 26 - 84 U/L   AST 140 (H) 11 - 38 U/L   ALT(SGPT) 71 (H) 10 - 47 U/L   Total Protein 7.5 6.4 - 8.1 g/dL   Albumin 3.4 3.3 - 5.5 g/dL   Calcium 9.0 8.0 - 81.1 mg/dL      RADIOGRAPHY: No results found.     PATHOLOGY: None   ASSESSMENT/PLAN: Mr. Cerreta is a very pleasant 41 yo African American gentleman with recent diagnosis or right upper extremity brachial DVT. He has responded nicely to Xarelto and numbness, tingling and swelling have all resolved in the right arm.  With the DVT and his history of smoking, we will get a chest xray this week for further evaluation.  His LFT's are quite elevated and he is full on the right side on exam. We will also get an abdominal US this week. We will go ahead and  plan to see him back again in another 2 months for repeat lab work, [place] and follow-up.   All questions were answered and he is in agreement with the plan. He will contact our office with any questions or concerns. We can certainly see him sooner if needed.  He was discussed with and also seen by Dr. Myna Hidalgo and he is in agreement with the aforementioned.   [facility] M     Addendum:  I saw and examined the patient with Yamari Ventola. I agree with her above assessment.  We will see if he has any  hypercoagulable conditions. I would think that this would be a little unlikely.  On his exam, his right arm certainly is not swollen. I cannot palpate any obvious venous cord.  Given that he smokes, this might be the risk factor. I definitely would consider a chest x-ray to make sure that there is no upper lobe mass that might be pressing on his vasculature.  He has elevated liver function tests. I'm not sure as to why he has these. We will check a ultrasound of his liver deceive what might be going on.  We spent about 40 minutes with him. He is very nice. He is the husband of one of my patients. I will like to see him back in 2 months. When we see him back, we will get a Doppler of his right arm to see how the thrombus is resolving.  Christin Bach, MD

## 2016-11-27 LAB — ANTITHROMBIN III: ANTITHROMBIN ACTIVITY: 133 % (ref 75–135)

## 2016-11-27 LAB — LUPUS ANTICOAGULANT PANEL
Hexagonal Phase Phospholipid: 0 s (ref 0–11)
PTT-LA Mix: 51.3 s — ABNORMAL HIGH (ref 0.0–48.9)
PTT-LA: 54.6 s — AB (ref 0.0–51.9)
dRVVT Confirm: 1.6 ratio — ABNORMAL HIGH (ref 0.8–1.2)
dRVVT Mix: 100.5 s — ABNORMAL HIGH (ref 0.0–47.0)
dRVVT: 144.7 s — ABNORMAL HIGH (ref 0.0–47.0)

## 2016-11-27 LAB — PROTEIN C, TOTAL: PROTEIN C ANTIGEN: 59 % — AB (ref 60–150)

## 2016-11-27 LAB — PROTEIN C ACTIVITY: PROTEIN C ACTIVITY: 67 % — AB (ref 73–180)

## 2016-11-27 LAB — PROTEIN S ACTIVITY: Protein S-Functional: 176 % — ABNORMAL HIGH (ref 63–140)

## 2016-11-27 LAB — PROTEIN S, TOTAL: PROTEIN S AG TOTAL: 110 % (ref 60–150)

## 2016-11-28 LAB — BETA-2-GLYCOPROTEIN I ABS, IGG/M/A
Beta-2 Glyco 1 IgA: <9 GPI IgA units (ref 0–25)
Beta-2 Glyco 1 IgM: <9 GPI IgM units (ref 0–32)
Beta-2 Glycoprotein I Ab, IgG: <9 GPI IgG units (ref 0–20)

## 2016-11-28 LAB — CARDIOLIPIN ANTIBODIES, IGG, IGM, IGA
Anticardiolipin Ab,IgA,Qn: <9 APL U/mL (ref 0–11)
Anticardiolipin Ab,IgG,Qn: <9 GPL U/mL (ref 0–14)
Anticardiolipin Ab,IgM,Qn: <9 MPL U/mL (ref 0–12)

## 2016-12-01 LAB — PROTHROMBIN GENE MUTATION

## 2016-12-02 LAB — FACTOR 5 LEIDEN

## 2016-12-07 ENCOUNTER — Other Ambulatory Visit: Payer: Self-pay | Admitting: Medical

## 2016-12-07 ENCOUNTER — Other Ambulatory Visit: Payer: Self-pay | Admitting: Family

## 2016-12-07 DIAGNOSIS — I82621 Acute embolism and thrombosis of deep veins of right upper extremity: Secondary | ICD-10-CM

## 2016-12-08 NOTE — Progress Notes (Signed)
Cardiology Office Note    Date:  12/10/2016   ID:  Caffie Damme, DOB 1976/02/13, MRN [id number]  PCP:  Esperanza Richters, PA-C  Cardiologist:  Dr. Eden Emms   Chief Complaint: Hypertension  History of Present Illness:   AH BOTT is a 41 y.o. male hypertension, GERD, right upper extremity DVT (on Xarelto) referred by PCP (Saguier, Ramon Dredge, PA-C) for evaluation of high blood pressure.  Patient was seen by Dr. Eden Emms during admission 02/29/2016 for elevated troponin in setting of hypertensive urgency. Underwent ETT that was negative for ischemia, but he had hypertensive response to stress, good exercise capacity.  Pending abdominal ultrasound for recent elevated LFTs. Pending evaluation by heme hematology for DVT and positive lupus anticoagulant. Mother has a history of brain aneurysm.  Recent is here for blood pressure follow-up. His blood pressure runs in 120-140/80-90s most of the time. Intermittently drops below 100/50. Now less frequent since cutting back on hypertensive regimen. He currently takes Coreg 12.5 MG twice a day, lisinopril 5 mg in a.m. and Norvasc 2.5 mg p.m. He feels "poor with felt like going to pass out" when his blood pressure drops down, mostly in afternoon. Use to occurs 2 times per week, now about once a months. Did not pay attention to pulse. Denies chest pain, palpitation, orthopnea, PND, syncope, melena or blood in his stool or urine.  Past Medical History:  Diagnosis Date  . Allergy   . GERD (gastroesophageal reflux disease)   . Hypertensive urgency 02/28/2016    Past Surgical History:  Procedure Laterality Date  . NO PAST SURGERIES      Current Medications: Prior to Admission medications   Medication Sig Start Date End Date Taking? Authorizing Provider  amLODipine (NORVASC) 2.5 MG tablet TAKE 1 TABLET(2.5 MG) BY MOUTH DAILY 10/30/16   Saguier, Ramon Dredge, PA-C  carvedilol (COREG) 12.5 MG tablet TAKE 1 TABLET(12.5 MG) BY MOUTH TWICE DAILY WITH A  MEAL 09/24/16   Saguier, Ramon Dredge, PA-C  diphenhydrAMINE (BENADRYL) 25 MG tablet Take 25 mg by mouth every 6 (six) hours as needed for itching or allergies.    [provider]  fluticasone (FLONASE) 50 MCG/ACT nasal spray SHAKE LIQUID AND USE 2 SPRAYS IN [facility] NOSTRIL DAILY 08/28/16   Saguier, Ramon Dredge, PA-C  lisinopril (PRINIVIL,ZESTRIL) 5 MG tablet TAKE 1 TABLET(5 MG) BY MOUTH DAILY 11/24/16   Saguier, Ramon Dredge, PA-C  potassium chloride (K-DUR,KLOR-CON) 10 MEQ tablet TAKE 1 TABLET BY MOUTH DAILY FOR 7 DAYS, THEN TAKE 1 TABLET EVERY OTHER DAY 11/24/16   Saguier, Ramon Dredge, PA-C  rivaroxaban (XARELTO) 20 MG TABS tablet Take 1 tablet (20 mg total) by mouth daily with supper. 11/25/16   Josph Macho, MD    Allergies:   Patient has no known allergies.   Social History   Social History  . Marital status: Married    Spouse name: N/A  . Number of children: N/A  . Years of education: N/A   Occupational History  . Utility Locator    Social History Main Topics  . Smoking status: Current Every Day Smoker    Packs/day: 0.50    Years: 8.00    Types: Cigarettes  . Smokeless tobacco: Never Used  . Alcohol use Yes     Comment: every other night 2 beers  . Drug use: No  . Sexual activity: Yes   Other Topics Concern  . None   Social History Narrative   Pt lives with wife.     Family History:  The patient's  family history includes Hypertension in his mother.   ROS:   Please see the history of present illness.    ROS All other systems reviewed and are negative.   PHYSICAL EXAM:   VS:  BP 140/90   Pulse 78   Ht  (1.727 m)   Wt 127 lb (57.6 kg)   SpO2 98%   BMI 19.31 kg/m    GEN: Well nourished, well developed, in no acute distress  HEENT: normal  Neck: no JVD, carotid bruits, or masses Cardiac: RRR; no murmurs, rubs, or gallops,no edema  Respiratory:  clear to auscultation bilaterally, normal work of breathing GI: soft, nontender, nondistended, + BS MS: no deformity or  atrophy  Skin: warm and dry, no rash Neuro:  Alert and Oriented x 3, Strength and sensation are intact Psych: euthymic mood, full affect  Wt Readings from Last 3 Encounters:  12/10/16 127 lb (57.6 kg)  11/25/16 127 lb (57.6 kg)  11/12/16 126 lb 3.2 oz (57.2 kg)      Studies/Labs Reviewed:   EKG:  EKG is ordered today.  The ekg ordered today demonstrates SR at rate of 76 bpm  Recent Labs: 03/01/2016: TSH 1.948 11/25/2016: ALT(SGPT) 71; BUN, Bld 7; Creat 0.8; HGB 15.6; Platelets 178; Potassium 3.3; Sodium 142   Lipid Panel    Component Value Date/Time   CHOL 179 02/29/2016 0457   TRIG 60 02/29/2016 0457   HDL 87 02/29/2016 0457   CHOLHDL 2.1 02/29/2016 0457   VLDL 12 02/29/2016 0457   LDLCALC 80 02/29/2016 0457    Additional studies/ records that were reviewed today include:   Echocardiogram:02/29/16 Study Conclusions  - Left ventricle: The cavity size was normal. There was mild   concentric hypertrophy. Systolic function was normal. The   estimated ejection fraction was in the range of 60% to 65%. Wall   motion was normal; there were no regional wall motion   abnormalities. Doppler parameters are consistent with abnormal   left ventricular relaxation (grade 1 diastolic dysfunction). - Aortic valve: Transvalvular velocity was within the normal range.   There was no stenosis. There was no regurgitation. - Mitral valve: Transvalvular velocity was within the normal range.   There was no evidence for stenosis. There was no regurgitation. - Right ventricle: The cavity size was normal. Wall thickness was   normal. Systolic function was normal. - Tricuspid valve: There was trivial regurgitation.  ETT: 03/01/16  Blood pressure demonstrated a hypertensive response to exercise.  There was no ST segment deviation noted during stress.  No T wave inversion was noted during stress.    ASSESSMENT & PLAN:    1. HTN - His fluctuating blood pressure improved since lowering  antihypertensive regimen. Now occurring once every few weeks. Advised to check blood pressure before taking antihypertensive regimen. Hold if BP below 100/50. Drink plenty of water when blood pressure is low. He will keep a log of blood pressure and bring during next office visit. No change in regimen. He will call us if worsening of symptoms. Asymptomatic while standing from sitting or laying position. At length discussion regarding plan, patient and significant other agree with it.   2. R upper extremity DVT - Pending evaluation by hematology. Continue Xarelto.     Medication Adjustments/Labs and Tests Ordered: Current medicines are reviewed at length with the patient today.  Concerns regarding medicines are outlined above.  Medication changes, Labs and Tests ordered today are listed in the Patient Instructions below. Patient Instructions  Medication Instructions: Your physician recommends that you continue on your current medications as directed. Please refer to the Current Medication list given to you today.  Labwork: None Ordered  Procedures/Testing: None Ordered  Follow-Up: Your physician recommends that you schedule a follow-up appointment in: 2 MONTHS with Dr. Eden Emms    If you need a refill on your cardiac medications before your next appointment, please call your pharmacy.      Lorelei Pont, PA  12/10/2016 10:09 AM    [facility] [address] [place], [place], [place]  [address] Phone: [phone]; Fax: [phone]

## 2016-12-09 ENCOUNTER — Telehealth: Payer: Self-pay | Admitting: Family

## 2016-12-09 NOTE — Telephone Encounter (Signed)
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 He verbalized understanding and will continue on his same Xarelto regimen, 20 mg PO Daily. He will follow-up with Delice Bison regarding financial assistance. He will have a chest xray and repeat US tomorrow and we will call him the results once they are available.

## 2016-12-09 NOTE — Telephone Encounter (Signed)
No answer, left call back number for patient.

## 2016-12-10 ENCOUNTER — Encounter: Payer: Self-pay | Admitting: Physician Assistant

## 2016-12-10 ENCOUNTER — Ambulatory Visit (HOSPITAL_BASED_OUTPATIENT_CLINIC_OR_DEPARTMENT_OTHER)
Admission: RE | Admit: 2016-12-10 | Discharge: 2016-12-10 | Disposition: A | Discharge status: Home / Self Care | Payer: BLUE CROSS/BLUE SHIELD | Source: Ambulatory Visit | Attending: Family | Admitting: Family

## 2016-12-10 ENCOUNTER — Ambulatory Visit (INDEPENDENT_AMBULATORY_CARE_PROVIDER_SITE_OTHER): Payer: BLUE CROSS/BLUE SHIELD | Admitting: Physician Assistant

## 2016-12-10 VITALS — BP 140/90 | HR 78 | Ht 68.0 in | Wt 127.0 lb

## 2016-12-10 DIAGNOSIS — F17200 Nicotine dependence, unspecified, uncomplicated: Secondary | ICD-10-CM | POA: Insufficient documentation

## 2016-12-10 DIAGNOSIS — R7989 Other specified abnormal findings of blood chemistry: Secondary | ICD-10-CM

## 2016-12-10 DIAGNOSIS — I82621 Acute embolism and thrombosis of deep veins of right upper extremity: Secondary | ICD-10-CM | POA: Insufficient documentation

## 2016-12-10 DIAGNOSIS — R945 Abnormal results of liver function studies: Secondary | ICD-10-CM | POA: Diagnosis present

## 2016-12-10 DIAGNOSIS — I10 Essential (primary) hypertension: Secondary | ICD-10-CM

## 2016-12-10 NOTE — Patient Instructions (Signed)
Medication Instructions: Your physician recommends that you continue on your current medications as directed. Please refer to the Current Medication list given to you today.  Labwork: None Ordered  Procedures/Testing: None Ordered  Follow-Up: Your physician recommends that you schedule a follow-up appointment in: 2 MONTHS with Dr. Eden Emms    If you need a refill on your cardiac medications before your next appointment, please call your pharmacy.

## 2016-12-11 ENCOUNTER — Telehealth: Payer: Self-pay | Admitting: Family

## 2016-12-11 NOTE — Telephone Encounter (Signed)
Spoke with Samuel Cantrell and give him the results of his [place] and chest xray yesterday. All questions were answered and we will plan to see him back again in November.

## 2016-12-21 ENCOUNTER — Other Ambulatory Visit: Payer: Self-pay | Admitting: Medical

## 2017-01-23 ENCOUNTER — Ambulatory Visit (HOSPITAL_BASED_OUTPATIENT_CLINIC_OR_DEPARTMENT_OTHER)
Admission: RE | Admit: 2017-01-23 | Discharge: 2017-01-23 | Disposition: A | Discharge status: Home / Self Care | Payer: BLUE CROSS/BLUE SHIELD | Source: Ambulatory Visit | Attending: Family | Admitting: Family

## 2017-01-23 DIAGNOSIS — F17200 Nicotine dependence, unspecified, uncomplicated: Secondary | ICD-10-CM | POA: Diagnosis present

## 2017-01-23 DIAGNOSIS — I82621 Acute embolism and thrombosis of deep veins of right upper extremity: Secondary | ICD-10-CM

## 2017-01-23 DIAGNOSIS — R945 Abnormal results of liver function studies: Secondary | ICD-10-CM | POA: Insufficient documentation

## 2017-01-23 DIAGNOSIS — R7989 Other specified abnormal findings of blood chemistry: Secondary | ICD-10-CM

## 2017-01-24 ENCOUNTER — Other Ambulatory Visit: Payer: Self-pay | Admitting: Medical

## 2017-01-26 NOTE — Telephone Encounter (Signed)
Pt also is requesting Carvedilol to be refilled also.  Pt says that he is completely out of medication.   Please advise.    [phone] [place] - [phone] [facility] [address]

## 2017-01-30 ENCOUNTER — Other Ambulatory Visit (HOSPITAL_BASED_OUTPATIENT_CLINIC_OR_DEPARTMENT_OTHER): Payer: BLUE CROSS/BLUE SHIELD

## 2017-01-30 ENCOUNTER — Ambulatory Visit (HOSPITAL_BASED_OUTPATIENT_CLINIC_OR_DEPARTMENT_OTHER): Payer: BLUE CROSS/BLUE SHIELD | Admitting: Family

## 2017-01-30 ENCOUNTER — Other Ambulatory Visit: Payer: Self-pay

## 2017-01-30 VITALS — BP 112/76 | HR 84 | Temp 98.0°F | Resp 20 | Wt 129.0 lb

## 2017-01-30 DIAGNOSIS — Z7901 Long term (current) use of anticoagulants: Secondary | ICD-10-CM | POA: Diagnosis not present

## 2017-01-30 DIAGNOSIS — D6862 Lupus anticoagulant syndrome: Secondary | ICD-10-CM

## 2017-01-30 DIAGNOSIS — D6859 Other primary thrombophilia: Secondary | ICD-10-CM

## 2017-01-30 DIAGNOSIS — I82621 Acute embolism and thrombosis of deep veins of right upper extremity: Secondary | ICD-10-CM

## 2017-01-30 DIAGNOSIS — R945 Abnormal results of liver function studies: Secondary | ICD-10-CM

## 2017-01-30 DIAGNOSIS — R7989 Other specified abnormal findings of blood chemistry: Secondary | ICD-10-CM

## 2017-01-30 DIAGNOSIS — F17200 Nicotine dependence, unspecified, uncomplicated: Secondary | ICD-10-CM

## 2017-01-30 DIAGNOSIS — R760 Raised antibody titer: Secondary | ICD-10-CM

## 2017-01-30 LAB — CBC WITH DIFFERENTIAL (CANCER CENTER ONLY)
BASO#: 0 10*3/uL (ref 0.0–0.2)
BASO%: 0.5 % (ref 0.0–2.0)
EOS ABS: 0.2 10*3/uL (ref 0.0–0.5)
EOS%: 2.6 % (ref 0.0–7.0)
HEMATOCRIT: 43 % (ref 38.7–49.9)
HEMOGLOBIN: 15.3 g/dL (ref 13.0–17.1)
LYMPH#: 3.6 10*3/uL — AB (ref 0.9–3.3)
LYMPH%: 54.8 % — ABNORMAL HIGH (ref 14.0–48.0)
MCH: 30.7 pg (ref 28.0–33.4)
MCHC: 35.6 g/dL (ref 32.0–35.9)
MCV: 86 fL (ref 82–98)
MONO#: 0.5 10*3/uL (ref 0.1–0.9)
MONO%: 7.6 % (ref 0.0–13.0)
NEUT%: 34.5 % — ABNORMAL LOW (ref 40.0–80.0)
NEUTROS ABS: 2.3 10*3/uL (ref 1.5–6.5)
Platelets: 169 10*3/uL (ref 145–400)
RBC: 4.98 10*6/uL (ref 4.20–5.70)
RDW: 14.4 % (ref 11.1–15.7)
WBC: 6.6 10*3/uL (ref 4.0–10.0)

## 2017-01-30 LAB — CMP (CANCER CENTER ONLY)
ALBUMIN: 3.6 g/dL (ref 3.3–5.5)
ALT(SGPT): 92 U/L — ABNORMAL HIGH (ref 10–47)
AST: 111 U/L — ABNORMAL HIGH (ref 11–38)
Alkaline Phosphatase: 162 U/L — ABNORMAL HIGH (ref 26–84)
BUN, Bld: 11 mg/dL (ref 7–22)
CHLORIDE: 105 meq/L (ref 98–108)
CO2: 28 meq/L (ref 18–33)
CREATININE: 1.1 mg/dL (ref 0.6–1.2)
Calcium: 8.7 mg/dL (ref 8.0–10.3)
Glucose, Bld: 97 mg/dL (ref 73–118)
POTASSIUM: 4.1 meq/L (ref 3.3–4.7)
SODIUM: 145 meq/L (ref 128–145)
TOTAL PROTEIN: 7.4 g/dL (ref 6.4–8.1)
Total Bilirubin: 0.6 mg/dl (ref 0.20–1.60)

## 2017-01-30 NOTE — Progress Notes (Signed)
Hematology and Oncology Follow Up Visit  Samuel Cantrell [id number] [birth year] 41 y.o. 01/30/2017   Principle Diagnosis:  Acute DVT of brachial vein of right upper extremity Protein C deficiency  Positive lupus anticoagulant  Current Therapy:   Xarelto 20 mg PO daily    Interim History:  Mr. Samuel Cantrell is here today for follow-up. He is doing well on Xarelto taking as prescribed and has no complaints at this time. No episodes of bleeding, bruising or petechiae.  His US last week showed no evidence of residual right upper extremity DVT or superficial thrombophlebitis.  No fever, chills, n/v, cough, rash, dizziness, SOB, chest pain, palpitations, abdominal pain or changes in bowel or bladder habits.  No swelling, tenderness, numbness or tingling in his extremities. No c/o pain at this time.  He has maintained a good appetite and is staying well hydrated. His weight is stable.   ECOG Performance Status: 0 - Asymptomatic  Medications:  Allergies as of 01/30/2017   No Known Allergies     Medication List        Accurate as of 01/30/17 11:44 AM. Always use your most recent med list.          amLODipine 2.5 MG tablet Commonly known as:  NORVASC TAKE 1 TABLET BY MOUTH DAILY   carvedilol 12.5 MG tablet Commonly known as:  COREG TAKE 1 TABLET(12.5 MG) BY MOUTH TWICE DAILY WITH A MEAL   diphenhydrAMINE 25 MG tablet Commonly known as:  BENADRYL Take 25 mg by mouth every 6 (six) hours as needed for itching or allergies.   fluticasone 50 MCG/ACT nasal spray Commonly known as:  FLONASE SHAKE LIQUID AND USE 2 SPRAYS IN EACH NOSTRIL DAILY   lisinopril 5 MG tablet Commonly known as:  PRINIVIL,ZESTRIL TAKE 1 TABLET(5 MG) BY MOUTH DAILY   potassium chloride 10 MEQ tablet Commonly known as:  K-DUR,KLOR-CON TAKE 1 TABLET BY MOUTH DAILY FOR 7 DAYS, THEN TAKE 1 TABLET EVERY OTHER DAY   rivaroxaban 20 MG Tabs tablet Commonly known as:  XARELTO Take 1 tablet (20 mg total) by  mouth daily with supper.       Allergies: No Known Allergies  Past Medical History, Surgical history, Social history, and Family History were reviewed and updated.  Review of Systems: All other 10 point review of systems is negative.   Physical Exam:  weight is 129 lb (58.5 kg). His oral temperature is 98 F (36.7 C). His blood pressure is 112/76 and his pulse is 84. His respiration is 20 and oxygen saturation is 100%.   Wt Readings from Last 3 Encounters:  01/30/17 129 lb (58.5 kg)  12/10/16 127 lb (57.6 kg)  11/25/16 127 lb (57.6 kg)    Ocular: Sclerae unicteric, pupils equal, round and reactive to light Ear-nose-throat: Oropharynx clear, dentition fair Lymphatic: No cervical, supraclavicular or axillary adenopathy Lungs no rales or rhonchi, good excursion bilaterally Heart regular rate and rhythm, no murmur appreciated Abd soft, nontender, positive bowel sounds, no liver or spleen tip palpated on exam, no fluid wave MSK no focal spinal tenderness, no joint edema Neuro: non-focal, well-oriented, appropriate affect Breasts: Deferred   Lab Results  Component Value Date   WBC 6.6 01/30/2017   HGB 15.3 01/30/2017   HCT 43.0 01/30/2017   MCV 86 01/30/2017   PLT 169 01/30/2017   No results found for: FERRITIN, IRON, TIBC, UIBC, IRONPCTSAT Lab Results  Component Value Date   RBC 4.98 01/30/2017   No results found for: KPAFRELGTCHN, LAMBDASER,  KAPLAMBRATIO No results found for: IGGSERUM, IGA, IGMSERUM No results found for: Marda StalkerOTALPROTELP, ALBUMINELP, A1GS, A2GS, BETS, BETA2SER, GAMS, MSPIKE, SPEI   Chemistry      Component Value Date/Time   NA 145 01/30/2017 0952   K 4.1 01/30/2017 0952   CL 105 01/30/2017 0952   CO2 28 01/30/2017 0952   BUN 11 01/30/2017 0952   CREATININE 1.1 01/30/2017 0952      Component Value Date/Time   CALCIUM 8.7 01/30/2017 0952   ALKPHOS 162 (H) 01/30/2017 0952   AST 111 (H) 01/30/2017 0952   ALT 92 (H) 01/30/2017 0952   BILITOT 0.60  01/30/2017 0952      Impression and Plan: Mr. Samuel Cantrell is a very pleasant 41 yo African American gentleman with protein C deficiency and history of right upper extremity brachial DVT. US last week showed resolution of thrombus. He is doing well on Xarelto and will continue his same regimen.  We will plan to see him back again in another 4 months for follow-up and lab work.  He promises to contact our office with any questions or concerns. We can certainly see him sooner if need be.   Verdie Mosher[place]INNAT[place],Roe Koffman M, NP 11/23/201811:44 AM

## 2017-02-01 LAB — LUPUS ANTICOAGULANT PANEL
DRVVT: 63.3 s — AB (ref 0.0–47.0)
PTT-LA: 38.4 s (ref 0.0–51.9)
dRVVT Confirm: 1.2 ratio (ref 0.8–1.2)
dRVVT Mix: 53.4 s — ABNORMAL HIGH (ref 0.0–47.0)

## 2017-02-23 ENCOUNTER — Other Ambulatory Visit: Payer: Self-pay | Admitting: Medical

## 2017-02-26 ENCOUNTER — Other Ambulatory Visit: Payer: Self-pay | Admitting: *Deleted

## 2017-02-26 ENCOUNTER — Other Ambulatory Visit: Payer: Self-pay | Admitting: Medical

## 2017-02-26 MED ORDER — RIVAROXABAN 20 MG PO TABS: 20.0000 mg | ORAL_TABLET | Freq: Every day | ORAL | 11 refills | Status: DC

## 2017-03-28 ENCOUNTER — Other Ambulatory Visit: Payer: Self-pay | Admitting: Medical

## 2017-03-30 ENCOUNTER — Other Ambulatory Visit: Payer: Self-pay | Admitting: Medical

## 2017-03-31 ENCOUNTER — Telehealth: Payer: Self-pay | Admitting: Medical

## 2017-03-31 NOTE — Telephone Encounter (Signed)
Last OV 04/28/16 where his BP medications were addressed with Dr. Alvira MondaySaguier.

## 2017-03-31 NOTE — Telephone Encounter (Signed)
Copied from CRM #[id number]. Topic: Quick Communication - Rx Refill/Question >> Mar 31, 2017  8:55 AM Stephannie LiSimmons, Caitlinn Klinker L, NT wrote: Medication: Carvedilol ,lisinopril Has the patient contacted their pharmacy? {yes  (Agent: If no, request that the patient contact the pharmacy for the refill.) Preferred Pharmacy (with phone number or street name): [facility] , [place] [phone] Agent: Please be advised that RX refills may take up to 3 business days. We ask that you follow-up with your pharmacy.

## 2017-04-28 ENCOUNTER — Other Ambulatory Visit: Payer: Self-pay | Admitting: Medical

## 2017-05-29 ENCOUNTER — Other Ambulatory Visit: Payer: Self-pay | Admitting: Medical

## 2017-05-29 ENCOUNTER — Inpatient Hospital Stay: Payer: Self-pay | Attending: Family | Admitting: Family

## 2017-05-29 ENCOUNTER — Inpatient Hospital Stay: Payer: Self-pay

## 2017-06-01 ENCOUNTER — Other Ambulatory Visit: Payer: Self-pay | Admitting: Medical

## 2017-06-01 NOTE — Telephone Encounter (Signed)
Copied from CRM [phone]#[id number]. Topic: Quick Communication - See Telephone Encounter >> Jun 01, 2017 10:31 AM Waymon AmatoBurton, Donna F wrote: CRM for notification. See Telephone encounter for: 06/01/17.pt is needing a refill on his lisinopril and he is completely out when [place] [facility] [place] they told him they do not have another refill left  Best number [phone]

## 2017-06-02 MED ORDER — LISINOPRIL 5 MG PO TABS: ORAL_TABLET | ORAL | 0 refills | Status: DC

## 2017-06-02 NOTE — Telephone Encounter (Signed)
Patient is calling to follow up on request for a refill ,he has called the pharmacy

## 2017-06-04 ENCOUNTER — Ambulatory Visit (INDEPENDENT_AMBULATORY_CARE_PROVIDER_SITE_OTHER): Payer: Commercial Managed Care - PPO | Admitting: Medical

## 2017-06-04 ENCOUNTER — Ambulatory Visit: Payer: Self-pay | Admitting: *Deleted

## 2017-06-04 ENCOUNTER — Encounter: Payer: Self-pay | Admitting: Medical

## 2017-06-04 VITALS — BP 158/98 | HR 83 | Resp 16 | Ht 68.0 in | Wt 131.0 lb

## 2017-06-04 DIAGNOSIS — R319 Hematuria, unspecified: Secondary | ICD-10-CM

## 2017-06-04 DIAGNOSIS — I10 Essential (primary) hypertension: Secondary | ICD-10-CM

## 2017-06-04 LAB — POC URINALSYSI DIPSTICK (AUTOMATED)
BILIRUBIN UA: NEGATIVE
GLUCOSE UA: NEGATIVE
NITRITE UA: POSITIVE
Spec Grav, UA: 1.025 (ref 1.010–1.025)
Urobilinogen, UA: 1 E.U./dL
pH, UA: 6 (ref 5.0–8.0)

## 2017-06-04 MED ORDER — APIXABAN 5 MG PO TABS: 5.0000 mg | ORAL_TABLET | Freq: Two times a day (BID) | ORAL | 0 refills | Status: DC

## 2017-06-04 MED FILL — ELIQUIS 5 MG TABLET: 5 | 30 days supply | Qty: 60 | Fill #0

## 2017-06-04 NOTE — Telephone Encounter (Signed)
Pt called with having "pink and red in his urine" first thing every morning since last Wednesday. He is on Xarelto. He denies pain, fever, abd pain or any problems urinating. He states he has a feeling in his lower back in the morning before he urinates then it goes away after urinating. He is drinking lots of water and staying hydrated. No hx of kidney stones.  Appointment made for today with his pcp according to protocol.   Reason for Disposition . Taking Coumadin (warfarin) or other strong blood thinner, or known bleeding disorder (e.g., thrombocytopenia)  Answer Assessment - Initial Assessment Questions 1. COLOR of URINE: "Describe the color of the urine."  (e.g., tea-colored, pink, red, blood clots, bloody)     Pink and red 2. ONSET: "When did the bleeding start?"      Middle of last week 3. EPISODES: "How many times has there been blood in the urine?" or "How many times today?"     Each morning and it lasts until the middle of the day 4. PAIN with URINATION: "Is there any pain with passing your urine?" If so, ask: "How bad is the pain?"  (Scale 1-10; or mild, moderate, severe)    - MILD - complains slightly about urination hurting    - MODERATE - interferes with normal activities      - SEVERE - excruciating, unwilling or unable to urinate because of the pain      No pain 5. FEVER: "Do you have a fever?" If so, ask: "What is your temperature, how was it measured, and when did it start?"     no 6. ASSOCIATED SYMPTOMS: "Are you passing urine more frequently than usual?"     no 7. OTHER SYMPTOMS: "Do you have any other symptoms?" (e.g., back/flank pain, abdominal pain, vomiting)     Only have the feeling of needing to go to the bathroom in his lower back and then goes away after he urinates. 8. PREGNANCY: "Is there any chance you are pregnant?" "When was your last menstrual period?"     n/a  Protocols used: URINE - BLOOD IN-A-AH

## 2017-06-04 NOTE — Progress Notes (Signed)
Subjective:    Patient ID: Samuel Cantrell, male    DOB: Sep 06, 1975, 42 y.o.   MRN: [id number]  HPI  Pt states each day since past Wednesday he had appearance of burgundy appearing color. Not reporting bright red color and not reporting clots of blood in urine. Toward the early afternoon the urine looks clear. No pain on urination. No back pain. No perineum pain. No fever, no chills or sweats.Pt does have history of smoking.  No hx of gross hematuria.  Pt bp states his bp 130-140/80 except today was higher. He states recenlty nervous about the above. He has has been on line reading about potential conditionss.    Review of Systems  Constitutional: Negative for chills, fatigue and fever.  HENT: Negative for congestion.   Respiratory: Negative for cough, chest tightness, shortness of breath and wheezing.   Cardiovascular: Negative for chest pain and palpitations.  Gastrointestinal: Negative for abdominal pain, diarrhea, nausea and vomiting.  Genitourinary: Positive for hematuria. Negative for difficulty urinating, dysuria, flank pain, penile pain, testicular pain and urgency.  Musculoskeletal: Negative for back pain.  Skin: Negative for rash.  Neurological: Negative for dizziness, facial asymmetry and light-headedness.  Hematological: Negative for adenopathy. Does not bruise/bleed easily.  Psychiatric/Behavioral: Negative for behavioral problems and confusion. The patient is not nervous/anxious.     Past Medical History:  Diagnosis Date  . Allergy   . GERD (gastroesophageal reflux disease)   . Hypertensive urgency 02/28/2016     Social History   Socioeconomic History  . Marital status: Married    Spouse name: Not on file  . Number of children: Not on file  . Years of education: Not on file  . Highest education level: Not on file  Occupational History  . Occupation: AstronomerUtility Locator  Social Needs  . Financial resource strain: Not on file  . Food insecurity:   Worry: Not on file    Inability: Not on file  . Transportation needs:    Medical: Not on file    Non-medical: Not on file  Tobacco Use  . Smoking status: Current Every Day Smoker    Packs/day: 0.50    Years: 8.00    Pack years: 4.00    Types: Cigarettes  . Smokeless tobacco: Never Used  Substance and Sexual Activity  . Alcohol use: Yes    Comment: every other night 2 beers  . Drug use: No  . Sexual activity: Yes  Lifestyle  . Physical activity:    Days per week: Not on file    Minutes per session: Not on file  . Stress: Not on file  Relationships  . Social connections:    Talks on phone: Not on file    Gets together: Not on file    Attends religious service: Not on file    Active member of club or organization: Not on file    Attends meetings of clubs or organizations: Not on file    Relationship status: Not on file  . Intimate partner violence:    Fear of current or ex partner: Not on file    Emotionally abused: Not on file    Physically abused: Not on file    Forced sexual activity: Not on file  Other Topics Concern  . Not on file  Social History Narrative   Pt lives with wife.    Past Surgical History:  Procedure Laterality Date  . NO PAST SURGERIES      Family History  Problem Relation  Age of Onset  . Hypertension Mother     No Known Allergies  Current Outpatient Medications on File Prior to Visit  Medication Sig Dispense Refill  . amLODipine (NORVASC) 2.5 MG tablet TAKE 1 TABLET BY MOUTH DAILY 30 tablet 0  . carvedilol (COREG) 12.5 MG tablet TAKE 1 TABLET(12.5 MG) BY MOUTH TWICE DAILY WITH A MEAL 180 tablet 0  . diphenhydrAMINE (BENADRYL) 25 MG tablet Take 25 mg by mouth every 6 (six) hours as needed for itching or allergies.    . fluticasone (FLONASE) 50 MCG/ACT nasal spray SHAKE LIQUID AND USE 2 SPRAYS IN EACH NOSTRIL DAILY 16 g 0  . lisinopril (PRINIVIL,ZESTRIL) 5 MG tablet TAKE 1 TABLET(5 MG) BY MOUTH DAILY 30 tablet 0  . potassium chloride  (K-DUR,KLOR-CON) 10 MEQ tablet TAKE 1 TABLET BY MOUTH DAILY FOR 7 DAYS, THEN TAKE 1 TABLET EVERY OTHER DAY 30 tablet 0  . rivaroxaban (XARELTO) 20 MG TABS tablet Take 1 tablet (20 mg total) by mouth daily with supper. 30 tablet 11   No current facility-administered medications on file prior to visit.     BP (!) 158/98 (BP Location: Right Arm, Patient Position: Sitting, Cuff Size: Normal)   Pulse 83   Resp 16   Ht 5\' 8"  (1.727 m)   Wt 131 lb (59.4 kg)   SpO2 97%   BMI 19.92 kg/m       Objective:   Physical Exam  General- No acute distress. Pleasant patient. Neck- Full range of motion, no jvd Lungs- Clear, even and unlabored. Heart- regular rate and rhythm. Neurologic- CNII- XII grossly intact.  Back- no cva tenderness. Abdomen- soft, non tender, non distended, +bs. No rebound guarding.  Neuro- CN III-XII grossly intact.      Assessment & Plan:  For your recent blood in urine, we are getting a urine culture, PSA and CBC.  Also since you do have history of smoking, I am going to go ahead and refer you to urologist.  I want to stop the Xarelto and you can start Eliquis 5 mg twice daily tomorrow morning.  This is a process that Dr. Myna Hidalgo use with other patient under similar circumstances.  I will update him on your recent presentation and my plan going forward.  I do want you to follow-up in 1 week and I want to repeat your urine for microscopic blood even if you do not see any gross blood in your urine.  Your blood pressure is high today and I do think it may be related to recent stress.  Your other at home readings appear at a good level/acceptable.  Follow-up in 1 week or as needed.  Esperanza Richters, PA-C

## 2017-06-04 NOTE — Telephone Encounter (Signed)
FYI to PCP

## 2017-06-04 NOTE — Patient Instructions (Addendum)
For your recent blood in urine, we are getting a urine culture, PSA and CBC.  Also since you do have history of smoking, I am going to go ahead and refer you to urologist.  I want to stop the Xarelto and you can start Eliquis 5 mg twice daily tomorrow morning.  This is a process that Dr. Myna HidalgoEnnever use with other patient under similar circumstances.  I will update him on your recent presentation and my plan going forward.  I do want you to follow-up in 1 week and I want to repeat your urine for microscopic blood even if you do not see any gross blood in your urine.  Your blood pressure is high today and I do think it may be related to recent stress.  Your other at home readings appear at a good level/acceptable.  Continue current blood pressure regimen.  Keep checking your blood pressure at home and please notify me if your BP readings are above 140/90.  Follow-up in 1 week or as needed.

## 2017-06-05 ENCOUNTER — Telehealth: Payer: Self-pay | Admitting: Family

## 2017-06-05 ENCOUNTER — Telehealth: Payer: Self-pay

## 2017-06-05 LAB — CBC WITH DIFFERENTIAL/PLATELET
BASOS ABS: 0 10*3/uL (ref 0.0–0.1)
Basophils Relative: 0.7 % (ref 0.0–3.0)
EOS ABS: 0.2 10*3/uL (ref 0.0–0.7)
Eosinophils Relative: 3.5 % (ref 0.0–5.0)
HEMATOCRIT: 42 % (ref 39.0–52.0)
HEMOGLOBIN: 14.4 g/dL (ref 13.0–17.0)
LYMPHS PCT: 50.6 % — AB (ref 12.0–46.0)
Lymphs Abs: 2.5 10*3/uL (ref 0.7–4.0)
MCHC: 34.2 g/dL (ref 30.0–36.0)
MCV: 90.5 fl (ref 78.0–100.0)
Monocytes Absolute: 0.6 10*3/uL (ref 0.1–1.0)
Monocytes Relative: 11.9 % (ref 3.0–12.0)
Neutro Abs: 1.6 10*3/uL (ref 1.4–7.7)
Neutrophils Relative %: 33.3 % — ABNORMAL LOW (ref 43.0–77.0)
Platelets: 115 10*3/uL — ABNORMAL LOW (ref 150.0–400.0)
RBC: 4.64 Mil/uL (ref 4.22–5.81)
RDW: 14.6 % (ref 11.5–15.5)
WBC: 4.9 10*3/uL (ref 4.0–10.5)

## 2017-06-05 LAB — COMPREHENSIVE METABOLIC PANEL
ALK PHOS: 114 U/L (ref 39–117)
ALT: 106 U/L — AB (ref 0–53)
AST: 152 U/L — AB (ref 0–37)
Albumin: 4.3 g/dL (ref 3.5–5.2)
BUN: 6 mg/dL (ref 6–23)
CO2: 28 mEq/L (ref 19–32)
CREATININE: 0.66 mg/dL (ref 0.40–1.50)
Calcium: 9.2 mg/dL (ref 8.4–10.5)
Chloride: 100 mEq/L (ref 96–112)
GFR: 168.54 mL/min (ref >60.00)
Glucose, Bld: 99 mg/dL (ref 70–99)
Potassium: 3.3 mEq/L — ABNORMAL LOW (ref 3.5–5.1)
SODIUM: 140 meq/L (ref 135–145)
TOTAL PROTEIN: 7.5 g/dL (ref 6.0–8.3)
Total Bilirubin: 0.5 mg/dL (ref 0.2–1.2)

## 2017-06-05 LAB — PSA: PSA: 0.75 ng/mL (ref 0.10–4.00)

## 2017-06-05 NOTE — Telephone Encounter (Signed)
The patient called and is concerned about the cost of the anticoagulants and was wondering if he could go to full dose aspirin. I spoke with Dr. Myna HidalgoEnnever and he is ok with this. Mr. Samuel Cantrell will finish his Eliquis this month and transition to Aspirin 325 mg PO daily. We will see him back in another 3 months for follow-up.

## 2017-06-05 NOTE — Telephone Encounter (Signed)
PA initiated via Covermymeds; KEY: [id number]. PA approved.  [id number];XBJYNW:GNFAOZHYCaseId:[id number];Status:Approved;Review Type:Prior Auth;Coverage Start Date:05/06/2017;Coverage End Date:06/05/2018;

## 2017-06-06 LAB — URINE CULTURE
MICRO NUMBER:: 90388269
RESULT: NO GROWTH
SPECIMEN QUALITY:: ADEQUATE

## 2017-07-01 ENCOUNTER — Other Ambulatory Visit: Payer: Self-pay | Admitting: Medical

## 2017-07-02 NOTE — Telephone Encounter (Signed)
Patient states he only has 1 pill left of the Lisinopril and he needs both of these medications refilled. patient would like to know the status of these rx getting called in.

## 2017-08-29 ENCOUNTER — Other Ambulatory Visit: Payer: Self-pay | Admitting: Medical

## 2017-09-01 ENCOUNTER — Inpatient Hospital Stay: Payer: Self-pay | Attending: Family | Admitting: Family

## 2017-09-01 ENCOUNTER — Inpatient Hospital Stay: Payer: Self-pay

## 2017-09-01 VITALS — BP 162/100 | HR 86 | Temp 98.8°F | Resp 18 | Wt 125.8 lb

## 2017-09-01 DIAGNOSIS — Z7982 Long term (current) use of aspirin: Secondary | ICD-10-CM | POA: Insufficient documentation

## 2017-09-01 DIAGNOSIS — I82621 Acute embolism and thrombosis of deep veins of right upper extremity: Secondary | ICD-10-CM

## 2017-09-01 DIAGNOSIS — D6859 Other primary thrombophilia: Secondary | ICD-10-CM | POA: Insufficient documentation

## 2017-09-01 DIAGNOSIS — R760 Raised antibody titer: Secondary | ICD-10-CM | POA: Insufficient documentation

## 2017-09-01 LAB — CMP (CANCER CENTER ONLY)
ALK PHOS: 106 U/L — AB (ref 26–84)
ALT: 51 U/L — ABNORMAL HIGH (ref 10–47)
ANION GAP: 8 (ref 5–15)
AST: 109 U/L — ABNORMAL HIGH (ref 11–38)
Albumin: 4.1 g/dL (ref 3.5–5.0)
BILIRUBIN TOTAL: 1 mg/dL (ref 0.2–1.6)
BUN: 8 mg/dL (ref 7–22)
CALCIUM: 8.9 mg/dL (ref 8.0–10.3)
CHLORIDE: 101 mmol/L (ref 98–108)
CO2: 29 mmol/L (ref 18–33)
CREATININE: 0.7 mg/dL (ref 0.60–1.20)
Glucose, Bld: 94 mg/dL (ref 73–118)
Potassium: 3.6 mmol/L (ref 3.3–4.7)
Sodium: 138 mmol/L (ref 128–145)
Total Protein: 7.5 g/dL (ref 6.4–8.1)

## 2017-09-01 LAB — CBC WITH DIFFERENTIAL (CANCER CENTER ONLY)
Basophils Absolute: 0.1 10*3/uL (ref 0.0–0.1)
Basophils Relative: 1 %
Eosinophils Absolute: 0.2 10*3/uL (ref 0.0–0.5)
Eosinophils Relative: 4 %
HEMATOCRIT: 40.9 % (ref 38.7–49.9)
HEMOGLOBIN: 14.6 g/dL (ref 13.0–17.1)
LYMPHS ABS: 2.3 10*3/uL (ref 0.9–3.3)
LYMPHS PCT: 46 %
MCH: 30.4 pg (ref 28.0–33.4)
MCHC: 35.7 g/dL (ref 32.0–35.9)
MCV: 85.2 fL (ref 82.0–98.0)
MONO ABS: 0.6 10*3/uL (ref 0.1–0.9)
MONOS PCT: 12 %
NEUTROS PCT: 37 %
Neutro Abs: 1.8 10*3/uL (ref 1.5–6.5)
Platelet Count: 156 10*3/uL (ref 145–400)
RBC: 4.8 MIL/uL (ref 4.20–5.70)
RDW: 15.9 % — ABNORMAL HIGH (ref 11.1–15.7)
WBC Count: 5 10*3/uL (ref 4.0–10.0)

## 2017-09-01 NOTE — Progress Notes (Signed)
Hematology and Oncology Follow Up Visit  Samuel Cantrell [id number] 1975/08/19 42 y.o. 09/01/2017   Principle Diagnosis:  Acute DVT of brachial vein of right upper extremity Protein C deficiency  Positive lupus anticoagulant  Current Therapy:   Aspirin 325 mg PO daily   Interim History: Samuel Cantrell is here today for follow-up. He is doing well on full dose aspirin and has no complaints at this time.  He has had no swelling, tenderness, numbness or tingling in his extremities.  No issues with bruising or bleeding.  No issue with infection. No fever, chills, n/v, cough, rash, dizziness, SOB, chest pain, palpitations, abdominal pain or changes in bowel or bladder habits.  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 He is active and does a lot of walking at his job. Weight is stable.   ECOG Performance Status: 0 - Asymptomatic  Medications:  Allergies as of 09/01/2017   No Known Allergies     Medication List        Accurate as of 09/01/17 10:56 AM. Always use your most recent med list.          amLODipine 2.5 MG tablet Commonly known as:  NORVASC TAKE 1 TABLET BY MOUTH DAILY   apixaban 5 MG Tabs tablet Commonly known as:  ELIQUIS Take 1 tablet (5 mg total) by mouth 2 (two) times daily.   carvedilol 12.5 MG tablet Commonly known as:  COREG TAKE 1 TABLET(12.5 MG) BY MOUTH TWICE DAILY WITH A MEAL   diphenhydrAMINE 25 MG tablet Commonly known as:  BENADRYL Take 25 mg by mouth every 6 (six) hours as needed for itching or allergies.   fluticasone 50 MCG/ACT nasal spray Commonly known as:  FLONASE SHAKE LIQUID AND USE 2 SPRAYS IN EACH NOSTRIL DAILY   lisinopril 5 MG tablet Commonly known as:  PRINIVIL,ZESTRIL TAKE 1 TABLET(5 MG) BY MOUTH DAILY   potassium chloride 10 MEQ tablet Commonly known as:  K-DUR,KLOR-CON TAKE 1 TABLET BY MOUTH DAILY FOR 7 DAYS, THEN TAKE 1 TABLET EVERY OTHER DAY   rivaroxaban 20 MG Tabs  tablet Commonly known as:  XARELTO Take 1 tablet (20 mg total) by mouth daily with supper.       Allergies: No Known Allergies  Past Medical History, Surgical history, Social history, and Family History were reviewed and updated.  Review of Systems: All other 10 point review of systems is negative.   Physical Exam:  vitals were not taken for this visit.   Wt Readings from Last 3 Encounters:  06/04/17 131 lb (59.4 kg)  01/30/17 129 lb (58.5 kg)  12/10/16 127 lb (57.6 kg)    Ocular: Sclerae unicteric, pupils equal, round and reactive to light Ear-nose-throat: Oropharynx clear, dentition fair Lymphatic: No cervical, supraclavicular or axillary adenopathy Lungs no rales or rhonchi, good excursion bilaterally Heart regular rate and rhythm, no murmur appreciated Abd soft, nontender, positive bowel sounds, no liver or spleen tip palpated on exam, no fluid wave  MSK no focal spinal tenderness, no joint edema Neuro: non-focal, well-oriented, appropriate affect Breasts: Deferred   Lab Results  Component Value Date   WBC 5.0 09/01/2017   HGB 14.6 09/01/2017   HCT 40.9 09/01/2017   MCV 85.2 09/01/2017   PLT 156 09/01/2017   No results found for: FERRITIN, IRON, TIBC, UIBC, IRONPCTSAT Lab Results  Component Value Date   RBC 4.80 09/01/2017   No results found for: KPAFRELGTCHN, LAMBDASER, KAPLAMBRATIO No results found for: IGGSERUM,  IGA, IGMSERUM No results found for: Marda Stalker, SPEI   Chemistry      Component Value Date/Time   NA 140 06/04/2017 1621   NA 145 01/30/2017 0952   K 3.3 (L) 06/04/2017 1621   K 4.1 01/30/2017 0952   CL 100 06/04/2017 1621   CL 105 01/30/2017 0952   CO2 28 06/04/2017 1621   CO2 28 01/30/2017 0952   BUN 6 06/04/2017 1621   BUN 11 01/30/2017 0952   CREATININE 0.66 06/04/2017 1621   CREATININE 1.1 01/30/2017 0952      Component Value Date/Time   CALCIUM 9.2 06/04/2017 1621   CALCIUM  8.7 01/30/2017 0952   ALKPHOS 114 06/04/2017 1621   ALKPHOS 162 (H) 01/30/2017 0952   AST 152 (H) 06/04/2017 1621   AST 111 (H) 01/30/2017 0952   ALT 106 (H) 06/04/2017 1621   ALT 92 (H) 01/30/2017 0952   BILITOT 0.5 06/04/2017 1621   BILITOT 0.60 01/30/2017 0952      Impression and Plan: Samuel Cantrell is a very pleasant 42 yo African American gentleman with protein C deficiency and positive lupus anticoagulant. He has history of a right upper extremity brachial DVT which has since resolved.  Financially, he was unable to afford Xarelto or Eliquis and was changed to full dose aspirin daily. He is doing well and so far there has been no evidence of recurrence.  He will continue this same regimen and we will plan to see him back in another 6 months for follow-up.  He will contact our office with any questions or concerns. We can certainly see him sooner if need be.   Samuel Gins, NP 6/25/201910:56 AM

## 2017-09-03 LAB — LUPUS ANTICOAGULANT PANEL
DRVVT: 39 s (ref 0.0–47.0)
PTT LA: 36.8 s (ref 0.0–51.9)

## 2017-09-24 ENCOUNTER — Other Ambulatory Visit: Payer: Self-pay | Admitting: Medical

## 2017-09-24 NOTE — Telephone Encounter (Signed)
Called pt and informed him the below and pt stated will see his work schedule since recently had missed out on work for eye doctor appt and has to see when he can take another day off. Pt will call to schedule fu appt.

## 2017-09-24 NOTE — Telephone Encounter (Signed)
Pt is due for follow up please call and schedule appointment.  

## 2017-10-05 DIAGNOSIS — H01009 Unspecified blepharitis unspecified eye, unspecified eyelid: Secondary | ICD-10-CM | POA: Diagnosis not present

## 2017-10-05 DIAGNOSIS — H53452 Other localized visual field defect, left eye: Secondary | ICD-10-CM | POA: Diagnosis not present

## 2017-10-05 DIAGNOSIS — H40013 Open angle with borderline findings, low risk, bilateral: Secondary | ICD-10-CM | POA: Diagnosis not present

## 2017-10-24 ENCOUNTER — Other Ambulatory Visit: Payer: Self-pay | Admitting: Medical

## 2017-12-23 ENCOUNTER — Other Ambulatory Visit: Payer: Self-pay | Admitting: Medical

## 2017-12-29 DIAGNOSIS — H53452 Other localized visual field defect, left eye: Secondary | ICD-10-CM | POA: Diagnosis not present

## 2018-02-26 ENCOUNTER — Inpatient Hospital Stay: Payer: 59 | Admitting: Family

## 2018-02-26 ENCOUNTER — Inpatient Hospital Stay: Payer: 59

## 2018-03-19 ENCOUNTER — Ambulatory Visit: Payer: 59 | Admitting: Family

## 2018-03-19 ENCOUNTER — Other Ambulatory Visit: Payer: 59

## 2018-03-23 ENCOUNTER — Other Ambulatory Visit: Payer: Self-pay | Admitting: Medical

## 2018-03-23 NOTE — Telephone Encounter (Signed)
Pt is due for follow up please call and schedule appointment.  

## 2018-03-23 NOTE — Telephone Encounter (Signed)
Called pt and schedule pt appt on 03-30-2018 as a fu appt. Done

## 2018-03-25 ENCOUNTER — Telehealth: Payer: Self-pay

## 2018-03-25 ENCOUNTER — Telehealth: Payer: Self-pay | Admitting: Medical

## 2018-03-25 MED ORDER — AMLODIPINE BESYLATE 2.5 MG PO TABS: 2.5000 mg | ORAL_TABLET | Freq: Every day | ORAL | 0 refills | Status: DC

## 2018-03-25 MED ORDER — LISINOPRIL 5 MG PO TABS: ORAL_TABLET | ORAL | 0 refills | Status: DC

## 2018-03-25 NOTE — Telephone Encounter (Signed)
Rx'd both his bp meds sent to pharmacy. Ask him to at least come in for nurse bp check within a month to make sure bp controlled. Also could do lipid panel and cmp. Recommend he come in fasting that day. Fasting for 8 hours prior to labs.

## 2018-03-25 NOTE — Telephone Encounter (Signed)
Copied from CRM [id number]. Topic: General - Other >> Mar 25, 2018  1:05 PM Marylen Ponto wrote: Reason for CRM: Pt stated he is unemployed at the moment and unable to pay out of pocket for the visit. Pt stated he still needs his blood pressure medication. Pt requests call back.

## 2018-03-30 ENCOUNTER — Ambulatory Visit: Payer: Self-pay | Admitting: Medical

## 2018-04-22 ENCOUNTER — Other Ambulatory Visit: Payer: Self-pay | Admitting: Medical

## 2018-05-23 IMAGING — DX DG CHEST 2V
2 series · 2 of 2 positions shown · non-contrast
Comparison: 02/28/2016

CLINICAL DATA: Smoker. Recently diagnosed right upper extremity
DVT.

EXAM:
CHEST  2 VIEW

[chest pa]
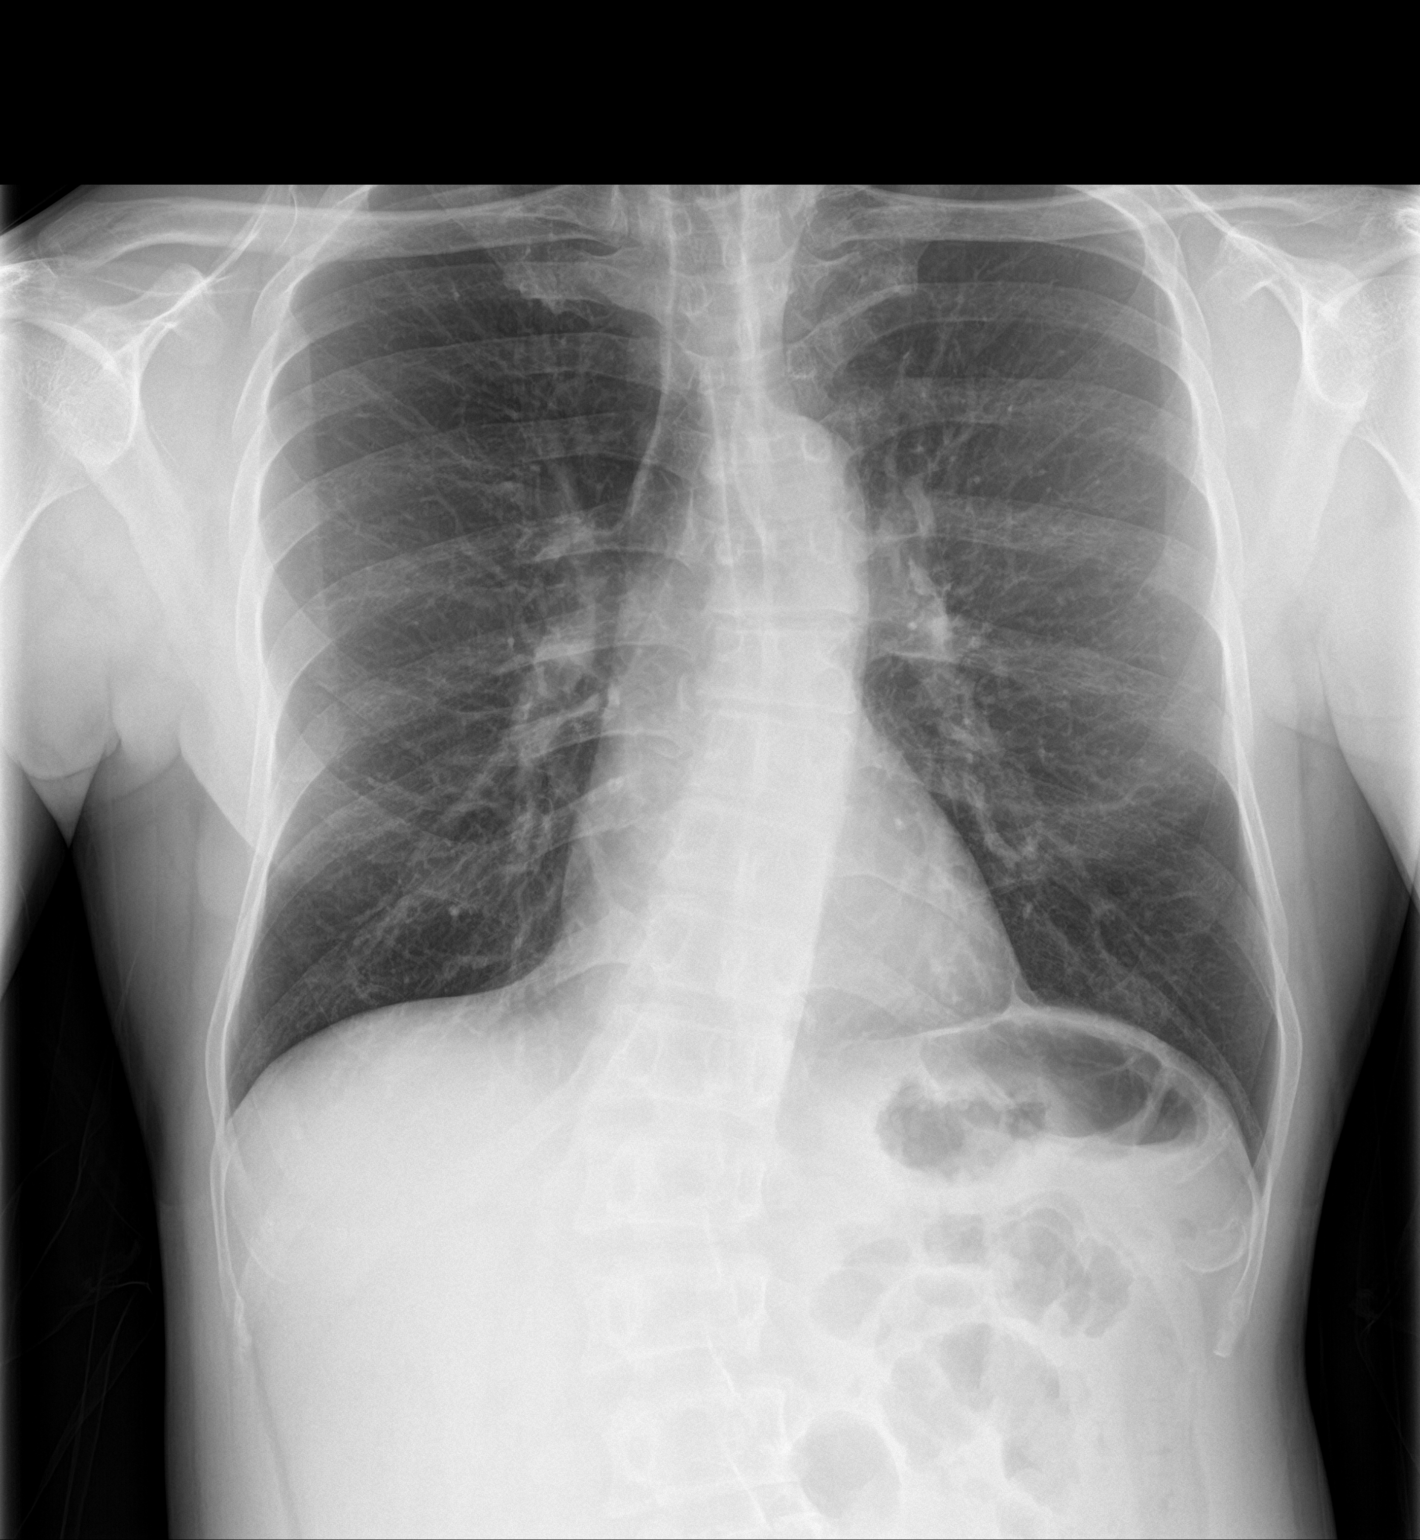

[chest lat]
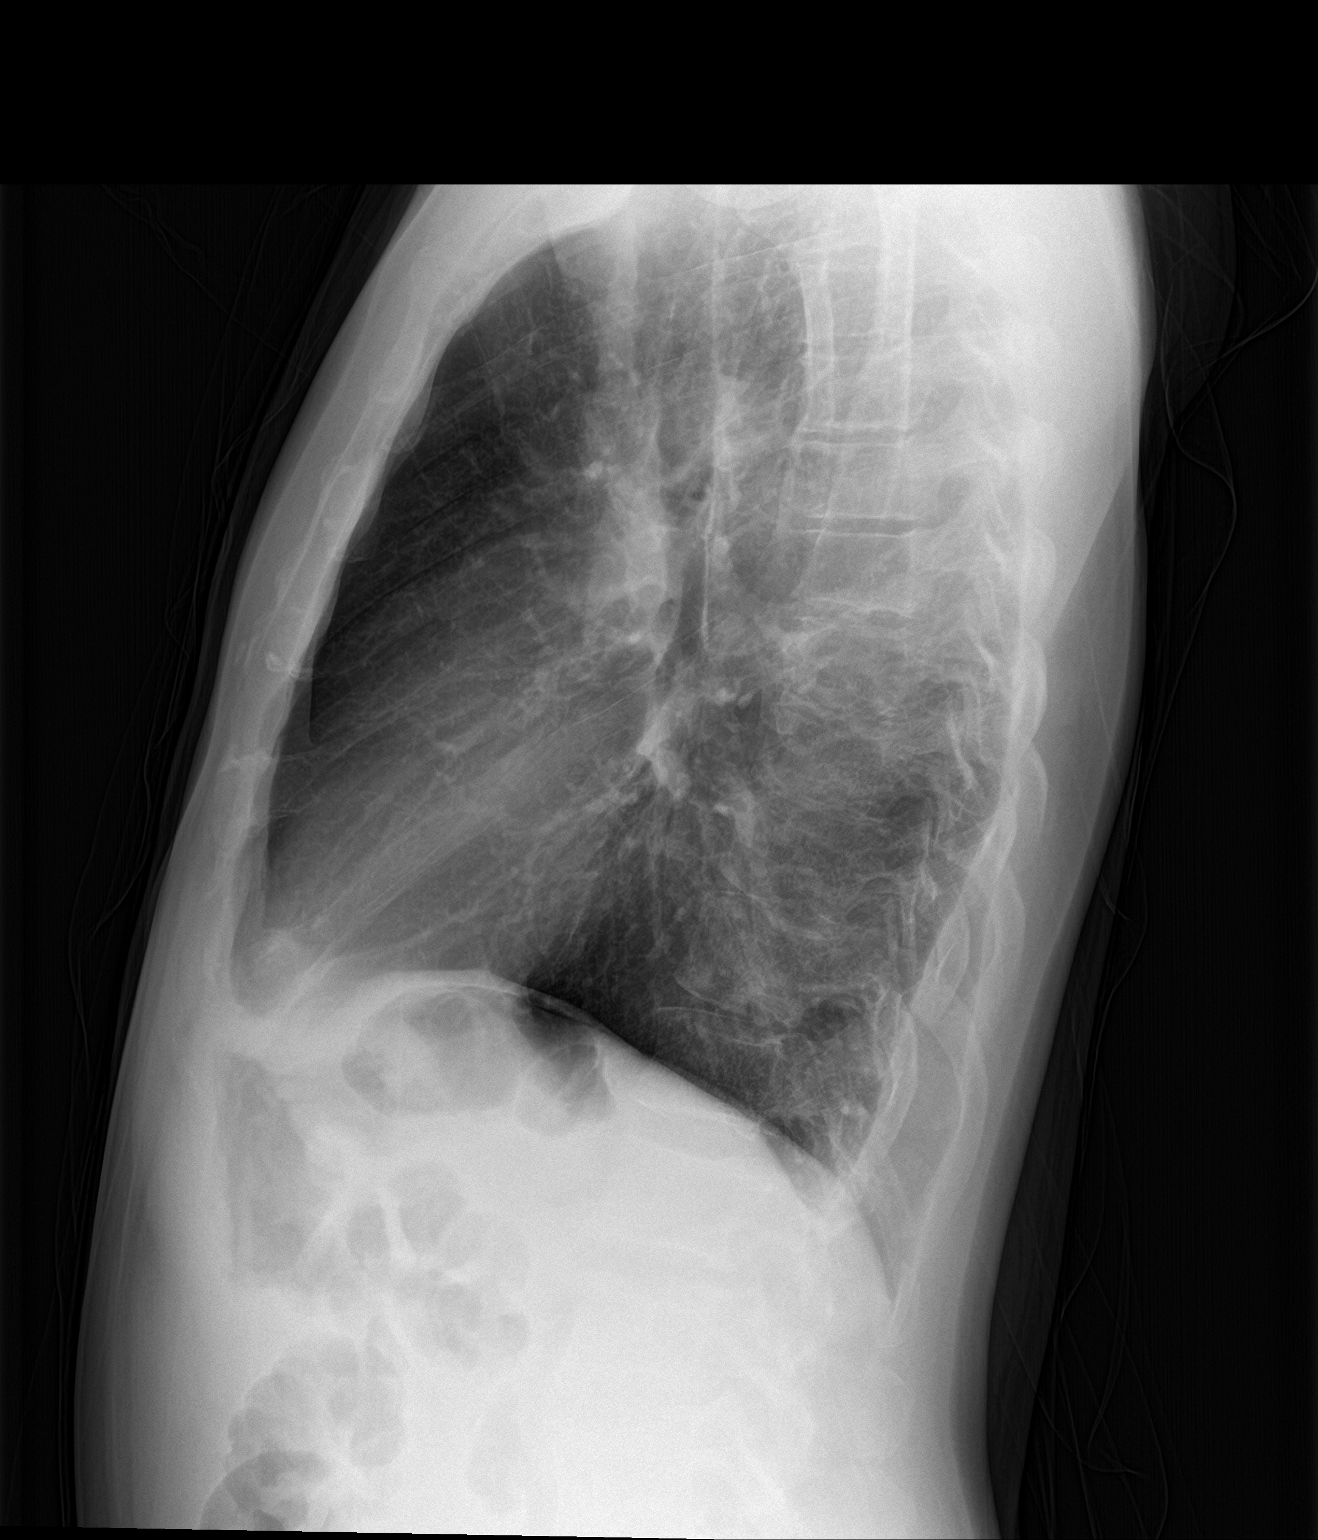

[2 of 2 positions shown; findings below may reference images not displayed]

FINDINGS: The cardiomediastinal silhouette is within normal limits. The lungs
are well inflated and clear. No pleural effusion or pneumothorax is
identified. Moderate S-shaped thoracolumbar scoliosis is noted.
IMPRESSION: No active cardiopulmonary disease.

## 2018-06-03 ENCOUNTER — Other Ambulatory Visit: Payer: Self-pay | Admitting: Medical

## 2018-07-05 ENCOUNTER — Other Ambulatory Visit: Payer: Self-pay | Admitting: Medical

## 2018-07-05 NOTE — Telephone Encounter (Signed)
Pt has not been seen in a year. Pt states he is still out of work and does not have insurance. Pt requesting refill on carvedilol and lisinopril.  Please advise.

## 2018-07-05 NOTE — Telephone Encounter (Signed)
I understand but he still has not been seen in a year. Also at times he also had varying levels of bp. So he really does need virtual visit. But before virtual visit he needs to check blood pressure and pulse on day of visit. Get commitment from him. I need bp level in chart and would recommend that we get him scheduled for labs in summer or sooner.  Please get him scheduled within next 3 days. Give me update what he says.

## 2018-07-09 ENCOUNTER — Other Ambulatory Visit: Payer: Self-pay

## 2018-07-09 MED ORDER — LISINOPRIL 5 MG PO TABS: ORAL_TABLET | ORAL | 0 refills | Status: DC

## 2018-07-09 MED ORDER — CARVEDILOL 12.5 MG PO TABS: ORAL_TABLET | ORAL | 0 refills | Status: DC

## 2018-07-12 ENCOUNTER — Encounter: Payer: Self-pay | Admitting: Medical

## 2018-07-12 ENCOUNTER — Other Ambulatory Visit: Payer: Self-pay

## 2018-07-12 ENCOUNTER — Ambulatory Visit (INDEPENDENT_AMBULATORY_CARE_PROVIDER_SITE_OTHER): Payer: Self-pay | Admitting: Medical

## 2018-07-12 VITALS — BP 136/98 | HR 89

## 2018-07-12 DIAGNOSIS — I10 Essential (primary) hypertension: Secondary | ICD-10-CM

## 2018-07-12 DIAGNOSIS — M5441 Lumbago with sciatica, right side: Secondary | ICD-10-CM

## 2018-07-12 MED ORDER — GABAPENTIN 100 MG PO CAPS: 100.0000 mg | ORAL_CAPSULE | Freq: Every day | ORAL | 0 refills | Status: DC

## 2018-07-12 MED ORDER — CYCLOBENZAPRINE HCL 5 MG PO TABS: 5.0000 mg | ORAL_TABLET | Freq: Every day | ORAL | 0 refills | Status: DC

## 2018-07-12 MED ORDER — CARVEDILOL 12.5 MG PO TABS: ORAL_TABLET | ORAL | 0 refills | Status: DC

## 2018-07-12 MED ORDER — LISINOPRIL 5 MG PO TABS: ORAL_TABLET | ORAL | 11 refills | Status: DC

## 2018-07-12 NOTE — Patient Instructions (Addendum)
Your blood pressure is relatively well controlled your diastolic bp is usually. Refilled bp med. Get cmp today.   For back pain. Recommend tylenol, low dose flexeril and gabapentin. Will get xray today.   Follow up in 3 weeks or as needed   Back Exercises If you have pain in your back, do these exercises 2-3 times each day or as told by your doctor. When the pain goes away, do the exercises once each day, but repeat the steps more times for each exercise (do more repetitions). If you do not have pain in your back, do these exercises once each day or as told by your doctor. Exercises Single Knee to Chest Do these steps 3-5 times in a row for each leg: 1. Lie on your back on a firm bed or the floor with your legs stretched out. 2. Bring one knee to your chest. 3. Hold your knee to your chest by grabbing your knee or thigh. 4. Pull on your knee until you feel a gentle stretch in your lower back. 5. Keep doing the stretch for 10-30 seconds. 6. Slowly let go of your leg and straighten it. Pelvic Tilt Do these steps 5-10 times in a row: 1. Lie on your back on a firm bed or the floor with your legs stretched out. 2. Bend your knees so they point up to the ceiling. Your feet should be flat on the floor. 3. Tighten your lower belly (abdomen) muscles to press your lower back against the floor. This will make your tailbone point up to the ceiling instead of pointing down to your feet or the floor. 4. Stay in this position for 5-10 seconds while you gently tighten your muscles and breathe evenly. Cat-Cow Do these steps until your lower back bends more easily: 1. Get on your hands and knees on a firm surface. Keep your hands under your shoulders, and keep your knees under your hips. You may put padding under your knees. 2. Let your head hang down, and make your tailbone point down to the floor so your lower back is round like the back of a cat. 3. Stay in this position for 5 seconds. 4. Slowly lift  your head and make your tailbone point up to the ceiling so your back hangs low (sags) like the back of a cow. 5. Stay in this position for 5 seconds.  Press-Ups Do these steps 5-10 times in a row: 1. Lie on your belly (face-down) on the floor. 2. Place your hands near your head, about shoulder-width apart. 3. While you keep your back relaxed and keep your hips on the floor, slowly straighten your arms to raise the top half of your body and lift your shoulders. Do not use your back muscles. To make yourself more comfortable, you may change where you place your hands. 4. Stay in this position for 5 seconds. 5. Slowly return to lying flat on the floor.  Bridges Do these steps 10 times in a row: 1. Lie on your back on a firm surface. 2. Bend your knees so they point up to the ceiling. Your feet should be flat on the floor. 3. Tighten your butt muscles and lift your butt off of the floor until your waist is almost as high as your knees. If you do not feel the muscles working in your butt and the back of your thighs, slide your feet 1-2 inches farther away from your butt. 4. Stay in this position for 3-5 seconds. 5.  Slowly lower your butt to the floor, and let your butt muscles relax. If this exercise is too easy, try doing it with your arms crossed over your chest. Belly Crunches Do these steps 5-10 times in a row: 1. Lie on your back on a firm bed or the floor with your legs stretched out. 2. Bend your knees so they point up to the ceiling. Your feet should be flat on the floor. 3. Cross your arms over your chest. 4. Tip your chin a little bit toward your chest but do not bend your neck. 5. Tighten your belly muscles and slowly raise your chest just enough to lift your shoulder blades a tiny bit off of the floor. 6. Slowly lower your chest and your head to the floor. Back Lifts Do these steps 5-10 times in a row: 1. Lie on your belly (face-down) with your arms at your sides, and rest your  forehead on the floor. 2. Tighten the muscles in your legs and your butt. 3. Slowly lift your chest off of the floor while you keep your hips on the floor. Keep the back of your head in line with the curve in your back. Look at the floor while you do this. 4. Stay in this position for 3-5 seconds. 5. Slowly lower your chest and your face to the floor. Contact a doctor if:  Your back pain gets a lot worse when you do an exercise.  Your back pain does not lessen 2 hours after you exercise. If you have any of these problems, stop doing the exercises. Do not do them again unless your doctor says it is okay. Get help right away if:  You have sudden, very bad back pain. If this happens, stop doing the exercises. Do not do them again unless your doctor says it is okay. This information is not intended to replace advice given to you by your health care provider. Make sure you discuss any questions you have with your health care provider. Document Released: 03/29/2010 Document Revised: 11/18/2017 Document Reviewed: 04/20/2014 Elsevier Interactive Patient Education  [employer].

## 2018-07-12 NOTE — Progress Notes (Signed)
Subjective:    Patient ID: Samuel Cantrell, male    DOB: [birth year], 43 y.o.   MRN: [id number]  HPI  Virtual Visit via Video Note  I connected with [name] on 07/12/18 at  8:00 AM EDT by a video enabled telemedicine application and verified that I am speaking with the correct person using two identifiers.  Location: Patient: home Provider: office.   I discussed the limitations of evaluation and management by telemedicine and the availability of in person appointments. The patient expressed understanding and agreed to proceed.   History of Present Illness:  Pt has htn. No cardiac or neurologic signs or symptoms. Hx of htn. Pt is on norvasc and coreg. Most of time bp between 80-90 diastolic.  Pt also states back has been hurting. About 1.5 month ago. No fall or injury. Developed lower back pain. Pain was present lower back transiently. Some pain that shoot to leg and tingling sensation more tingling in his feet.   Observations/Objective: See objective   Assessment and Plan: Your blood pressure is relatively well controlled your diastolic bp is usually. Refilled bp med. Get cmp today.   For back pain. Recommend tylenol, low dose flexeril and gabapentin. Will get xray today.   Follow up in 3 weeks or as needed  Follow Up Instructions:    I discussed the assessment and treatment plan with the patient. The patient was provided an opportunity to ask questions and all were answered. The patient agreed with the plan and demonstrated an understanding of the instructions.   The patient was advised to call back or seek an in-person evaluation if the symptoms worsen or if the condition fails to improve as anticipated.     Esperanza Richters, PA-C    Review of Systems  Constitutional: Negative for chills, fatigue and fever.  Respiratory: Negative for cough, chest tightness, shortness of breath and wheezing.   Cardiovascular: Negative for chest pain and palpitations.    Gastrointestinal: Negative for abdominal pain, blood in stool, nausea and vomiting.  Genitourinary: Negative for dysuria and flank pain.  Musculoskeletal: Positive for back pain. Negative for myalgias and neck pain.  Skin: Negative for rash.  Neurological: Negative for dizziness, seizures, syncope, weakness and light-headedness.  Hematological: Negative for adenopathy. Does not bruise/bleed easily.  Psychiatric/Behavioral: Negative for behavioral problems and confusion.   Past Medical History:  Diagnosis Date   Allergy    GERD (gastroesophageal reflux disease)    Hypertensive urgency 02/28/2016     Social History   Socioeconomic History   Marital status: Married    Spouse name: Not on file   Number of children: Not on file   Years of education: Not on file   Highest education level: Not on file  Occupational History   Occupation: Astronomer  Social Needs   Financial resource strain: Not on file   Food insecurity:    Worry: Not on file    Inability: Not on file   Transportation needs:    Medical: Not on file    Non-medical: Not on file  Tobacco Use   Smoking status: Current Every Day Smoker    Packs/day: 0.50    Years: 8.00    Pack years: 4.00    Types: Cigarettes   Smokeless tobacco: Never Used  Substance and Sexual Activity   Alcohol use: Yes    Comment: every other night 2 beers   Drug use: No   Sexual activity: Yes  Lifestyle   Physical activity:  Days per week: Not on file    Minutes per session: Not on file   Stress: Not on file  Relationships   Social connections:    Talks on phone: Not on file    Gets together: Not on file    Attends religious service: Not on file    Active member of club or organization: Not on file    Attends meetings of clubs or organizations: Not on file    Relationship status: Not on file   Intimate partner violence:    Fear of current or ex partner: Not on file    Emotionally abused: Not on file     Physically abused: Not on file    Forced sexual activity: Not on file  Other Topics Concern   Not on file  Social History Narrative   Pt lives with wife.    Past Surgical History:  Procedure Laterality Date   NO PAST SURGERIES      Family History  Problem Relation Age of Onset   Hypertension Mother     No Known Allergies  Current Outpatient Medications on File Prior to Visit  Medication Sig Dispense Refill   amLODipine (NORVASC) 2.5 MG tablet Take 1 tablet (2.5 mg total) by mouth daily. 30 tablet 0   carvedilol (COREG) 12.5 MG tablet TAKE 1 TABLET(12.5 MG) BY MOUTH TWICE DAILY WITH A MEAL 7 tablet 0   diphenhydrAMINE (BENADRYL) 25 MG tablet Take 25 mg by mouth every 6 (six) hours as needed for itching or allergies.     fluticasone (FLONASE) 50 MCG/ACT nasal spray SHAKE LIQUID AND USE 2 SPRAYS IN EACH NOSTRIL DAILY 16 g 0   ibuprofen (ADVIL,MOTRIN) 200 MG tablet Take by mouth.     lisinopril (ZESTRIL) 5 MG tablet TAKE 1 TABLET BY MOUTH EVERY DAY 7 tablet 0   potassium chloride (K-DUR,KLOR-CON) 10 MEQ tablet TAKE 1 TABLET BY MOUTH DAILY FOR 7 DAYS, THEN TAKE 1 TABLET EVERY OTHER DAY 30 tablet 0   No current facility-administered medications on file prior to visit.     BP (!) 136/98    Pulse 89       Objective:   Physical Exam  General Mental Status- Alert. General Appearance- Not in acute distress.   Skin General: Color- Normal Color. Moisture- Normal Moisture.  Neck Carotid Arteries- Normal color. Moisture- Normal Moisture. No carotid bruits. No JVD.  Chest and Lung Exam Auscultation: Breath Sounds:-Normal.  Cardiovascular Auscultation:Rythm- Regular. Murmurs & Other Heart Sounds:Auscultation of the heart reveals- No Murmurs.  Back- when he press mid lumbar area no pain.   Neurologic Cranial Nerve exam:- CN III-XII intact(No nystagmus), symmetric smile. Gross motor function appears intact.      Assessment & Plan:

## 2018-07-22 ENCOUNTER — Telehealth: Payer: Self-pay | Admitting: Medical

## 2018-07-22 NOTE — Telephone Encounter (Signed)
Copied from CRM [id number]. Topic: Quick Communication - Rx Refill/Question >> Jul 22, 2018  4:58 PM Aretta Nip wrote: CRM for notification. See Telephone encounter for: 07/22/18. carvedilol (COREG) 12.5 MG tablet  [id number] - [place], [place] - [address] AT [facility] [address] [phone] (Phone) [phone] (Fax)  Pt wants advice. He has been getting 60 pills and the script last week was for only 11 pills. He takes them twice a day and has only 6 left. Please reach out to pt. After hours. Pt call back [phone] call in normal dosage

## 2018-07-23 ENCOUNTER — Other Ambulatory Visit: Payer: Self-pay

## 2018-07-23 ENCOUNTER — Ambulatory Visit (HOSPITAL_BASED_OUTPATIENT_CLINIC_OR_DEPARTMENT_OTHER)
Admission: RE | Admit: 2018-07-23 | Discharge: 2018-07-23 | Disposition: A | Discharge status: Home / Self Care | Payer: Self-pay | Source: Ambulatory Visit | Attending: Medical | Admitting: Medical

## 2018-07-23 DIAGNOSIS — M5441 Lumbago with sciatica, right side: Secondary | ICD-10-CM | POA: Insufficient documentation

## 2018-07-23 MED ORDER — CARVEDILOL 12.5 MG PO TABS: ORAL_TABLET | ORAL | 5 refills | Status: DC

## 2018-07-23 NOTE — Telephone Encounter (Signed)
Rx resent for #60 and 5rf.

## 2018-07-26 ENCOUNTER — Telehealth: Payer: Self-pay | Admitting: Medical

## 2018-07-26 DIAGNOSIS — M5441 Lumbago with sciatica, right side: Secondary | ICD-10-CM

## 2018-07-26 NOTE — Telephone Encounter (Signed)
Referral to sports med placed.

## 2018-07-29 ENCOUNTER — Other Ambulatory Visit: Payer: Self-pay | Admitting: Family Medicine

## 2018-07-29 ENCOUNTER — Other Ambulatory Visit: Payer: Self-pay

## 2018-07-29 ENCOUNTER — Ambulatory Visit (INDEPENDENT_AMBULATORY_CARE_PROVIDER_SITE_OTHER): Payer: Self-pay | Admitting: Family Medicine

## 2018-07-29 ENCOUNTER — Encounter: Payer: Self-pay | Admitting: Family Medicine

## 2018-07-29 VITALS — BP 128/85 | HR 94 | Ht 67.0 in | Wt 140.0 lb

## 2018-07-29 DIAGNOSIS — G629 Polyneuropathy, unspecified: Secondary | ICD-10-CM | POA: Insufficient documentation

## 2018-07-29 MED ORDER — PREDNISONE 5 MG PO TABS: ORAL_TABLET | ORAL | 0 refills | Status: DC

## 2018-07-29 NOTE — Progress Notes (Signed)
[name] - 43 y.o. male MRN [id number]  Date of birth: March 05, 1976  SUBJECTIVE:  Including CC & ROS.  Chief Complaint  Patient presents with  . Back Pain    low back x 2 months    Samuel Cantrell is a 43 y.o. male that is presenting with numbness in his feet.  He has mild symptoms occurring down each leg.  It is more anterior down the leg.  Denies any inciting event or trauma.  This is started about 2 months ago.  Prior to that he was working and did not have any of the symptoms.  Symptoms seem to be constant.  Does not seem to be worse with any specific trigger.  Has not had much difference with the gabapentin.  He has sensation in his feet but they does not feel like they normally do.  No history of diabetes.  He does have a history of an upper extremity DVT with a protein C deficiency and a positive lupus anticoagulant.  He denies any new or different exercise routines as of late.  Has not been working recently due to the coronavirus.  Denies any saddle anesthesia or urinary incontinence.  Independent review of the lumbar spine from 5/15 shows mild dextroscoliosis.   Review of Systems  Constitutional: Negative for fever.  HENT: Negative for congestion.   Respiratory: Negative for cough.   Cardiovascular: Negative for chest pain.  Gastrointestinal: Negative for abdominal distention.  Musculoskeletal: Negative for back pain.  Skin: Negative for color change.  Neurological: Positive for numbness.  Hematological: Negative for adenopathy.  Psychiatric/Behavioral: Negative for hallucinations.    HISTORY: Past Medical, Surgical, Social, and Family History Reviewed & Updated per EMR.   Pertinent Historical Findings include:  Past Medical History:  Diagnosis Date  . Allergy   . GERD (gastroesophageal reflux disease)   . Hypertensive urgency 02/28/2016    Past Surgical History:  Procedure Laterality Date  . NO PAST SURGERIES      No Known Allergies  Family History   Problem Relation Age of Onset  . Hypertension Mother      Social History   Socioeconomic History  . Marital status: Married    Spouse name: Not on file  . Number of children: Not on file  . Years of education: Not on file  . Highest education level: Not on file  Occupational History  . Occupation: Astronomer  Social Needs  . Financial resource strain: Not on file  . Food insecurity:    Worry: Not on file    Inability: Not on file  . Transportation needs:    Medical: Not on file    Non-medical: Not on file  Tobacco Use  . Smoking status: Current Every Day Smoker    Packs/day: 0.50    Years: 8.00    Pack years: 4.00    Types: Cigarettes  . Smokeless tobacco: Never Used  Substance and Sexual Activity  . Alcohol use: Yes    Comment: every other night 2 beers  . Drug use: No  . Sexual activity: Yes  Lifestyle  . Physical activity:    Days per week: Not on file    Minutes per session: Not on file  . Stress: Not on file  Relationships  . Social connections:    Talks on phone: Not on file    Gets together: Not on file    Attends religious service: Not on file    Active member of club or organization:  Not on file    Attends meetings of clubs or organizations: Not on file    Relationship status: Not on file  . Intimate partner violence:    Fear of current or ex partner: Not on file    Emotionally abused: Not on file    Physically abused: Not on file    Forced sexual activity: Not on file  Other Topics Concern  . Not on file  Social History Narrative   Pt lives with wife.     PHYSICAL EXAM:  VS: BP 128/85   Pulse 94   Ht 5\' 7"  (1.702 m)   Wt 140 lb (63.5 kg)   BMI 21.93 kg/m  Physical Exam Gen: NAD, alert, cooperative with exam, well-appearing ENT: normal lips, normal nasal mucosa,  Eye: normal EOM, normal conjunctiva and lids CV:  no edema, +2 pedal pulses   Resp: no accessory muscle use, non-labored,  Skin: no rashes, no areas of induration   Neuro: normal tone, normal sensation to touch Psych:  normal insight, alert and oriented MSK:  Left and right leg:  No signs of atrophy. Normal strength resistance with hip flexion, knee flexion extension, plantarflexion and dorsiflexion. Normal internal and external rotation of the hips. No tenderness to palpation of the greater trochanters or SI joints. +2 deep tendon reflexes that are symmetric at the patella. Normal gait. Neurovascular intact     ASSESSMENT & PLAN:   Polyneuropathy Symptoms seem to be more associated with neuropathy as opposed to being radiculopathy.  Does not appear to have any significant herniation with no significant pain.  More of an altered sensation in his feet.  He does have a history of elevated liver enzymes.  Has a history of protein C deficiency and unclear if this can contribute in any way.  Does not appear to have any form of clot currently. -CMP. -Prednisone.  Can stop the gabapentin if he feels that it is not helping. -May need to have a further work-up on his liver if enzymes are still elevating versus an EMG.

## 2018-07-29 NOTE — Assessment & Plan Note (Signed)
Symptoms seem to be more associated with neuropathy as opposed to being radiculopathy.  Does not appear to have any significant herniation with no significant pain.  More of an altered sensation in his feet.  He does have a history of elevated liver enzymes.  Has a history of protein C deficiency and unclear if this can contribute in any way.  Does not appear to have any form of clot currently. -CMP. -Prednisone.  Can stop the gabapentin if he feels that it is not helping. -May need to have a further work-up on his liver if enzymes are still elevating versus an EMG.

## 2018-07-29 NOTE — Patient Instructions (Signed)
Nice to meet you  Please try the exercises  Please try the prednisone. You can stop the gabapentin if you don't feel that it helps.  Please send me a message in MyChart with any questions or updates.  Please see me back in 4.  I will call you with the results from today.   --Dr. Jordan Likes

## 2018-07-30 LAB — COMPREHENSIVE METABOLIC PANEL
ALT: 64 IU/L — ABNORMAL HIGH (ref 0–44)
AST: 228 IU/L — ABNORMAL HIGH (ref 0–40)
Albumin/Globulin Ratio: 1.5 (ref 1.2–2.2)
Albumin: 4.8 g/dL (ref 4.0–5.0)
Alkaline Phosphatase: 119 IU/L — ABNORMAL HIGH (ref 39–117)
BUN/Creatinine Ratio: 15 (ref 9–20)
BUN: 8 mg/dL (ref 6–24)
Bilirubin Total: 0.5 mg/dL (ref 0.0–1.2)
CO2: 28 mmol/L (ref 20–29)
Calcium: 9 mg/dL (ref 8.7–10.2)
Chloride: 94 mmol/L — ABNORMAL LOW (ref 96–106)
Creatinine, Ser: 0.52 mg/dL — ABNORMAL LOW (ref 0.76–1.27)
GFR calc Af Amer: 149 mL/min/{1.73_m2} (ref >59)
GFR calc non Af Amer: 129 mL/min/{1.73_m2} (ref >59)
Globulin, Total: 3.3 g/dL (ref 1.5–4.5)
Glucose: 92 mg/dL (ref 65–99)
Potassium: 2.8 mmol/L — ABNORMAL LOW (ref 3.5–5.2)
Sodium: 141 mmol/L (ref 134–144)
Total Protein: 8.1 g/dL (ref 6.0–8.5)

## 2018-08-03 ENCOUNTER — Ambulatory Visit (INDEPENDENT_AMBULATORY_CARE_PROVIDER_SITE_OTHER): Payer: Self-pay | Admitting: Medical

## 2018-08-03 ENCOUNTER — Telehealth: Payer: Self-pay | Admitting: Family Medicine

## 2018-08-03 ENCOUNTER — Encounter: Payer: Self-pay | Admitting: Medical

## 2018-08-03 ENCOUNTER — Other Ambulatory Visit: Payer: Self-pay

## 2018-08-03 VITALS — BP 156/83 | Temp 95.9°F | Ht 67.0 in

## 2018-08-03 DIAGNOSIS — R748 Abnormal levels of other serum enzymes: Secondary | ICD-10-CM

## 2018-08-03 DIAGNOSIS — R739 Hyperglycemia, unspecified: Secondary | ICD-10-CM

## 2018-08-03 DIAGNOSIS — R7989 Other specified abnormal findings of blood chemistry: Secondary | ICD-10-CM

## 2018-08-03 DIAGNOSIS — G629 Polyneuropathy, unspecified: Secondary | ICD-10-CM

## 2018-08-03 DIAGNOSIS — R945 Abnormal results of liver function studies: Secondary | ICD-10-CM

## 2018-08-03 NOTE — Telephone Encounter (Signed)
Spoke with patient about lab results. May need further work up for his liver. Is finishing the prednisone.   Myra Rude, MD [facility] 08/03/2018, 8:36 AM

## 2018-08-03 NOTE — Patient Instructions (Addendum)
For lower ext neuropathy, I placed order for b12, b1, folate, and a1c.   For increased lft, I placed cmp, hepatitis panel and abdomen ultrasound.   Recommended stop drinking alcohol.   No further back pain, so doubt radicular pain. Will continue to hold gabapentin presently.  Bp little high today but better last week. When in for next visit please bring your bp cuff so can evaluate if giving good readings.  Follow up to be determined after lab review.

## 2018-08-03 NOTE — Progress Notes (Signed)
Subjective:    Patient ID: Samuel Cantrell, male    DOB: [birth year], 43 y.o.   MRN: [id number]  HPI  Virtual Visit via Video Note  I connected with Samuel Cantrell on 08/03/18 at 10:40 AM EDT by a video enabled telemedicine application and verified that I am speaking with the correct person using two identifiers.  Location: Patient: home Provider: home.   I discussed the limitations of evaluation and management by telemedicine and the availability of in person appointments. The patient expressed understanding and agreed to proceed.  History of Present Illness:   Pt states he only has numbness to bottom of his feet. When symptoms started he states had some back pain but now no further back pain. That went away about 1-2 weeks ago. He states by time he saw sport med his back pain had resolved. See my last note. No leg cramping/signs or claudication.  Pt has some liver enzymes. Pt admits drinking couple of shots a day. Sometime more.  Pt bp mild high today initially. No cardiac or neurologic signs or symptoms. Last   Observations/Objective: General-no acute distress, pleasant, oriented. Lungs- on inspection lungs appear unlabored. Neck- no tracheal deviation or jvd on inspection. Neuro- gross motor function appears intact.  Assessment and Plan:  For lower ext neuropathy, I placed order for b12, b1, folate, and a1c.   For increased lft, I placed cmp, hepatitis panel and abdomen ultrasound.   Recommended stop drinking alcohol.   No further back pain, so doubt radicular pain. Will continue to hold gabapentin presently.  Bp little high today but better last week. When in for next visit please bring your bp cuff so can evaluate if giving good readings.  Follow up to be determined after lab review.  Follow Up Instructions:    I discussed the assessment and treatment plan with the patient. The patient was provided an opportunity to ask questions and all were answered.  The patient agreed with the plan and demonstrated an understanding of the instructions.   The patient was advised to call back or seek an in-person evaluation if the symptoms worsen or if the condition fails to improve as anticipated.     Esperanza Richters, PA-C    Review of Systems  Constitutional: Negative for chills, fatigue and fever.  Respiratory: Negative for cough, chest tightness, shortness of breath and wheezing.   Cardiovascular: Negative for chest pain and palpitations.  Gastrointestinal: Negative for abdominal distention, abdominal pain, blood in stool, constipation, diarrhea, nausea and vomiting.  Skin: Negative for rash.  Neurological: Negative for dizziness, speech difficulty, weakness, numbness and headaches.       Lower ext numbness.bottom of feet.  Hematological: Negative for adenopathy. Does not bruise/bleed easily.  Psychiatric/Behavioral: Negative for behavioral problems, confusion, sleep disturbance and suicidal ideas. The patient is not nervous/anxious.    Past Medical History:  Diagnosis Date  . Allergy   . GERD (gastroesophageal reflux disease)   . Hypertensive urgency 02/28/2016     Social History   Socioeconomic History  . Marital status: Married    Spouse name: Not on file  . Number of children: Not on file  . Years of education: Not on file  . Highest education level: Not on file  Occupational History  . Occupation: Astronomer  Social Needs  . Financial resource strain: Not on file  . Food insecurity:    Worry: Not on file    Inability: Not on file  . Transportation needs:  Medical: Not on file    Non-medical: Not on file  Tobacco Use  . Smoking status: Current Every Day Smoker    Packs/day: 0.50    Years: 8.00    Pack years: 4.00    Types: Cigarettes  . Smokeless tobacco: Never Used  Substance and Sexual Activity  . Alcohol use: Yes    Comment: every other night 2 beers  . Drug use: No  . Sexual activity: Yes  Lifestyle  .  Physical activity:    Days per week: Not on file    Minutes per session: Not on file  . Stress: Not on file  Relationships  . Social connections:    Talks on phone: Not on file    Gets together: Not on file    Attends religious service: Not on file    Active member of club or organization: Not on file    Attends meetings of clubs or organizations: Not on file    Relationship status: Not on file  . Intimate partner violence:    Fear of current or ex partner: Not on file    Emotionally abused: Not on file    Physically abused: Not on file    Forced sexual activity: Not on file  Other Topics Concern  . Not on file  Social History Narrative   Pt lives with wife.    Past Surgical History:  Procedure Laterality Date  . NO PAST SURGERIES      Family History  Problem Relation Age of Onset  . Hypertension Mother     No Known Allergies  Current Outpatient Medications on File Prior to Visit  Medication Sig Dispense Refill  . amLODipine (NORVASC) 2.5 MG tablet Take 1 tablet (2.5 mg total) by mouth daily. 30 tablet 0  . carvedilol (COREG) 12.5 MG tablet TAKE 1 TABLET(12.5 MG) BY MOUTH TWICE DAILY WITH A MEAL 60 tablet 5  . cyclobenzaprine (FLEXERIL) 5 MG tablet Take 1 tablet (5 mg total) by mouth at bedtime. 14 tablet 0  . diphenhydrAMINE (BENADRYL) 25 MG tablet Take 25 mg by mouth every 6 (six) hours as needed for itching or allergies.    . fluticasone (FLONASE) 50 MCG/ACT nasal spray SHAKE LIQUID AND USE 2 SPRAYS IN EACH NOSTRIL DAILY 16 g 0  . gabapentin (NEURONTIN) 100 MG capsule Take 1 capsule (100 mg total) by mouth at bedtime. 30 capsule 0  . ibuprofen (ADVIL,MOTRIN) 200 MG tablet Take by mouth.    [name] Kitchen. lisinopril (ZESTRIL) 5 MG tablet TAKE 1 TABLET BY MOUTH EVERY DAY 30 tablet 11  . potassium chloride (K-DUR,KLOR-CON) 10 MEQ tablet TAKE 1 TABLET BY MOUTH DAILY FOR 7 DAYS, THEN TAKE 1 TABLET EVERY OTHER DAY 30 tablet 0  . predniSONE (DELTASONE) 5 MG tablet Take 6 pills for first  day, 5 pills second day, 4 pills third day, 3 pills fourth day, 2 pills the fifth day, and 1 pill sixth day. 21 tablet 0   No current facility-administered medications on file prior to visit.     BP (!) 156/83 Comment: last week 128/85.  Temp (!) 95.9 F (35.5 C) (Oral)   Ht 5\' 7"  (1.702 m)   BMI 21.93 kg/m       Objective:   Physical Exam        Assessment & Plan:

## 2018-08-10 ENCOUNTER — Telehealth: Payer: Self-pay | Admitting: Medical

## 2018-08-10 ENCOUNTER — Other Ambulatory Visit (INDEPENDENT_AMBULATORY_CARE_PROVIDER_SITE_OTHER): Payer: Self-pay

## 2018-08-10 ENCOUNTER — Other Ambulatory Visit: Payer: Self-pay

## 2018-08-10 ENCOUNTER — Ambulatory Visit (HOSPITAL_BASED_OUTPATIENT_CLINIC_OR_DEPARTMENT_OTHER)
Admission: RE | Admit: 2018-08-10 | Discharge: 2018-08-10 | Disposition: A | Discharge status: Home / Self Care | Payer: Self-pay | Source: Ambulatory Visit | Attending: Medical | Admitting: Medical

## 2018-08-10 DIAGNOSIS — R945 Abnormal results of liver function studies: Secondary | ICD-10-CM

## 2018-08-10 DIAGNOSIS — E876 Hypokalemia: Secondary | ICD-10-CM

## 2018-08-10 DIAGNOSIS — R748 Abnormal levels of other serum enzymes: Secondary | ICD-10-CM

## 2018-08-10 DIAGNOSIS — R7989 Other specified abnormal findings of blood chemistry: Secondary | ICD-10-CM

## 2018-08-10 DIAGNOSIS — E538 Deficiency of other specified B group vitamins: Secondary | ICD-10-CM

## 2018-08-10 DIAGNOSIS — R739 Hyperglycemia, unspecified: Secondary | ICD-10-CM

## 2018-08-10 DIAGNOSIS — G629 Polyneuropathy, unspecified: Secondary | ICD-10-CM

## 2018-08-10 DIAGNOSIS — I700 Atherosclerosis of aorta: Secondary | ICD-10-CM

## 2018-08-10 LAB — COMPREHENSIVE METABOLIC PANEL
ALT: 34 U/L (ref 0–53)
AST: 102 U/L — ABNORMAL HIGH (ref 0–37)
Albumin: 4.3 g/dL (ref 3.5–5.2)
Alkaline Phosphatase: 105 U/L (ref 39–117)
BUN: 9 mg/dL (ref 6–23)
CO2: 34 mEq/L — ABNORMAL HIGH (ref 19–32)
Calcium: 8.9 mg/dL (ref 8.4–10.5)
Chloride: 92 mEq/L — ABNORMAL LOW (ref 96–112)
Creatinine, Ser: 0.52 mg/dL (ref 0.40–1.50)
GFR: 207.68 mL/min (ref >60.00)
Glucose, Bld: 84 mg/dL (ref 70–99)
Potassium: 2.9 mEq/L — ABNORMAL LOW (ref 3.5–5.1)
Sodium: 139 mEq/L (ref 135–145)
Total Bilirubin: 0.6 mg/dL (ref 0.2–1.2)
Total Protein: 7.7 g/dL (ref 6.0–8.3)

## 2018-08-10 LAB — FOLATE: Folate: 1.8 ng/mL — ABNORMAL LOW (ref >5.9)

## 2018-08-10 LAB — GAMMA GT: GGT: 479 U/L — ABNORMAL HIGH (ref 7–51)

## 2018-08-10 LAB — HEMOGLOBIN A1C: Hgb A1c MFr Bld: 6.1 % (ref 4.6–6.5)

## 2018-08-10 LAB — VITAMIN B12: Vitamin B-12: 291 pg/mL (ref 211–911)

## 2018-08-10 MED ORDER — FOLIC ACID 1 MG PO TABS: 1.0000 mg | ORAL_TABLET | Freq: Every day | ORAL | 0 refills | Status: DC

## 2018-08-10 MED ORDER — POTASSIUM CHLORIDE ER 10 MEQ PO TBCR: 10.0000 meq | EXTENDED_RELEASE_TABLET | Freq: Every day | ORAL | 0 refills | Status: DC

## 2018-08-10 NOTE — Telephone Encounter (Signed)
Future labs placed. 

## 2018-08-10 NOTE — Telephone Encounter (Signed)
Future order lipid panel. Placed. Please get him scheduled.

## 2018-08-14 LAB — VITAMIN B1: Vitamin B1 (Thiamine): <6 nmol/L — ABNORMAL LOW (ref 8–30)

## 2018-08-15 ENCOUNTER — Telehealth: Payer: Self-pay | Admitting: Medical

## 2018-08-15 DIAGNOSIS — K760 Fatty (change of) liver, not elsewhere classified: Secondary | ICD-10-CM

## 2018-08-15 DIAGNOSIS — R748 Abnormal levels of other serum enzymes: Secondary | ICD-10-CM

## 2018-08-15 NOTE — Telephone Encounter (Signed)
Future comprehensive hepatitis panel placed.

## 2018-08-17 ENCOUNTER — Encounter: Payer: Self-pay | Admitting: Medical

## 2018-08-17 ENCOUNTER — Ambulatory Visit (INDEPENDENT_AMBULATORY_CARE_PROVIDER_SITE_OTHER): Payer: Self-pay | Admitting: Medical

## 2018-08-17 ENCOUNTER — Other Ambulatory Visit: Payer: Self-pay

## 2018-08-17 VITALS — BP 138/91 | HR 83

## 2018-08-17 DIAGNOSIS — E538 Deficiency of other specified B group vitamins: Secondary | ICD-10-CM

## 2018-08-17 DIAGNOSIS — F1010 Alcohol abuse, uncomplicated: Secondary | ICD-10-CM

## 2018-08-17 DIAGNOSIS — G629 Polyneuropathy, unspecified: Secondary | ICD-10-CM

## 2018-08-17 DIAGNOSIS — K760 Fatty (change of) liver, not elsewhere classified: Secondary | ICD-10-CM

## 2018-08-17 NOTE — Progress Notes (Signed)
   Subjective:    Patient ID: Samuel Cantrell, male    DOB: 11-Sep-1975, 43 y.o.   MRN: [id number]  HPI  Virtual Visit via Video Note  I connected with Samuel Cantrell on 08/17/18 at 10:20 AM EDT by a video enabled telemedicine application and verified that I am speaking with the correct person using two identifiers.    Location: Patient:home Provider: home   I discussed the limitations of evaluation and management by telemedicine and the availability of in person appointments. The patient expressed understanding and agreed to proceed.  History of Present Illness:  Pt states he feels some tingling and numbness in his toes. He is not having any cramps. Overall going on for about 2-3 months.  A month ago or so he states had some back pain but this now none and still has tingling to his toes. This sensation does not keep him up at night.  Has seen sports med when had back pain. MD thought he had neuropathy and not radiculopathy.  Pt does drink alcohol. Pt admits that he drinks 2-3 shots of liquor 5 or more days a week. He states he was drinking more when he was working.    Observations/Objective:  General-no acute distress, pleasant, oriented. Lungs- on inspection lungs appear unlabored. Neck- no tracheal deviation or jvd on inspection. Neuro- gross motor function appears intact.   Assessment and Plan: Your neuropathy is likely related to low b1 and folate. Please start folic acid and b1 vitamin today. Also get b12 otc since on lower end of normal. Will see if this resolves neuropathy. If not consider expand work up.  For alcohol abuse advised stop use ad explained led to the above. Counseled and educated pt.   25 minute + appointment. 50% of time counseling.  Follow up in one month for repeat b1 and folate  Follow Up Instructions:    I discussed the assessment and treatment plan with the patient. The patient was provided an opportunity to ask questions and all were  answered. The patient agreed with the plan and demonstrated an understanding of the instructions.   The patient was advised to call back or seek an in-person evaluation if the symptoms worsen or if the condition fails to improve as anticipated.  I provided 25 +minutes of non-face-to-face time during this encounter.   Mackie Pai, PA-C    Review of Systems  Constitutional: Negative for chills, fatigue and fever.  Respiratory: Negative for cough, chest tightness, shortness of breath and wheezing.   Cardiovascular: Negative for chest pain and palpitations.  Gastrointestinal: Negative for abdominal pain, diarrhea, nausea and vomiting.  Genitourinary: Negative for dysuria and flank pain.  Musculoskeletal: Negative for back pain.  Skin: Negative for rash.  Neurological:       Neuropathy. Tingling to toes.  Psychiatric/Behavioral: Negative for behavioral problems, confusion and sleep disturbance. The patient is not nervous/anxious.        Objective:   Physical Exam        Assessment & Plan:

## 2018-08-17 NOTE — Patient Instructions (Addendum)
Your neuropathy is likely related to low b1 and folate. Please start folic acid and b1 vitamin today. Also get b12 otc since on lower end of normal. Will see if this resolves neuropathy. If not consider expand work up.  For alcohol abuse advised stop use ad explained led to the above. Counseled and educated pt.   25 minute + appointment. 50% of time counseling.  Follow up in one month for repeat b1 and folate

## 2018-08-19 ENCOUNTER — Other Ambulatory Visit (INDEPENDENT_AMBULATORY_CARE_PROVIDER_SITE_OTHER): Payer: Self-pay

## 2018-08-19 ENCOUNTER — Other Ambulatory Visit: Payer: Self-pay

## 2018-08-19 DIAGNOSIS — G629 Polyneuropathy, unspecified: Secondary | ICD-10-CM

## 2018-08-19 DIAGNOSIS — E538 Deficiency of other specified B group vitamins: Secondary | ICD-10-CM

## 2018-08-19 DIAGNOSIS — K760 Fatty (change of) liver, not elsewhere classified: Secondary | ICD-10-CM

## 2018-08-19 DIAGNOSIS — R748 Abnormal levels of other serum enzymes: Secondary | ICD-10-CM

## 2018-08-19 DIAGNOSIS — E876 Hypokalemia: Secondary | ICD-10-CM

## 2018-08-19 LAB — COMPREHENSIVE METABOLIC PANEL
ALT: 41 U/L (ref 0–53)
AST: 139 U/L — ABNORMAL HIGH (ref 0–37)
Albumin: 4.3 g/dL (ref 3.5–5.2)
Alkaline Phosphatase: 123 U/L — ABNORMAL HIGH (ref 39–117)
BUN: 7 mg/dL (ref 6–23)
CO2: 32 mEq/L (ref 19–32)
Calcium: 9 mg/dL (ref 8.4–10.5)
Chloride: 96 mEq/L (ref 96–112)
Creatinine, Ser: 0.55 mg/dL (ref 0.40–1.50)
GFR: 194.64 mL/min (ref >60.00)
Glucose, Bld: 97 mg/dL (ref 70–99)
Potassium: 2.9 mEq/L — ABNORMAL LOW (ref 3.5–5.1)
Sodium: 141 mEq/L (ref 135–145)
Total Bilirubin: 0.5 mg/dL (ref 0.2–1.2)
Total Protein: 7.5 g/dL (ref 6.0–8.3)

## 2018-08-19 LAB — FOLATE: Folate: >23.9 ng/mL (ref >5.9)

## 2018-08-23 LAB — HEPATITIS A ANTIBODY, TOTAL: Hepatitis A AB,Total: NONREACTIVE

## 2018-08-23 LAB — HEPATITIS B CORE ANTIBODY, TOTAL: Hep B Core Total Ab: NONREACTIVE

## 2018-08-23 LAB — HEPATITIS B E ANTIBODY: Hep B E Ab: NONREACTIVE

## 2018-08-23 LAB — VITAMIN B1: Vitamin B1 (Thiamine): 87 nmol/L — ABNORMAL HIGH (ref 8–30)

## 2018-08-23 LAB — HEPATITIS C ANTIBODY
Hepatitis C Ab: NONREACTIVE
SIGNAL TO CUT-OFF: 0.02 (ref <1.00)

## 2018-08-23 LAB — HEPATITIS B SURFACE ANTIBODY,QUALITATIVE: Hep B S Ab: NONREACTIVE

## 2018-08-26 ENCOUNTER — Other Ambulatory Visit: Payer: Self-pay

## 2018-08-26 ENCOUNTER — Ambulatory Visit (INDEPENDENT_AMBULATORY_CARE_PROVIDER_SITE_OTHER): Payer: Self-pay | Admitting: Family Medicine

## 2018-08-26 ENCOUNTER — Encounter: Payer: Self-pay | Admitting: Family Medicine

## 2018-08-26 VITALS — BP 152/107 | HR 108 | Ht 67.0 in | Wt 145.0 lb

## 2018-08-26 DIAGNOSIS — G629 Polyneuropathy, unspecified: Secondary | ICD-10-CM

## 2018-08-26 MED ORDER — TRIAMCINOLONE ACETONIDE 40 MG/ML IJ SUSP
20.0000 mg | Freq: Once | INTRAMUSCULAR | Status: AC
  Administered 2018-08-26: 20 mg via INTRA_ARTICULAR

## 2018-08-26 NOTE — Patient Instructions (Signed)
Good to see you Please try the vimovo. You can take 1 pill twice a day as needed. Let me know if it helps.  Please continue the vitamins.   Please send me a message in MyChart with any questions or updates.  I will call you once the nerve study is completed.   --Dr. Raeford Razor

## 2018-08-26 NOTE — Progress Notes (Signed)
Samuel Cantrell - 43 y.o. male MRN [id number]  Date of birth: [birth year]  SUBJECTIVE:  Including CC & ROS.  Chief Complaint  Patient presents with  . Follow-up    follow up for low back    Samuel Cantrell is a 43 y.o. male that is presenting with ongoing altered sensation in a sock-like distribution in his lower extremities.  This is been ongoing for the past 2 months.  Denies any inciting event or trauma.  His primary care has found him to be deficient in folate and vitamin B1.  He has started the supplements for the past 2 weeks.  He has not noticed any difference in his symptoms.  He has had an elevated AST and drinks alcohol intermittently.  He reports to drinking 2-3 times per week.  Review of the ultrasound from 6/2 shows findings suggestive of hepatic steatosis.   Review of Systems  Constitutional: Negative for fever.  HENT: Negative for congestion.   Respiratory: Negative for cough.   Cardiovascular: Negative for chest pain.  Gastrointestinal: Negative for abdominal pain.  Musculoskeletal: Negative for gait problem.  Skin: Negative for color change.  Neurological: Positive for numbness.  Hematological: Negative for adenopathy.    HISTORY: Past Medical, Surgical, Social, and Family History Reviewed & Updated per EMR.   Pertinent Historical Findings include:  Past Medical History:  Diagnosis Date  . Allergy   . GERD (gastroesophageal reflux disease)   . Hypertensive urgency 02/28/2016    Past Surgical History:  Procedure Laterality Date  . NO PAST SURGERIES      No Known Allergies  Family History  Problem Relation Age of Onset  . Hypertension Mother      Social History   Socioeconomic History  . Marital status: Married    Spouse name: Not on file  . Number of children: Not on file  . Years of education: Not on file  . Highest education level: Not on file  Occupational History  . Occupation: Musician  Social Needs  . Financial resource  strain: Not on file  . Food insecurity    Worry: Not on file    Inability: Not on file  . Transportation needs    Medical: Not on file    Non-medical: Not on file  Tobacco Use  . Smoking status: Current Every Day Smoker    Packs/day: 0.50    Years: 8.00    Pack years: 4.00    Types: Cigarettes  . Smokeless tobacco: Never Used  Substance and Sexual Activity  . Alcohol use: Yes    Comment: every other night 2 beers  . Drug use: No  . Sexual activity: Yes  Lifestyle  . Physical activity    Days per week: Not on file    Minutes per session: Not on file  . Stress: Not on file  Relationships  . Social Herbalist on phone: Not on file    Gets together: Not on file    Attends religious service: Not on file    Active member of club or organization: Not on file    Attends meetings of clubs or organizations: Not on file    Relationship status: Not on file  . Intimate partner violence    Fear of current or ex partner: Not on file    Emotionally abused: Not on file    Physically abused: Not on file    Forced sexual activity: Not on file  Other Topics Concern  .  Not on file  Social History Narrative   Pt lives with wife.     PHYSICAL EXAM:  VS: BP (!) 152/107   Pulse (!) 108   Ht 5\' 7"  (1.702 m)   Wt 145 lb (65.8 kg)   BMI 22.71 kg/m  Physical Exam Gen: NAD, alert, cooperative with exam, well-appearing ENT: normal lips, normal nasal mucosa,  Eye: normal EOM, normal conjunctiva and lids CV:  no edema, +2 pedal pulses   Resp: no accessory muscle use, non-labored,  Skin: no rashes, no areas of induration  Neuro: normal tone, normal sensation to touch Psych:  normal insight, alert and oriented MSK:  Lower extremities: Normal strength resistance with hip flexion, knee flexion extension, plantarflexion and dorsiflexion. +2 deep tendon reflexes at the patella. 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   Has been on gabapentin with no improvement.  Ultrasound was suggestive of hepatic steatosis -EMG. -Can continue to vitamin B1 and folate. -Provided samples of Vimovo.

## 2018-08-26 NOTE — Addendum Note (Signed)
Addended by: Sherrie George F on: 08/26/2018 11:23 AM   Modules accepted: Orders

## 2018-08-26 NOTE — Assessment & Plan Note (Signed)
Symptoms been ongoing for 2 months.  Has been on gabapentin with no improvement.  Ultrasound was suggestive of hepatic steatosis -EMG. -Can continue to vitamin B1 and folate. -Provided samples of Vimovo.

## 2018-08-30 NOTE — Progress Notes (Signed)
Medication Samples have been provided to the patient.  Drug name: Vimovo    Strength: 500mg /20mg     Qty 3 Boxes  LOT: [id number] Exp.Date: 09/2019  Dosing instructions: take 1 tablet by mouth twice a day.  The patient has been instructed regarding the correct time, dose, and frequency of taking this medication, including desired effects and most common side effects.   Sherrie George, [place] 8:41 AM 08/26/2018

## 2018-09-04 DIAGNOSIS — F1010 Alcohol abuse, uncomplicated: Secondary | ICD-10-CM | POA: Insufficient documentation

## 2018-09-04 DIAGNOSIS — D696 Thrombocytopenia, unspecified: Secondary | ICD-10-CM | POA: Diagnosis present

## 2018-09-04 DIAGNOSIS — I10 Essential (primary) hypertension: Secondary | ICD-10-CM | POA: Diagnosis present

## 2018-09-07 ENCOUNTER — Ambulatory Visit (INDEPENDENT_AMBULATORY_CARE_PROVIDER_SITE_OTHER): Payer: Self-pay | Admitting: Medical

## 2018-09-07 ENCOUNTER — Other Ambulatory Visit: Payer: Self-pay

## 2018-09-07 VITALS — BP 144/83 | HR 91

## 2018-09-07 DIAGNOSIS — R569 Unspecified convulsions: Secondary | ICD-10-CM

## 2018-09-07 DIAGNOSIS — O26899 Other specified pregnancy related conditions, unspecified trimester: Secondary | ICD-10-CM

## 2018-09-07 DIAGNOSIS — E538 Deficiency of other specified B group vitamins: Secondary | ICD-10-CM

## 2018-09-07 DIAGNOSIS — R748 Abnormal levels of other serum enzymes: Secondary | ICD-10-CM

## 2018-09-07 DIAGNOSIS — E876 Hypokalemia: Secondary | ICD-10-CM

## 2018-09-07 DIAGNOSIS — R945 Abnormal results of liver function studies: Secondary | ICD-10-CM

## 2018-09-07 DIAGNOSIS — G629 Polyneuropathy, unspecified: Secondary | ICD-10-CM

## 2018-09-07 DIAGNOSIS — O280 Abnormal hematological finding on antenatal screening of mother: Secondary | ICD-10-CM

## 2018-09-07 DIAGNOSIS — F1010 Alcohol abuse, uncomplicated: Secondary | ICD-10-CM

## 2018-09-07 DIAGNOSIS — R7989 Other specified abnormal findings of blood chemistry: Secondary | ICD-10-CM

## 2018-09-07 DIAGNOSIS — I10 Essential (primary) hypertension: Secondary | ICD-10-CM

## 2018-09-07 DIAGNOSIS — R790 Abnormal level of blood mineral: Secondary | ICD-10-CM

## 2018-09-07 MED ORDER — ASPIRIN EC 81 MG PO TBEC
81.00 | DELAYED_RELEASE_TABLET | ORAL | Status: DC
Start: 2018-09-06 — End: 2018-09-07

## 2018-09-07 MED ORDER — LORAZEPAM 1 MG PO TABS
1.00 | ORAL_TABLET | ORAL | Status: DC
Start: ? — End: 2018-09-07

## 2018-09-07 MED ORDER — GENERIC EXTERNAL MEDICATION
100.00 | Status: DC
Start: 2018-09-06 — End: 2018-09-07

## 2018-09-07 MED ORDER — ONDANSETRON HCL 4 MG/2ML IJ SOLN
4.00 | INTRAMUSCULAR | Status: DC
Start: ? — End: 2018-09-07

## 2018-09-07 MED ORDER — CARVEDILOL 12.5 MG PO TABS
12.50 | ORAL_TABLET | ORAL | Status: DC
Start: 2018-09-05 — End: 2018-09-07

## 2018-09-07 MED ORDER — CYCLOBENZAPRINE HCL 10 MG PO TABS
5.00 | ORAL_TABLET | ORAL | Status: DC
Start: ? — End: 2018-09-07

## 2018-09-07 MED ORDER — LISINOPRIL 5 MG PO TABS: ORAL_TABLET | ORAL | 3 refills | Status: DC

## 2018-09-07 MED ORDER — FOLIC ACID 1 MG PO TABS
1.00 | ORAL_TABLET | ORAL | Status: DC
Start: 2018-09-06 — End: 2018-09-07

## 2018-09-07 MED ORDER — GABAPENTIN 100 MG PO CAPS
100.00 | ORAL_CAPSULE | ORAL | Status: DC
Start: 2018-09-05 — End: 2018-09-07

## 2018-09-07 MED ORDER — CARVEDILOL 12.5 MG PO TABS: ORAL_TABLET | ORAL | 5 refills | Status: DC

## 2018-09-07 MED ORDER — AMLODIPINE BESYLATE 2.5 MG PO TABS
2.50 | ORAL_TABLET | ORAL | Status: DC
Start: 2018-09-06 — End: 2018-09-07

## 2018-09-07 MED ORDER — GENERIC EXTERNAL MEDICATION
1.00 | Status: DC
Start: 2018-09-06 — End: 2018-09-07

## 2018-09-07 MED ORDER — BUSPIRONE HCL 7.5 MG PO TABS: 7.5000 mg | ORAL_TABLET | Freq: Two times a day (BID) | ORAL | 0 refills | Status: DC

## 2018-09-07 NOTE — Progress Notes (Signed)
Subjective:    Patient ID: Samuel Cantrell, male    DOB: May 17, 1975, 43 y.o.   MRN: [id number]  HPI  Virtual Visit via Video Note  I connected with Samuel Cantrell on 09/07/18 at  3:40 PM EDT by a video enabled telemedicine application and verified that I am speaking with the correct person using two identifiers.  Location: Patient: home Provider: office   I discussed the limitations of evaluation and management by telemedicine and the availability of in person appointments. The patient expressed understanding and agreed to proceed.  History of Present Illness: 43 year old male with history of hypertension, history of right upper extremity DVT, documented history of protein C deficiency and lupus anticoagulant positive (not on chronic anticoagulation anymore), also has history of tobacco abuse, alcohol abuse, elevated LFTs, chronic hypokalemia. Patient had a usual day yesterday and denies having any significant complaints until early this morning when his wife heard him crying out and went to check on him. Patient was apparently laying on the sofa and wife saw that he fell off the sofa and was having seizure-like activity and then he stopped breathing. Wife panicked and started doing CPR and called ambulance. By the time EMS got there, patient's seizures subsided. He was drowsy. Immediately brought over to the ER.  Here in the ER, during ER MDs initial evaluation, patient again started to have tonic-clonic activity and full-blown seizure. He received 2 mg of IV Ativan. Patient was found to be tachycardic, rest of the vital signs were stable. Routine lab work was obtained and is significant for hypokalemia, thrombocytopenia, elevated LFTs, UDS is negative. Hyperglycemia is also noted. EKG shows normal sinus rhythm with rate of 96 bpm with nonspecific changes and mildly prolonged QTC. CT of the head is nonacute. Admission is requested for further evaluation and management of new onset  seizures. By the time of my exam, patient is now more alert, oriented, afebrile. He was drowsy earlier. He is now resting comfortably. Denies having any pain. Has never had any history of seizures in the past. He does admit to history of alcohol abuse and apparently was drinking 2-3 shots of hard liquor every day for a long time but on Thursday he decided to quit and did not drink any for the last 1 to 2 days.  He denies having any headaches. No nausea or vomiting. No fevers, cough or congestion symptoms. No recent sick contacts. Denies having any chest discomfort. No abdominal pain, no diarrhea or dysuria. Patient does have history of upper extremity DVT and hypercoagulable state for which he was on Xarelto but he says he was taken off of it and currently only taking aspirin.  IMPRESSION:  1. No acute findings. No intracranial mass, hemorrhage or edema.  2. Atrophy, moderate in degree for age.   Pt reports no hx of seizures before this prior event. Since discharge from hospital he has not drank any alcohol.  He has was discharged on thiamine and potassium.     Pt has history of gabpantin for neuropathy. He has known b12, b1 and low magnesium. Sports medicine MD determined he does not have lower back cause of lower ext neuropathy. Made pt aware today will recheck labs next week. If cmp, mg , b12, b1 and k all normal will go ahead and refer to neurologist.    Review of Systems  Constitutional: Negative for chills, diaphoresis, fatigue and fever.  HENT: Negative for congestion, ear discharge and ear pain.   Eyes: Negative  for pain.  Respiratory: Negative for cough, chest tightness, shortness of breath and wheezing.   Cardiovascular: Negative for chest pain and palpitations.  Gastrointestinal: Negative for abdominal pain.  Musculoskeletal: Negative for back pain, myalgias and neck pain.  Skin: Negative for rash.  Neurological: Negative for dizziness, syncope, facial asymmetry, weakness  and numbness.       Neuropathy of lower ext.  Hematological: Negative for adenopathy. Does not bruise/bleed easily.  Psychiatric/Behavioral: Negative for behavioral problems, confusion, decreased concentration, dysphoric mood and suicidal ideas. The patient is nervous/anxious.        Possible anxiety per wife.   Past Medical History:  Diagnosis Date  . Allergy   . GERD (gastroesophageal reflux disease)   . Hypertensive urgency 02/28/2016     Social History   Socioeconomic History  . Marital status: Married    Spouse name: Not on file  . Number of children: Not on file  . Years of education: Not on file  . Highest education level: Not on file  Occupational History  . Occupation: AstronomerUtility Locator  Social Needs  . Financial resource strain: Not on file  . Food insecurity    Worry: Not on file    Inability: Not on file  . Transportation needs    Medical: Not on file    Non-medical: Not on file  Tobacco Use  . Smoking status: Current Every Day Smoker    Packs/day: 0.50    Years: 8.00    Pack years: 4.00    Types: Cigarettes  . Smokeless tobacco: Never Used  Substance and Sexual Activity  . Alcohol use: Yes    Comment: every other night 2 beers  . Drug use: No  . Sexual activity: Yes  Lifestyle  . Physical activity    Days per week: Not on file    Minutes per session: Not on file  . Stress: Not on file  Relationships  . Social Musicianconnections    Talks on phone: Not on file    Gets together: Not on file    Attends religious service: Not on file    Active member of club or organization: Not on file    Attends meetings of clubs or organizations: Not on file    Relationship status: Not on file  . Intimate partner violence    Fear of current or ex partner: Not on file    Emotionally abused: Not on file    Physically abused: Not on file    Forced sexual activity: Not on file  Other Topics Concern  . Not on file  Social History Narrative   Pt lives with wife.    Past  Surgical History:  Procedure Laterality Date  . NO PAST SURGERIES      Family History  Problem Relation Age of Onset  . Hypertension Mother     No Known Allergies  Current Outpatient Medications on File Prior to Visit  Medication Sig Dispense Refill  . amLODipine (NORVASC) 2.5 MG tablet Take 1 tablet (2.5 mg total) by mouth daily. 30 tablet 0  . cyclobenzaprine (FLEXERIL) 5 MG tablet Take 1 tablet (5 mg total) by mouth at bedtime. 14 tablet 0  . diphenhydrAMINE (BENADRYL) 25 MG tablet Take 25 mg by mouth every 6 (six) hours as needed for itching or allergies.    . fluticasone (FLONASE) 50 MCG/ACT nasal spray SHAKE LIQUID AND USE 2 SPRAYS IN EACH NOSTRIL DAILY 16 g 0  . folic acid (FOLVITE) 1 MG tablet Take 1  tablet (1 mg total) by mouth daily. 30 tablet 0  . gabapentin (NEURONTIN) 100 MG capsule Take 1 capsule (100 mg total) by mouth at bedtime. 30 capsule 0  . ibuprofen (ADVIL,MOTRIN) 200 MG tablet Take by mouth.    . potassium chloride (K-DUR) 10 MEQ tablet Take 1 tablet (10 mEq total) by mouth daily. 30 tablet 0  . potassium chloride (K-DUR,KLOR-CON) 10 MEQ tablet TAKE 1 TABLET BY MOUTH DAILY FOR 7 DAYS, THEN TAKE 1 TABLET EVERY OTHER DAY 30 tablet 0  . predniSONE (DELTASONE) 5 MG tablet Take 6 pills for first day, 5 pills second day, 4 pills third day, 3 pills fourth day, 2 pills the fifth day, and 1 pill sixth day. 21 tablet 0   No current facility-administered medications on file prior to visit.     BP (!) 144/83   Pulse 91       Objective:   Physical Exam  General-no acute distress, pleasant, oriented. Lungs- on inspection lungs appear unlabored. Neck- no tracheal deviation or jvd on inspection. Neuro- gross motor function appears intact.      Assessment & Plan:   For alcohol abuse counseled to continue abstinence. You and wife indicate possible anxiety and concern for self treatment. I want you to start buspar as way to help you treating anxiety. If you continue  to drink let me know will try to get you contact with services such as AA to help you stop going forward.  For seizures, your ct of head was negative. I do think cause was alcohol withdrawal. If you have recurrent seizures will refer you to neurologist for further work up/eeg.  For lower ext neuropathy, take your k tabs, thiamine and b12. Next week repeat labs. If level normal but neuropathy persist will refer you to neurologist.  For htn refilled bp meds today.  Follow up in 2 weeks or as needed  Esperanza RichtersEdward Kaveh Kissinger, PA-C   Follow Up Instructions:    I discussed the assessment and treatment plan with the patient. The patient was provided an opportunity to ask questions and all were answered. The patient agreed with the plan and demonstrated an understanding of the instructions.   The patient was advised to call back or seek an in-person evaluation if the symptoms worsen or if the condition fails to improve as anticipated.  I provided 25 +minutes of non-face-to-face time during this encounter.   Esperanza RichtersEdward Alonzo Loving, PA-C

## 2018-09-07 NOTE — Patient Instructions (Addendum)
For alcohol abuse counseled to continue abstinence. You and wife indicate possible anxiety and concern for self treatment. I want you to start buspar as way to help you treating anxiety. If you continue to drink let me know will try to get you contact with services such as AA to help you stop going forward.  For seizures, your ct of head was negative. I do think cause was alcohol withdrawal. If you have recurrent seizures will refer you to neurologist for further work up/eeg.  For lower ext neuropathy, take your k tabs, thiamine and b12. Next week repeat labs. If level normal but neuropathy persist will refer you to neurologist.  For htn refilled bp meds today.  Follow up in 2 weeks or as needed

## 2018-09-09 ENCOUNTER — Other Ambulatory Visit: Payer: Self-pay | Admitting: Medical

## 2018-09-09 MED ORDER — GABAPENTIN 100 MG PO CAPS: 100.0000 mg | ORAL_CAPSULE | Freq: Every day | ORAL | 3 refills | Status: DC

## 2018-09-09 NOTE — Telephone Encounter (Signed)
Refills sent

## 2018-09-09 NOTE — Telephone Encounter (Signed)
Medication:  gabapentin (NEURONTIN) 100 MG capsule   Patient is requesting refill of this medication.    Pharmacy:  [facility] [place], [place] AT [address] (Phone) [phone] (Fax

## 2018-09-14 ENCOUNTER — Telehealth: Payer: Self-pay | Admitting: Medical

## 2018-09-14 MED ORDER — LISINOPRIL 2.5 MG PO TABS: ORAL_TABLET | ORAL | 2 refills | Status: DC

## 2018-09-14 NOTE — Telephone Encounter (Signed)
Rx lisinopriol 2.5 mg dose 2 tab po q day rx sent to pt pharmacy.

## 2018-09-14 NOTE — Telephone Encounter (Signed)
Medication Refill - Medication: lisinopril (ZESTRIL) 5 MG tablet / Pharmacy stated that lisinopril 5MG  is on backorder until August. They do have 2.5MG  in stock but will need new rx sent in or an alternative medication. Please advise.  Has the patient contacted their pharmacy? Yes.   (Agent: If no, request that the patient contact the pharmacy for the refill.) (Agent: If yes, when and what did the pharmacy advise?)  Preferred Pharmacy (with phone number or street name): [facility] #[id number], [place] [phone] (Phone) [phone] (Fax)     Agent: Please be advised that RX refills may take up to 3 business days. We ask that you follow-up with your pharmacy.

## 2018-09-14 NOTE — Telephone Encounter (Signed)
Please advise 

## 2018-09-19 ENCOUNTER — Emergency Department (HOSPITAL_BASED_OUTPATIENT_CLINIC_OR_DEPARTMENT_OTHER)
Admission: EM | Admit: 2018-09-19 | Discharge: 2018-09-19 | Disposition: A | Discharge status: Home / Self Care | Payer: Self-pay | Attending: Emergency Medicine | Admitting: Emergency Medicine

## 2018-09-19 ENCOUNTER — Encounter (HOSPITAL_BASED_OUTPATIENT_CLINIC_OR_DEPARTMENT_OTHER): Payer: Self-pay | Admitting: Emergency Medicine

## 2018-09-19 ENCOUNTER — Other Ambulatory Visit: Payer: Self-pay

## 2018-09-19 DIAGNOSIS — Z79899 Other long term (current) drug therapy: Secondary | ICD-10-CM | POA: Insufficient documentation

## 2018-09-19 DIAGNOSIS — I10 Essential (primary) hypertension: Secondary | ICD-10-CM | POA: Insufficient documentation

## 2018-09-19 DIAGNOSIS — F17210 Nicotine dependence, cigarettes, uncomplicated: Secondary | ICD-10-CM | POA: Insufficient documentation

## 2018-09-19 DIAGNOSIS — E860 Dehydration: Secondary | ICD-10-CM | POA: Insufficient documentation

## 2018-09-19 DIAGNOSIS — R55 Syncope and collapse: Secondary | ICD-10-CM | POA: Insufficient documentation

## 2018-09-19 LAB — HEPATIC FUNCTION PANEL
ALT: 36 U/L (ref 0–44)
AST: 57 U/L — ABNORMAL HIGH (ref 15–41)
Albumin: 3.8 g/dL (ref 3.5–5.0)
Alkaline Phosphatase: 51 U/L (ref 38–126)
Bilirubin, Direct: <0.1 mg/dL (ref 0.0–0.2)
Total Bilirubin: 0.7 mg/dL (ref 0.3–1.2)
Total Protein: 7.3 g/dL (ref 6.5–8.1)

## 2018-09-19 LAB — BASIC METABOLIC PANEL
Anion gap: 14 (ref 5–15)
BUN: 11 mg/dL (ref 6–20)
CO2: 22 mmol/L (ref 22–32)
Calcium: 8.4 mg/dL — ABNORMAL LOW (ref 8.9–10.3)
Chloride: 96 mmol/L — ABNORMAL LOW (ref 98–111)
Creatinine, Ser: 0.93 mg/dL (ref 0.61–1.24)
GFR calc Af Amer: >60 mL/min (ref >60)
GFR calc non Af Amer: >60 mL/min (ref >60)
Glucose, Bld: 115 mg/dL — ABNORMAL HIGH (ref 70–99)
Potassium: 4.9 mmol/L (ref 3.5–5.1)
Sodium: 132 mmol/L — ABNORMAL LOW (ref 135–145)

## 2018-09-19 LAB — CBC
HCT: 42.2 % (ref 39.0–52.0)
Hemoglobin: 14.4 g/dL (ref 13.0–17.0)
MCH: 31.4 pg (ref 26.0–34.0)
MCHC: 34.1 g/dL (ref 30.0–36.0)
MCV: 91.9 fL (ref 80.0–100.0)
Platelets: 347 10*3/uL (ref 150–400)
RBC: 4.59 MIL/uL (ref 4.22–5.81)
RDW: 13.8 % (ref 11.5–15.5)
WBC: 5.5 10*3/uL (ref 4.0–10.5)
nRBC: 0 % (ref 0.0–0.2)

## 2018-09-19 LAB — MAGNESIUM: Magnesium: 2 mg/dL (ref 1.7–2.4)

## 2018-09-19 MED ORDER — SODIUM CHLORIDE 0.9 % IV BOLUS
1000.0000 mL | Freq: Once | INTRAVENOUS | Status: AC
  Administered 2018-09-19: 500 mL via INTRAVENOUS

## 2018-09-19 NOTE — ED Provider Notes (Signed)
Assumed care of patient at 3 PM.  Patient here for lightheadedness.  Likely due to dehydration.  IV fluids have been given.  Lab work shows no significant anemia, electrolyte abnormality, kidney injury.  Patient feels much improved after IV fluids.  Asymptomatic.  Normal neurological exam.  Suspect likely orthostatics, dehydration.  Recommend close follow-up with PCP.  Told patient to return to ED if symptoms worsen.  This chart was dictated using voice recognition software.  Despite best efforts to proofread,  errors can occur which can change the documentation meaning.     Lennice Sites, DO 09/19/18 1702

## 2018-09-19 NOTE — ED Triage Notes (Signed)
Pt states that he "passed out" after feeling dizzy approx 30 mins. Pta. Pt verbalized taking b/p medication this morning. Denies headache, states he "saw sparkles in my eyes" Denies dizziness at this time.

## 2018-09-19 NOTE — ED Provider Notes (Signed)
[facility] EMERGENCY DEPARTMENT Provider Note   CSN: [id number] Arrival date & time: 09/19/18  1344    History   Chief Complaint Chief Complaint  Patient presents with  . Loss of Consciousness   [id number] HPI Caffie DammeSharif L Legner is a 43 y.o. male.     HPI   Began feeling lightheaded while at grocery store, drove home then went to the bathroom and when he exited the lightheadedness worsened and he passed out. His wife caught him, did not hurt anything in the fall.  Took blood pressure medicine this AM, no changes Felt ok this morning just started suddenly while shopping.   No chest pain or shortness of breath, nopalpitations Vomited on the way here, but otherwise no symptoms of vomiting, nausea No diarrhea recently, no black or bloody stools Has been eating and drinking normally, except last night went to bed without eating and drank etoh again. No fevers, no urinary symptoms, no cough, no abdominal pain or headache  No other changes in medicine Was previously on blood thinners for DVT but no longer on it, now on aspirin, was 2018 Not sure why blood clot  Smoking cigarettes, drinks 2-3 beers a day, no other drugs  One month ago did pass out also after a bowel movement  Was admitted June 27 2 [facility] with concern for seizures likely secondary to alcohol withdrawal  Past Medical History:  Diagnosis Date  . Allergy   . GERD (gastroesophageal reflux disease)   . Hypertensive urgency 02/28/2016    Patient Active Problem List   Diagnosis Date Noted  . Polyneuropathy 07/29/2018  . Acute deep vein thrombosis (DVT) of brachial vein of right upper extremity (HCC) 11/25/2016  . Elevated troponin 02/28/2016  . Hypertensive urgency 02/28/2016  . Tobacco abuse 02/28/2016  . Acute hypokalemia 02/28/2016  . Wellness examination 05/02/2015  . Allergic rhinitis 04/10/2015  . Smoker 04/10/2015    Past Surgical History:  Procedure Laterality Date  . NO PAST  SURGERIES          Home Medications    Prior to Admission medications   Medication Sig Start Date End Date Taking? Authorizing Provider  amLODipine (NORVASC) 2.5 MG tablet Take 1 tablet (2.5 mg total) by mouth daily. 03/25/18   Saguier, Ramon DredgeEdward, PA-C  busPIRone (BUSPAR) 7.5 MG tablet Take 1 tablet (7.5 mg total) by mouth 2 (two) times daily. 09/07/18   Saguier, Ramon DredgeEdward, PA-C  carvedilol (COREG) 12.5 MG tablet TAKE 1 TABLET(12.5 MG) BY MOUTH TWICE DAILY WITH A MEAL 09/07/18   Saguier, Ramon DredgeEdward, PA-C  cyclobenzaprine (FLEXERIL) 5 MG tablet Take 1 tablet (5 mg total) by mouth at bedtime. 07/12/18   Saguier, Ramon DredgeEdward, PA-C  diphenhydrAMINE (BENADRYL) 25 MG tablet Take 25 mg by mouth every 6 (six) hours as needed for itching or allergies.    [provider]  fluticasone (FLONASE) 50 MCG/ACT nasal spray SHAKE LIQUID AND USE 2 SPRAYS [facility] NO[facility]IL DAILY 08/31/17   Saguier, Ramon DredgeEdward, PA-C  folic acid (FOLVITE) 1 MG tablet Take 1 tablet (1 mg total) by mouth daily. 08/10/18   Saguier, Ramon DredgeEdward, PA-C  gabapentin (NEURONTIN) 100 MG capsule Take 1 capsule (100 mg total) by mouth at bedtime. 09/09/18   Saguier, Ramon DredgeEdward, PA-C  ibuprofen (ADVIL,MOTRIN) 200 MG tablet Take by mouth.    [provider]  lisinopril (ZESTRIL) 2.5 MG tablet 2 tab po q day 09/14/18   Saguier, Ramon DredgeEdward, PA-C  potassium chloride (K-DUR) 10 MEQ tablet Take 1 tablet (10  mEq total) by mouth daily. 08/10/18   Saguier, Percell Miller, PA-C  potassium chloride (K-DUR,KLOR-CON) 10 MEQ tablet TAKE 1 TABLET BY MOUTH DAILY FOR 7 DAYS, THEN TAKE 1 TABLET EVERY OTHER DAY 03/31/17   Saguier, Percell Miller, PA-C  predniSONE (DELTASONE) 5 MG tablet Take 6 pills for first day, 5 pills second day, 4 pills third day, 3 pills fourth day, 2 pills the fifth day, and 1 pill sixth day. 07/29/18   Rosemarie Ax, MD    Family History Family History  Problem Relation Age of Onset  . Hypertension Mother     Social History Social History   Tobacco Use  . Smoking  status: Current Every Day Smoker    Packs/day: 0.50    Years: 8.00    Pack years: 4.00    Types: Cigarettes  . Smokeless tobacco: Never Used  Substance Use Topics  . Alcohol use: Yes    Comment: every other night 2 beers  . Drug use: No     Allergies   Patient has no known allergies.   Review of Systems Review of Systems  Constitutional: Negative for fever.  HENT: Negative for sore throat.   Eyes: Negative for visual disturbance.  Respiratory: Negative for cough and shortness of breath.   Cardiovascular: Negative for chest pain.  Gastrointestinal: Negative for abdominal pain, blood in stool, diarrhea, nausea and vomiting.  Genitourinary: Negative for difficulty urinating and dysuria.  Musculoskeletal: Negative for back pain.  Skin: Negative for rash.  Neurological: Positive for syncope and light-headedness. Negative for seizures, weakness, numbness and headaches.     Physical Exam Updated Vital Signs BP 132/86   Pulse 77   Temp 97.7 F (36.5 C) (Oral)   Resp 14   Ht 5\' 7"  (1.702 m)   Wt 63.5 kg   SpO2 100%   BMI 21.93 kg/m   Physical Exam Vitals signs and nursing note reviewed.  Constitutional:      General: He is not in acute distress.    Appearance: He is well-developed. He is not diaphoretic.  HENT:     Head: Normocephalic and atraumatic.  Eyes:     Conjunctiva/sclera: Conjunctivae normal.  Neck:     Musculoskeletal: Normal range of motion.  Cardiovascular:     Rate and Rhythm: Normal rate and regular rhythm.     Heart sounds: Normal heart sounds. No murmur. No friction rub. No gallop.   Pulmonary:     Effort: Pulmonary effort is normal. No respiratory distress.     Breath sounds: Normal breath sounds. No wheezing or rales.  Abdominal:     General: There is no distension.     Palpations: Abdomen is soft.     Tenderness: There is no abdominal tenderness. There is no guarding.  Skin:    General: Skin is warm and dry.  Neurological:     Mental  Status: He is alert and oriented to person, place, and time.      ED Treatments / Results  Labs (all labs ordered are listed, but only abnormal results are displayed) Labs Reviewed  BASIC METABOLIC PANEL - Abnormal; Notable for the following components:      Result Value   Sodium 132 (*)    Chloride 96 (*)    Glucose, Bld 115 (*)    Calcium 8.4 (*)    All other components within normal limits  HEPATIC FUNCTION PANEL - Abnormal; Notable for the following components:   AST 57 (*)    All other components  within normal limits  CBC  MAGNESIUM    EKG EKG Interpretation  Date/Time:  Sunday September 19 2018 14:02:37 EDT Ventricular Rate:  85 PR Interval:  158 QRS Duration: 81 QT Interval:  387 QTC Calculation: 461 R Axis:   81 Text Interpretation:  Sinus rhythm ST elev, probable normal early repol pattern Confirmed by Adam, Curatolo ([id number]) on 09/19/2018 3:27:36 PM   Radiology No results found.  Procedures Procedures (including critical care time)  Medications Ordered in ED Medications  sodium chloride 0.9 % bolus 1,000 mL (0 mLs Intravenous Stopped 09/19/18 1515)     Initial Impression / Assessment and Plan / ED Course  I have reviewed the triage vital signs and the nursing notes.  Pertinent labs & imaging results that were available during my care of the patient were reviewed by me and considered in my medical decision making (see chart for details).        43  year old male with a history of DVT in 2018 now on aspirin therapy, hypertension, smoking, alcohol dependence with recent admission with concern for alcohol withdrawal seizures, presents with concern for syncope.  Initial EKG with elevation in V3, however no other areas of elevation, and appearance does not appear consistent with a Brugada pattern.  Repeat EKG is similar to prior, with findings most consistent with benign early repolarization, suspect findings on previous EKG were artifactual.  No chest pain, no  dyspnea, doubt ACS or PE.  History not consistent today with seizure.  Denies black or bloody stool. Labs pending.  By history suspect dehydration with patient drinking etoh last night and falling asleep before dinner.  Giving IV fluids. Signed out to Dr. Lockie Molauratolo with labs and reeval pending.    Final Clinical Impressions(s) / ED Diagnoses   Final diagnoses:  Dehydration  Syncope, unspecified syncope type    ED Discharge Orders    None       Alvira MondaySchlossman, Amayrani Bennick, MD 09/21/18 1056

## 2018-09-22 ENCOUNTER — Other Ambulatory Visit: Payer: Self-pay | Admitting: Medical

## 2018-10-27 ENCOUNTER — Encounter: Payer: Self-pay | Admitting: Medical

## 2018-10-27 ENCOUNTER — Ambulatory Visit (INDEPENDENT_AMBULATORY_CARE_PROVIDER_SITE_OTHER): Payer: Self-pay | Admitting: Medical

## 2018-10-27 ENCOUNTER — Other Ambulatory Visit: Payer: Self-pay

## 2018-10-27 VITALS — BP 144/97 | HR 89

## 2018-10-27 DIAGNOSIS — R55 Syncope and collapse: Secondary | ICD-10-CM

## 2018-10-27 DIAGNOSIS — G629 Polyneuropathy, unspecified: Secondary | ICD-10-CM

## 2018-10-27 DIAGNOSIS — I10 Essential (primary) hypertension: Secondary | ICD-10-CM

## 2018-10-27 DIAGNOSIS — L089 Local infection of the skin and subcutaneous tissue, unspecified: Secondary | ICD-10-CM

## 2018-10-27 MED ORDER — DOXYCYCLINE HYCLATE 100 MG PO TABS: 100.0000 mg | ORAL_TABLET | Freq: Two times a day (BID) | ORAL | 0 refills | Status: DC

## 2018-10-27 MED ORDER — GABAPENTIN 100 MG PO CAPS: 100.0000 mg | ORAL_CAPSULE | Freq: Three times a day (TID) | ORAL | 1 refills | Status: DC

## 2018-10-27 NOTE — Patient Instructions (Addendum)
You do have persisting lower extremity neuropathy despite use of the b-1, folic acid and gabapentin.  I expect you to have some improvement with these treatments.  Glad to hear that you stop drinking alcohol completely.  At this point I do think is a good idea to refer you to neurologist as you do have history of some lower back pain and getting EMG studies might be beneficial.  Though also likely explain your blood work panel as well.  Continue with B1, folic acid and will increase dose of gabapentin.   You had syncopal episode this summer in the emergency department thought this was related to dehydration.  Seizures is part of differential for syncopal episodes of also making note on referral to neurologist as they might decide to get a EEG.  He has history of hypertension with occasional intermittent episodes of low blood pressure.  You already started lisinopril and note improved blood pressure that runs a little bit high.  Recommend continue with just carvedilol but if you have blood pressures below 161/09 or any systolics above 604 then let me know.   You do appear to have probable left elbow region skin infection/cellulitis.  I went ahead and send in doxycycline antibiotic.  Rx advisement given.  Follow-up this coming Friday to evaluate elbow area.  If his bicep region is swollen we will go ahead and repeat ultrasound since he does have a history of upper extremity DVT in the past.  But on video inspection it appeared that the red area was just over his elbow.

## 2018-10-27 NOTE — Progress Notes (Signed)
Subjective:    Patient ID: Samuel Cantrell, male    DOB: 03/19/1975, 43 y.o.   MRN: [id number]  HPI    Below " is from last visit.  "For alcohol abuse counseled to continue abstinence. You and wife indicate possible anxiety and concern for self treatment. I want you to start buspar as way to help you treating anxiety. If you continue to drink let me know will try to get you contact with services such as AA to help you stop going forward.  For seizures, your ct of head was negative. I do think cause was alcohol withdrawal. If you have recurrent seizures will refer you to neurologist for further work up/eeg.  For lower ext neuropathy, take your k tabs, thiamine and b12. Next week repeat labs. If level normal but neuropathy persist will refer you to neurologist.  For htn refilled bp meds today."   Pt states his neuropathy type symptoms area same despite b1, folate and gabapentin. He is having some balance issues due to severity. Pt stopped drinking all together.   Pt states not having to use buspar for anxiety.  Pt has some blood pressure variability. At times his blood pressure drops on standing. He states occasionally will get bp drop to 90/50. But then comes back up. Pt has only been on carvediol. He stopped amlodipine.   Pt states left elbow mild red, warm and tender since last week. No fever.    Review of Systems  Constitutional: Negative for chills, fatigue and fever.  Eyes: Negative for pain and visual disturbance.  Respiratory: Negative for chest tightness, shortness of breath and wheezing.   Cardiovascular: Negative for chest pain and palpitations.  Gastrointestinal: Negative for abdominal pain, blood in stool, constipation and rectal pain.  Musculoskeletal: Negative for back pain, myalgias and neck stiffness.  Skin: Negative for rash.  Neurological: Negative for dizziness, syncope, weakness and numbness.  Hematological: Negative for adenopathy. Does not  bruise/bleed easily.  Psychiatric/Behavioral: Negative for behavioral problems, confusion, dysphoric mood and sleep disturbance.    Past Medical History:  Diagnosis Date  . Allergy   . GERD (gastroesophageal reflux disease)   . Hypertensive urgency 02/28/2016     Social History   Socioeconomic History  . Marital status: Married    Spouse name: Not on file  . Number of children: Not on file  . Years of education: Not on file  . Highest education level: Not on file  Occupational History  . Occupation: Musician  Social Needs  . Financial resource strain: Not on file  . Food insecurity    Worry: Not on file    Inability: Not on file  . Transportation needs    Medical: Not on file    Non-medical: Not on file  Tobacco Use  . Smoking status: Current Every Day Smoker    Packs/day: 0.50    Years: 8.00    Pack years: 4.00    Types: Cigarettes  . Smokeless tobacco: Never Used  Substance and Sexual Activity  . Alcohol use: Yes    Comment: every other night 2 beers  . Drug use: No  . Sexual activity: Yes  Lifestyle  . Physical activity    Days per week: Not on file    Minutes per session: Not on file  . Stress: Not on file  Relationships  . Social Herbalist on phone: Not on file    Gets together: Not on file    Attends  religious service: Not on file    Active member of club or organization: Not on file    Attends meetings of clubs or organizations: Not on file    Relationship status: Not on file  . Intimate partner violence    Fear of current or ex partner: Not on file    Emotionally abused: Not on file    Physically abused: Not on file    Forced sexual activity: Not on file  Other Topics Concern  . Not on file  Social History Narrative   Pt lives with wife.    Past Surgical History:  Procedure Laterality Date  . NO PAST SURGERIES      Family History  Problem Relation Age of Onset  . Hypertension Mother     No Known Allergies  Current  Outpatient Medications on File Prior to Visit  Medication Sig Dispense Refill  . amLODipine (NORVASC) 2.5 MG tablet Take 1 tablet (2.5 mg total) by mouth daily. 30 tablet 0  . busPIRone (BUSPAR) 7.5 MG tablet Take 1 tablet (7.5 mg total) by mouth 2 (two) times daily. 60 tablet 0  . carvedilol (COREG) 12.5 MG tablet TAKE 1 TABLET(12.5 MG) BY MOUTH TWICE DAILY WITH A MEAL 60 tablet 5  . cyclobenzaprine (FLEXERIL) 5 MG tablet Take 1 tablet (5 mg total) by mouth at bedtime. 14 tablet 0  . diphenhydrAMINE (BENADRYL) 25 MG tablet Take 25 mg by mouth every 6 (six) hours as needed for itching or allergies.    . fluticasone (FLONASE) 50 MCG/ACT nasal spray SHAKE LIQUID AND USE 2 SPRAYS IN EACH NOSTRIL DAILY 16 g 0  . folic acid (FOLVITE) 1 MG tablet TAKE 1 TABLET BY MOUTH EVERY DAY 90 tablet 0  . ibuprofen (ADVIL,MOTRIN) 200 MG tablet Take by mouth.    [name] Kitchen. lisinopril (ZESTRIL) 2.5 MG tablet 2 tab po q day 60 tablet 2  . potassium chloride (K-DUR) 10 MEQ tablet Take 1 tablet (10 mEq total) by mouth daily. 30 tablet 0  . potassium chloride (K-DUR,KLOR-CON) 10 MEQ tablet TAKE 1 TABLET BY MOUTH DAILY FOR 7 DAYS, THEN TAKE 1 TABLET EVERY OTHER DAY 30 tablet 0  . predniSONE (DELTASONE) 5 MG tablet Take 6 pills for first day, 5 pills second day, 4 pills third day, 3 pills fourth day, 2 pills the fifth day, and 1 pill sixth day. 21 tablet 0   No current facility-administered medications on file prior to visit.     BP (!) 144/97   Pulse 89        Objective:   Physical Exam  General-no acute distress, pleasant, oriented. Lungs- on inspection lungs appear unlabored. Neck- no tracheal deviation or jvd on inspection. Neuro- gross motor function appears intact.  Left upper ext- small red area and warm.         Assessment & Plan:  You do have persisting lower extremity neuropathy despite use of the b-1, folic acid and gabapentin.  I expect you to have some improvement with these treatments.  Glad to  hear that you stop drinking alcohol completely.  At this point I do think is a good idea to refer you to neurologist as you do have history of some lower back pain and getting EMG studies might be beneficial.  Though also likely explain your blood work panel as well.  Continue with B1, folic acid and will increase dose of gabapentin.   You had syncopal episode this summer in the emergency department thought this was related  to dehydration.  Seizures is part of differential for syncopal episodes of also making note on referral to neurologist as they might decide to get a EEG.  He has history of hypertension with occasional intermittent episodes of low blood pressure.  You already started lisinopril and note improved blood pressure that runs a little bit high.  Recommend continue with just carvedilol but if you have blood pressures below 105/70 or any systolics above 160 then let me know.   You do appear to have probable left elbow region skin infection/cellulitis.  I went ahead and send in doxycycline antibiotic.  Rx advisement given.  Follow-up this coming Friday to evaluate elbow area.  If his bicep region is swollen we will go ahead and repeat ultrasound since he does have a history of upper extremity DVT in the past.  But on video inspection it appeared that the red area was just over his elbow.    25+ minutes spent with pt discussing tx and dx. Answered pt questions.  Esperanza RichtersEdward Jaylon Grode, PA-C

## 2018-10-29 ENCOUNTER — Other Ambulatory Visit: Payer: Self-pay

## 2018-10-29 ENCOUNTER — Telehealth: Payer: Self-pay | Admitting: Medical

## 2018-10-29 ENCOUNTER — Ambulatory Visit (HOSPITAL_BASED_OUTPATIENT_CLINIC_OR_DEPARTMENT_OTHER)
Admission: RE | Admit: 2018-10-29 | Discharge: 2018-10-29 | Disposition: A | Discharge status: Home / Self Care | Payer: Self-pay | Source: Ambulatory Visit | Attending: Medical | Admitting: Medical

## 2018-10-29 ENCOUNTER — Ambulatory Visit (INDEPENDENT_AMBULATORY_CARE_PROVIDER_SITE_OTHER): Payer: Self-pay | Admitting: Medical

## 2018-10-29 VITALS — BP 133/84 | HR 89 | Temp 98.0°F | Resp 16 | Ht 67.0 in | Wt 120.6 lb

## 2018-10-29 DIAGNOSIS — I82622 Acute embolism and thrombosis of deep veins of left upper extremity: Secondary | ICD-10-CM

## 2018-10-29 DIAGNOSIS — M79602 Pain in left arm: Secondary | ICD-10-CM | POA: Insufficient documentation

## 2018-10-29 NOTE — Patient Instructions (Addendum)
By recent video visit 2 days ago I had thought you probably had early skin infection as there was a redness to your left elbow area on video inspection.  However based on your history of upper extremity DVT in the past, I did want you to come in today for further evaluation.  On inspection and palpation there does seem to be a little bit prominent tender superficial blood vessels and you do have history of protein C deficiency.  So I placed left upper extremity ultrasound order and want you to get that done later today or at the latest tomorrow morning.  If study comes back negative then would recommend that you go ahead and start antibiotic as prescribed other day.  If that study comes back positive for DVT then will prescribe appropriate treatment and eventually get you back in with Dr. Marin Olp.  Follow-up date to be determined after imaging review.(study at 1:15)

## 2018-10-29 NOTE — Telephone Encounter (Signed)
Referral to Dr. Ennever placed. 

## 2018-10-29 NOTE — Progress Notes (Signed)
   Subjective:    Patient ID: Samuel Cantrell, male    DOB: [birth year], 43 y.o.   MRN: [id number]  HPI  Pt in for follow up.  See that note   Pt left upper extremity is minimally faintly tender. Less than before/2 days ago. No redness. I had wanted him to start antibiotic then assess response.   He does have hx of protein C deficiency and he did have clot on rt side.   Pt has no chest pain or sob.   Review of Systems     Objective:   Physical Exam    General- No acute distress. Pleasant patient. Neck- Full range of motion, no jvd Lungs- Clear, even and unlabored. Heart- regular rate and rhythm. Neurologic- CNII- XII grossly intact. Left upper arm-bicep not swollen. But medial aspect around elbow vein prominent. Mild tender and mild indurated. No skin redness.     Assessment & Plan:  By recent video visit 2 days ago I had thought you probably had early skin infection as there was a redness to your left elbow area on video inspection.  However based on your history of upper extremity DVT in the past, I did want you to come in today for further evaluation.  On inspection and palpation there does seem to be a little bit prominent tender superficial blood vessels and you do have history of protein C deficiency.  So I placed left upper extremity ultrasound order and want you to get that done later today or at the latest tomorrow morning.  If study comes back negative then would recommend that you go ahead and start antibiotic as prescribed other day.  If that study comes back positive for DVT then will prescribe appropriate treatment and eventually get you back in with Dr. Marin Olp.  Follow-up date to be determined after imaging review.(study at 4:15)  Call back number [phone]  After hours [place] came back + for dvt. Gave zarelto starter pack. Pt has 20 mg tab to start those after he finishes 30 day starter pack. Will refer pt back to Dr. Marin Olp as well. Pt starting xarelto  tonight. Will also ask he follow up by telephone visit in 2 weeks.  25 + time spent with pt today. 50% of time spent counseling on time going forward. Time spent with pt during office and additional time spent after hours as well.  Samuel Pai, PA-C

## 2018-11-02 ENCOUNTER — Telehealth: Payer: Self-pay | Admitting: Hematology & Oncology

## 2018-11-02 NOTE — Telephone Encounter (Signed)
Spoke with patient to confirm Sept 10 appt at 130pm

## 2018-11-10 ENCOUNTER — Encounter: Payer: Self-pay | Admitting: Neurology

## 2018-11-11 ENCOUNTER — Other Ambulatory Visit: Payer: Self-pay

## 2018-11-11 ENCOUNTER — Telehealth (INDEPENDENT_AMBULATORY_CARE_PROVIDER_SITE_OTHER): Payer: Self-pay | Admitting: Neurology

## 2018-11-11 VITALS — Ht 68.0 in | Wt 130.0 lb

## 2018-11-11 DIAGNOSIS — G629 Polyneuropathy, unspecified: Secondary | ICD-10-CM

## 2018-11-11 DIAGNOSIS — R569 Unspecified convulsions: Secondary | ICD-10-CM

## 2018-11-11 MED ORDER — GABAPENTIN 300 MG PO CAPS: ORAL_CAPSULE | ORAL | 11 refills | Status: DC

## 2018-11-11 NOTE — Progress Notes (Signed)
Virtual Visit via Video Note The purpose of this virtual visit is to provide medical care while limiting exposure to the novel coronavirus.    Consent was obtained for video visit:  Yes.   Answered questions that patient had about telehealth interaction:  Yes.   I discussed the limitations, risks, security and privacy concerns of performing an evaluation and management service by telemedicine. I also discussed with the patient that there may be a patient responsible charge related to this service. The patient expressed understanding and agreed to proceed.  Pt location: Home Physician Location: office Name of referring provider:  Saguier, Ramon DredgeEdward, PA-C I connected with Rolin BarrySharif L Violette at patients initiation/request on 11/11/2018 at  9:00 AM EDT by video enabled telemedicine application and verified that I am speaking with the correct person using two identifiers. Pt MRN:  [id number] Pt DOB:  1975/09/06 Video Participants:  Caffie DammeSharif L Franzen;  Ellwood SayersKim Baize (spouse)   History of Present Illness:  This is a 43 year old right-handed man with a history of hypertension, recurrent DVT, protein C deficiency, alcohol abuse, presenting for evaluation of neuropathy. He also recently had a new onset seizure felt secondary to alcohol withdrawal. He reports symptoms started around 2 months ago with back pain, records indicate this occurred in May 2020. He started having back pain going down his legs with numbness and tingling. The back pain has resolved however the paresthesias continue below his knees down to his toes. Every once in a [place] there is a pain like sticking with a pin. Hands are not affected. He loses his balance a little. He has urinary frequency, no bowel/bladder incontinence. He denies any falls. No family history of neuropathy. He was seen by Sports Medicine and had tried low dose gabapentin 100mg  qhs with no effect.   [facility] for  seizures felt secondary to alcohol withdrawal. His wife reports he came out of the bathroom and sat on the couch, then she heard a loud yell and found him with body tensed up, he had fallen off the couch. She reports he stopped breathing for a few minutes and she performed CPR, during which time his eyes opened and he could answer questions but did not remember what happened. He did not realize he had another GTC in the ER. CT head no acute changes. Per report he normally drinks 2-3 shots per day (today he reports 5-6 shots a day) then stopped abruptly few days prior. He had severe hypokalemia and hypomagnesemia, low B12, B1, and folate levels. He has been on replacement therapy since then. His wife reports he was very disoriented, he was zoning out when he first got home, this has not occurred since. She denies any staring/unresponsive episodes, he denies any other gaps in time, olfactory/gustatory hallucinations, deja vu, rising epigastric sensation, focal weakness. He has occasional jerking of his legs. He states he has cut down on alcohol, he is not drinking vodka any longer but still drinks 3 beers daily. He feels dizzy if he stands for a prolonged period, his BP drops to 90/50.  He had a normal birth and early development.  There is no history of febrile convulsions, CNS infections such as meningitis/encephalitis, significant traumatic brain injury, neurosurgical procedures, or family history of seizures.    PAST MEDICAL HISTORY: Past Medical History:  Diagnosis Date  . Allergy   . GERD (gastroesophageal reflux disease)   . Hypertensive urgency 02/28/2016  PAST SURGICAL HISTORY: Past Surgical History:  Procedure Laterality Date  . NO PAST SURGERIES      MEDICATIONS: Current Outpatient Medications on File Prior to Visit  Medication Sig Dispense Refill  . carvedilol (COREG) 12.5 MG tablet TAKE 1 TABLET(12.5 MG) BY MOUTH TWICE DAILY WITH A MEAL 60 tablet 5  . diphenhydrAMINE (BENADRYL)  25 MG tablet Take 25 mg by mouth every 6 (six) hours as needed for itching or allergies.    . fluticasone (FLONASE) 50 MCG/ACT nasal spray SHAKE LIQUID AND USE 2 SPRAYS IN EACH NOSTRIL DAILY 16 g 0  . folic acid (FOLVITE) 1 MG tablet TAKE 1 TABLET BY MOUTH EVERY DAY 90 tablet 0  . ibuprofen (ADVIL,MOTRIN) 200 MG tablet Take by mouth.    . potassium chloride (K-DUR) 10 MEQ tablet Take 1 tablet (10 mEq total) by mouth daily. 30 tablet 0  . Rivaroxaban (XARELTO) 15 MG TABS tablet Take 15 mg by mouth 2 (two) times daily with a meal.    . thiamine (VITAMIN B-1) 100 MG tablet Take 100 mg by mouth daily.     No current facility-administered medications on file prior to visit.     ALLERGIES: No Known Allergies  FAMILY HISTORY: Family History  Problem Relation Age of Onset  . Hypertension Mother      Observations/Objective:   Vitals:   11/10/18 1622  Weight: 130 lb (59 kg)  Height: 5\' 8"  (1.727 m)   GEN:  The patient appears stated age and is in NAD.  Neurological examination: Patient is awake, alert, oriented x 3. No aphasia or dysarthria. Intact fluency and comprehension. Remote and recent memory intact. Able to name and repeat. Cranial nerves: Extraocular movements intact with no nystagmus. No facial asymmetry. Motor: moves all extremities symmetrically, at least anti-gravity x 4. No incoordination on finger to nose testing. Gait: narrow-based and steady, difficulty with tandem walk .Negative Romberg test.   Assessment and Plan:   This is a 43 year old right-handed man with a history of hypertension, recurrent DVT, protein C deficiency, alcohol abuse, presenting for evaluation of neuropathy. He also recently had a new onset seizure on 09/04/2018 felt secondary to alcohol withdrawal. We discussed different causes of neuropathy, including nutritional neuropathy and alcohol-induced neuropathy, continue replacement of B12, thiamine, and folic acid. We discussed continued plans for alcohol  cessation. He was on a low dose of gabapentin, would increase dose further to 300mg  qhs x 1 week, then 300mg  BID. Side effects discussed. We also discussed how alcohol withdrawal can cause seizures. A routine EEG will be ordered. We discussed [place] driving laws to stop driving until 6 months seizure-free. Follow-up in 6 months, he knows to call for any changes.    Follow Up Instructions:   -I discussed the assessment and treatment plan with the patient. The patient was provided an opportunity to ask questions and all were answered. The patient agreed with the plan and demonstrated an understanding of the instructions.   The patient was advised to call back or seek an in-person evaluation if the symptoms worsen or if the condition fails to improve as anticipated.   Cameron Sprang, MD

## 2018-11-17 ENCOUNTER — Other Ambulatory Visit: Payer: Self-pay | Admitting: *Deleted

## 2018-11-17 DIAGNOSIS — I82621 Acute embolism and thrombosis of deep veins of right upper extremity: Secondary | ICD-10-CM

## 2018-11-18 ENCOUNTER — Encounter: Payer: Self-pay | Admitting: Hematology & Oncology

## 2018-11-18 ENCOUNTER — Other Ambulatory Visit: Payer: Self-pay

## 2018-11-18 ENCOUNTER — Inpatient Hospital Stay: Payer: Self-pay | Attending: Hematology & Oncology | Admitting: Hematology & Oncology

## 2018-11-18 ENCOUNTER — Inpatient Hospital Stay: Payer: Self-pay

## 2018-11-18 ENCOUNTER — Telehealth: Payer: Self-pay | Admitting: Hematology & Oncology

## 2018-11-18 VITALS — BP 144/98 | HR 91 | Temp 98.6°F | Resp 16 | Wt 121.0 lb

## 2018-11-18 DIAGNOSIS — F17210 Nicotine dependence, cigarettes, uncomplicated: Secondary | ICD-10-CM | POA: Insufficient documentation

## 2018-11-18 DIAGNOSIS — D6859 Other primary thrombophilia: Secondary | ICD-10-CM | POA: Insufficient documentation

## 2018-11-18 DIAGNOSIS — I82622 Acute embolism and thrombosis of deep veins of left upper extremity: Secondary | ICD-10-CM | POA: Insufficient documentation

## 2018-11-18 DIAGNOSIS — Z7289 Other problems related to lifestyle: Secondary | ICD-10-CM | POA: Insufficient documentation

## 2018-11-18 DIAGNOSIS — I82621 Acute embolism and thrombosis of deep veins of right upper extremity: Secondary | ICD-10-CM

## 2018-11-18 DIAGNOSIS — R059 Cough, unspecified: Secondary | ICD-10-CM

## 2018-11-18 DIAGNOSIS — R160 Hepatomegaly, not elsewhere classified: Secondary | ICD-10-CM

## 2018-11-18 DIAGNOSIS — R945 Abnormal results of liver function studies: Secondary | ICD-10-CM | POA: Insufficient documentation

## 2018-11-18 DIAGNOSIS — R05 Cough: Secondary | ICD-10-CM

## 2018-11-18 DIAGNOSIS — Z7901 Long term (current) use of anticoagulants: Secondary | ICD-10-CM | POA: Insufficient documentation

## 2018-11-18 LAB — CBC WITH DIFFERENTIAL (CANCER CENTER ONLY)
Abs Immature Granulocytes: 0.01 10*3/uL (ref 0.00–0.07)
Basophils Absolute: 0.1 10*3/uL (ref 0.0–0.1)
Basophils Relative: 1 %
Eosinophils Absolute: 0.2 10*3/uL (ref 0.0–0.5)
Eosinophils Relative: 2 %
HCT: 36.1 % — ABNORMAL LOW (ref 39.0–52.0)
Hemoglobin: 12.3 g/dL — ABNORMAL LOW (ref 13.0–17.0)
Immature Granulocytes: 0 %
Lymphocytes Relative: 49 %
Lymphs Abs: 4.1 10*3/uL — ABNORMAL HIGH (ref 0.7–4.0)
MCH: 31.5 pg (ref 26.0–34.0)
MCHC: 34.1 g/dL (ref 30.0–36.0)
MCV: 92.3 fL (ref 80.0–100.0)
Monocytes Absolute: 0.6 10*3/uL (ref 0.1–1.0)
Monocytes Relative: 7 %
Neutro Abs: 3.4 10*3/uL (ref 1.7–7.7)
Neutrophils Relative %: 41 %
Platelet Count: 142 10*3/uL — ABNORMAL LOW (ref 150–400)
RBC: 3.91 MIL/uL — ABNORMAL LOW (ref 4.22–5.81)
RDW: 20.2 % — ABNORMAL HIGH (ref 11.5–15.5)
WBC Count: 8.4 10*3/uL (ref 4.0–10.5)
nRBC: 0.2 % (ref 0.0–0.2)

## 2018-11-18 LAB — CMP (CANCER CENTER ONLY)
ALT: 23 U/L (ref 0–44)
AST: 60 U/L — ABNORMAL HIGH (ref 15–41)
Albumin: 3.3 g/dL — ABNORMAL LOW (ref 3.5–5.0)
Alkaline Phosphatase: 176 U/L — ABNORMAL HIGH (ref 38–126)
Anion gap: 8 (ref 5–15)
BUN: 5 mg/dL — ABNORMAL LOW (ref 6–20)
CO2: 27 mmol/L (ref 22–32)
Calcium: 8.8 mg/dL — ABNORMAL LOW (ref 8.9–10.3)
Chloride: 102 mmol/L (ref 98–111)
Creatinine: 0.76 mg/dL (ref 0.61–1.24)
GFR, Est AFR Am: >60 mL/min (ref >60)
GFR, Estimated: >60 mL/min (ref >60)
Glucose, Bld: 108 mg/dL — ABNORMAL HIGH (ref 70–99)
Potassium: 4.6 mmol/L (ref 3.5–5.1)
Sodium: 137 mmol/L (ref 135–145)
Total Bilirubin: 1 mg/dL (ref 0.3–1.2)
Total Protein: 7.4 g/dL (ref 6.5–8.1)

## 2018-11-18 MED ORDER — VITAMIN B-6 250 MG PO TABS: 250.0000 mg | ORAL_TABLET | Freq: Every day | ORAL | 8 refills | Status: DC

## 2018-11-18 NOTE — Telephone Encounter (Signed)
Spoke with patient to confirm Ct scans and Oct follow up per 9/10 LOS

## 2018-11-18 NOTE — Progress Notes (Signed)
Hematology and Oncology Follow Up Visit  Samuel Cantrell [id number] Jul 13, 1975 43 y.o. 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  Unfortunately, there is a lot going on with him now.  We last saw him over a year ago.  At that time, we does have him on aspirin since he cannot afford Xarelto or any of the other new oral anticoagulants.  Approximately 2 weeks ago, he developed a new thrombus in the left arm.  This was a thrombus in the left basilic and brachial veins.  He does have a little bit of pain in the left arm.  There was no swelling.  There is no erythema.  He had the Doppler done on 821 and 2020.  He is on the starter pack with Xarelto.  Unfortunately has no insurance so we do not have to try to figure out how to give him the Xarelto.  Beside this, he apparently had a seizure.  This was an alcohol withdrawal seizure.  I think that he has been drinking quite a bit.  He stopped all of a sudden.  He had a seizure.  He was hospitalized for this.  He is still smoking.  He does have some mildly elevated liver function studies.  I think were probably going to have to do a CT scan to take a better look at his liver.  Again he does have the Protein C deficiency.  This actually might be from his liver not functioning properly.  Last time we checked his lupus anticoagulant was back in June 2019.  He did not have a have lupus anticoagulant at that time.  Again, clearly I think there are other issues with him at this point.  He has had no bleeding.  There is been no change in bowel or bladder habits.  Overall, I would have to say that his performance status is ECOG 1.  Medications:  Allergies as of 11/18/2018   No  Known Allergies     Medication List       Accurate as of November 18, 2018  2:10 PM. If you have any questions, ask your nurse or doctor.        STOP taking these medications   ibuprofen 200 MG tablet Commonly known as: ADVIL Stopped by: Volanda Napoleon, MD     TAKE these medications   carvedilol 12.5 MG tablet Commonly known as: COREG TAKE 1 TABLET(12.5 MG) BY MOUTH TWICE DAILY WITH A MEAL   diphenhydrAMINE 25 MG tablet Commonly known as: BENADRYL Take 25 mg by mouth every 6 (six) hours as needed for itching or allergies.   fluticasone 50 MCG/ACT nasal spray Commonly known as: FLONASE SHAKE LIQUID AND USE 2 SPRAYS IN EACH NOSTRIL DAILY   folic acid 1 MG tablet Commonly known as: FOLVITE TAKE 1 TABLET BY MOUTH EVERY DAY   gabapentin 300 MG capsule Commonly known as: NEURONTIN Take 1 capsule every night for 1 week, then increase to 1 capsule twice a day   potassium chloride 10 MEQ tablet Commonly known as: K-DUR Take 1 tablet (10 mEq total) by mouth daily.   Rivaroxaban 15 MG Tabs tablet Commonly known as: XARELTO Take 15 mg by mouth 2 (two) times daily with a meal.  thiamine 100 MG tablet Commonly known as: VITAMIN B-1 Take 100 mg by mouth daily.       Allergies: No Known Allergies  Past Medical History, Surgical history, Social history, and Family History were reviewed and updated.  Review of Systems: Review of Systems  Constitutional: Negative.   HENT: Negative.   Eyes: Negative.   Respiratory: Negative.   Cardiovascular: Negative.   Gastrointestinal: Negative.   Genitourinary: Negative.   Musculoskeletal: Positive for myalgias.  Skin: Negative.   Neurological: Positive for seizures.  Endo/Heme/Allergies: Negative.   Psychiatric/Behavioral: Negative.      Physical Exam:  weight is 121 lb (54.9 kg). His temporal temperature is 98.6 F (37 C). His blood pressure is 144/98 (abnormal) and his pulse is 91. His respiration is 16 and oxygen  saturation is 100%.   Wt Readings from Last 3 Encounters:  11/18/18 121 lb (54.9 kg)  11/10/18 130 lb (59 kg)  10/29/18 120 lb 9.6 oz (54.7 kg)    Physical Exam Vitals signs reviewed.  HENT:     Head: Normocephalic and atraumatic.  Eyes:     Pupils: Pupils are equal, round, and reactive to light.  Neck:     Musculoskeletal: Normal range of motion.  Cardiovascular:     Rate and Rhythm: Normal rate and regular rhythm.     Heart sounds: Normal heart sounds.  Pulmonary:     Effort: Pulmonary effort is normal.     Breath sounds: Normal breath sounds.  Abdominal:     General: Bowel sounds are normal.     Palpations: Abdomen is soft.  Musculoskeletal: Normal range of motion.        General: No tenderness or deformity.  Lymphadenopathy:     Cervical: No cervical adenopathy.  Skin:    General: Skin is warm and dry.     Findings: No erythema or rash.  Neurological:     Mental Status: He is alert and oriented to person, place, and time.  Psychiatric:        Behavior: Behavior normal.        Thought Content: Thought content normal.        Judgment: Judgment normal.      Lab Results  Component Value Date   WBC 8.4 11/18/2018   HGB 12.3 (L) 11/18/2018   HCT 36.1 (L) 11/18/2018   MCV 92.3 11/18/2018   PLT 142 (L) 11/18/2018   No results found for: FERRITIN, IRON, TIBC, UIBC, IRONPCTSAT Lab Results  Component Value Date   RBC 3.91 (L) 11/18/2018   No results found for: KPAFRELGTCHN, LAMBDASER, KAPLAMBRATIO No results found for: IGGSERUM, IGA, IGMSERUM No results found for: Marda StalkerOTALPROTELP, ALBUMINELP, A1GS, A2GS, BETS, BETA2SER, GAMS, MSPIKE, SPEI   Chemistry      Component Value Date/Time   NA 137 11/18/2018 1321   NA 141 07/29/2018 1025   NA 145 01/30/2017 0952   K 4.6 11/18/2018 1321   K 4.1 01/30/2017 0952   CL 102 11/18/2018 1321   CL 105 01/30/2017 0952   CO2 27 11/18/2018 1321   CO2 28 01/30/2017 0952   BUN 5 (L) 11/18/2018 1321   BUN 8 07/29/2018 1025    BUN 11 01/30/2017 0952   CREATININE 0.76 11/18/2018 1321   CREATININE 1.1 01/30/2017 0952      Component Value Date/Time   CALCIUM 8.8 (L) 11/18/2018 1321   CALCIUM 8.7 01/30/2017 0952   ALKPHOS 176 (H) 11/18/2018 1321   ALKPHOS 162 (H) 01/30/2017 0952   AST 60 (  H) 11/18/2018 1321   ALT 23 11/18/2018 1321   ALT 92 (H) 01/30/2017 0952   BILITOT 1.0 11/18/2018 1321      Impression and Plan: Ms. Ritchie is a very pleasant 43 yo African American gentleman with Protein C deficiency and a transiently positive lupus anticoagulant. He has history of a right upper extremity brachial DVT which has since resolved.  Now, there is a thrombus in the left arm.  Unfortunately, he is going to have to be on lifelong anticoagulation now.  This will be very tricky as to how to if this paid for so we can take it.  I think the alcohol use is a problem.  I suspect that he probably does have some evidence of cirrhosis.  We will get a CT scan.  I want a CT scan of his chest/abdomen/pelvis so that I can actually look at the lungs to make sure there is nothing going on there since he is a smoker.  I spent about 45 minutes with him today.  All this is new.  This was quite complicated.  I did order all of his tests.  We will plan to get him back to see [place] in about 6 weeks.  We will get a Doppler of his left arm when we see him back to see how the thrombus is doing.  At this thrombus has resolved, I will decrease his Xarelto dose down to 10 mg p.o. daily.  Josph Macho, MD 9/10/20202:10 PM

## 2018-11-19 ENCOUNTER — Other Ambulatory Visit: Payer: Self-pay

## 2018-11-19 ENCOUNTER — Telehealth: Payer: Self-pay | Admitting: *Deleted

## 2018-11-19 DIAGNOSIS — R569 Unspecified convulsions: Secondary | ICD-10-CM

## 2018-11-19 NOTE — Telephone Encounter (Signed)
Message received from patient stating that [facility] in [place] stated that they did not receive prescription for Vitamin B-6 from Dr. Marin Olp.  Call placed to [facility] and they stated that they did receive prescription for Vitamin B6 250 mg, but they do not carry 250 mg tablets and that pt needs to get OTC Vitamin B6 which comes in either 50mg  or 100mg  doses.  Call placed back to patient and patient notified of above instructions per his pharmacy and states that he will get OTC Vitamin B6 as his pharmacy suggests.

## 2018-11-23 ENCOUNTER — Telehealth: Payer: Self-pay | Admitting: Medical

## 2018-11-23 MED ORDER — POTASSIUM CHLORIDE CRYS ER 20 MEQ PO TBCR: EXTENDED_RELEASE_TABLET | ORAL | 3 refills | Status: DC

## 2018-11-23 NOTE — Telephone Encounter (Signed)
Sent in new tx. 20 meq. Sig is every other day.

## 2018-11-23 NOTE — Telephone Encounter (Signed)
Medication Refill - Medication: potassium chloride (K-DUR) 10 MEQ tablet -- rx change!  Has the patient contacted their pharmacy? Yes.    Pt states he is no longer taking 10 MEQ tabs and is now taking 20 MEQ tabs. Per pt: Pharmacy is requesting new rx.    Preferred Pharmacy:  [facility] [place], [address] AT [address] (Phone) [phone] (Fax)     Pt was advised that RX refills may take up to 3 business days. We ask that you follow-up with your pharmacy.

## 2018-11-23 NOTE — Telephone Encounter (Signed)
Okay to change rx? 

## 2018-11-25 ENCOUNTER — Telehealth: Payer: Self-pay | Admitting: *Deleted

## 2018-11-25 NOTE — Telephone Encounter (Signed)
LMOM to call us back to schedule EEG. I left several dates and times available.

## 2018-11-29 ENCOUNTER — Ambulatory Visit (INDEPENDENT_AMBULATORY_CARE_PROVIDER_SITE_OTHER): Payer: Self-pay | Admitting: Neurology

## 2018-11-29 ENCOUNTER — Other Ambulatory Visit: Payer: Self-pay

## 2018-11-29 DIAGNOSIS — R569 Unspecified convulsions: Secondary | ICD-10-CM

## 2018-12-01 ENCOUNTER — Encounter (HOSPITAL_BASED_OUTPATIENT_CLINIC_OR_DEPARTMENT_OTHER): Payer: Self-pay

## 2018-12-01 ENCOUNTER — Ambulatory Visit (HOSPITAL_BASED_OUTPATIENT_CLINIC_OR_DEPARTMENT_OTHER)
Admission: RE | Admit: 2018-12-01 | Discharge: 2018-12-01 | Disposition: A | Discharge status: Home / Self Care | Payer: Self-pay | Source: Ambulatory Visit | Attending: Hematology & Oncology | Admitting: Hematology & Oncology

## 2018-12-01 ENCOUNTER — Other Ambulatory Visit: Payer: Self-pay

## 2018-12-01 ENCOUNTER — Telehealth: Payer: Self-pay | Admitting: Neurology

## 2018-12-01 DIAGNOSIS — R05 Cough: Secondary | ICD-10-CM | POA: Insufficient documentation

## 2018-12-01 DIAGNOSIS — R160 Hepatomegaly, not elsewhere classified: Secondary | ICD-10-CM

## 2018-12-01 DIAGNOSIS — R059 Cough, unspecified: Secondary | ICD-10-CM

## 2018-12-01 MED ORDER — IOHEXOL 300 MG/ML  SOLN
100.0000 mL | Freq: Once | INTRAMUSCULAR | Status: AC | PRN
  Administered 2018-12-01: 100 mL via INTRAVENOUS

## 2018-12-01 NOTE — Procedures (Signed)
ELECTROENCEPHALOGRAM REPORT  Date of Study: 11/29/2018  Patient's Name: Samuel Cantrell MRN: [id number] Date of Birth: 1975-10-28  Referring Provider: Dr. Ellouise Newer  Clinical History: This is a 43 year old man with new onset seizure in June 2020 felt secondary to alcohol withdrawal.   Medications: GABAPENTIN COREG 12.5 MG tablet BENADRYL 25 MG tablet FLONASE 50 MCG/ACT nasal spray FOLVITE 1 MG tablet ADVIL,MOTRIN 200 MG tablet K-DUR 10 MEQ tablet XARELTO 15 MG TABS tablet VITAMIN B-1 100 MG tablet   Technical Summary: A multichannel digital EEG recording measured by the international 10-20 system with electrodes applied with paste and impedances below 5000 ohms performed in our laboratory with EKG monitoring in an awake patient.  Hyperventilation was not performed. Photic stimulation was performed.  The digital EEG was referentially recorded, reformatted, and digitally filtered in a variety of bipolar and referential montages for optimal display.    Description: The patient is awake during the recording.  During maximal wakefulness, there is a symmetric, medium voltage 9-10 Hz posterior dominant rhythm that attenuates with eye opening.  The record is symmetric.  Sleep was not captured. Photic stimulation did not elicit any abnormalities.  There were no epileptiform discharges or electrographic seizures seen.    EKG lead was unremarkable.  Impression: This awake EEG is normal.    Clinical Correlation: A normal EEG does not exclude a clinical diagnosis of epilepsy.  If further clinical questions remain, prolonged EEG may be helpful.  Clinical correlation is advised.   Ellouise Newer, M.D.

## 2018-12-01 NOTE — Telephone Encounter (Signed)
Monitor headache for now, can take as needed Tylenol, but do not take more than 2-3 times a week. Thanks

## 2018-12-01 NOTE — Telephone Encounter (Signed)
Notified pt to monitor headache with Dr. Delice Lesch instructions.

## 2018-12-01 NOTE — Telephone Encounter (Signed)
Called pt EEG resultsbrain wave test was normal by Dr Delice Lesch. Pt mention having slight headache on the left side of the head.

## 2018-12-01 NOTE — Telephone Encounter (Signed)
Patient called again to see if his EEG results are ready.

## 2018-12-01 NOTE — Telephone Encounter (Signed)
Patient returned a call to the office regarding EEG results. Thank you

## 2018-12-02 ENCOUNTER — Encounter: Payer: Self-pay | Admitting: *Deleted

## 2018-12-22 ENCOUNTER — Other Ambulatory Visit: Payer: Self-pay | Admitting: Medical

## 2018-12-23 ENCOUNTER — Other Ambulatory Visit: Payer: Self-pay

## 2018-12-23 ENCOUNTER — Other Ambulatory Visit: Payer: Self-pay | Admitting: *Deleted

## 2018-12-23 ENCOUNTER — Inpatient Hospital Stay: Payer: Self-pay

## 2018-12-23 ENCOUNTER — Ambulatory Visit (HOSPITAL_BASED_OUTPATIENT_CLINIC_OR_DEPARTMENT_OTHER)
Admission: RE | Admit: 2018-12-23 | Discharge: 2018-12-23 | Disposition: A | Discharge status: Home / Self Care | Payer: Self-pay | Source: Ambulatory Visit | Attending: Hematology & Oncology | Admitting: Hematology & Oncology

## 2018-12-23 ENCOUNTER — Encounter: Payer: Self-pay | Admitting: Hematology & Oncology

## 2018-12-23 ENCOUNTER — Telehealth: Payer: Self-pay | Admitting: *Deleted

## 2018-12-23 ENCOUNTER — Inpatient Hospital Stay: Payer: Self-pay | Attending: Hematology & Oncology | Admitting: Hematology & Oncology

## 2018-12-23 VITALS — BP 155/93 | HR 83 | Temp 98.7°F | Resp 16 | Wt 123.0 lb

## 2018-12-23 DIAGNOSIS — I82621 Acute embolism and thrombosis of deep veins of right upper extremity: Secondary | ICD-10-CM | POA: Insufficient documentation

## 2018-12-23 DIAGNOSIS — R160 Hepatomegaly, not elsewhere classified: Secondary | ICD-10-CM

## 2018-12-23 DIAGNOSIS — Z86711 Personal history of pulmonary embolism: Secondary | ICD-10-CM | POA: Insufficient documentation

## 2018-12-23 DIAGNOSIS — D6859 Other primary thrombophilia: Secondary | ICD-10-CM | POA: Insufficient documentation

## 2018-12-23 DIAGNOSIS — Z7901 Long term (current) use of anticoagulants: Secondary | ICD-10-CM | POA: Insufficient documentation

## 2018-12-23 DIAGNOSIS — D6862 Lupus anticoagulant syndrome: Secondary | ICD-10-CM | POA: Insufficient documentation

## 2018-12-23 DIAGNOSIS — R1010 Upper abdominal pain, unspecified: Secondary | ICD-10-CM

## 2018-12-23 LAB — CMP (CANCER CENTER ONLY)
ALT: 47 U/L — ABNORMAL HIGH (ref 0–44)
AST: 88 U/L — ABNORMAL HIGH (ref 15–41)
Albumin: 3.4 g/dL — ABNORMAL LOW (ref 3.5–5.0)
Alkaline Phosphatase: 139 U/L — ABNORMAL HIGH (ref 38–126)
Anion gap: 7 (ref 5–15)
BUN: 3 mg/dL — ABNORMAL LOW (ref 6–20)
CO2: 30 mmol/L (ref 22–32)
Calcium: 9 mg/dL (ref 8.9–10.3)
Chloride: 98 mmol/L (ref 98–111)
Creatinine: 0.71 mg/dL (ref 0.61–1.24)
GFR, Est AFR Am: >60 mL/min (ref >60)
GFR, Estimated: >60 mL/min (ref >60)
Glucose, Bld: 111 mg/dL — ABNORMAL HIGH (ref 70–99)
Potassium: 3.5 mmol/L (ref 3.5–5.1)
Sodium: 135 mmol/L (ref 135–145)
Total Bilirubin: 0.8 mg/dL (ref 0.3–1.2)
Total Protein: 7.4 g/dL (ref 6.5–8.1)

## 2018-12-23 LAB — CBC WITH DIFFERENTIAL (CANCER CENTER ONLY)
Abs Immature Granulocytes: 0.03 10*3/uL (ref 0.00–0.07)
Basophils Absolute: 0.1 10*3/uL (ref 0.0–0.1)
Basophils Relative: 1 %
Eosinophils Absolute: 0.2 10*3/uL (ref 0.0–0.5)
Eosinophils Relative: 2 %
HCT: 36.2 % — ABNORMAL LOW (ref 39.0–52.0)
Hemoglobin: 13.1 g/dL (ref 13.0–17.0)
Immature Granulocytes: 0 %
Lymphocytes Relative: 46 %
Lymphs Abs: 4 10*3/uL (ref 0.7–4.0)
MCH: 32.3 pg (ref 26.0–34.0)
MCHC: 36.2 g/dL — ABNORMAL HIGH (ref 30.0–36.0)
MCV: 89.4 fL (ref 80.0–100.0)
Monocytes Absolute: 0.7 10*3/uL (ref 0.1–1.0)
Monocytes Relative: 8 %
Neutro Abs: 3.6 10*3/uL (ref 1.7–7.7)
Neutrophils Relative %: 43 %
Platelet Count: 123 10*3/uL — ABNORMAL LOW (ref 150–400)
RBC: 4.05 MIL/uL — ABNORMAL LOW (ref 4.22–5.81)
RDW: 14.8 % (ref 11.5–15.5)
WBC Count: 8.6 10*3/uL (ref 4.0–10.5)
nRBC: 0 % (ref 0.0–0.2)

## 2018-12-23 MED ORDER — RIVAROXABAN 15 MG PO TABS: 15.0000 mg | ORAL_TABLET | Freq: Two times a day (BID) | ORAL | 0 refills | Status: DC

## 2018-12-23 MED ORDER — RIVAROXABAN 10 MG PO TABS: 10.0000 mg | ORAL_TABLET | Freq: Every day | ORAL | 3 refills | Status: DC

## 2018-12-23 NOTE — Telephone Encounter (Signed)
Notified pt of results. No concerns at this time. 

## 2018-12-23 NOTE — Progress Notes (Signed)
Hematology and Oncology Follow Up Visit  Samuel Cantrell [id number] 11-24-1975 43 y.o. 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  He is doing okay.  His main complaint has been pain in the right upper quadrant of his abdomen.  He did have a CT scan done a few months ago.  I think this probably showed some gallstones.  We will have to do a gallbladder emptying scan to see if his gallbladder is not working right.  We did do a ultrasound of his left arm.  This was done today, this did not show any residual thrombus in the left brachial vein.  I am going to decrease his Xarelto down to 10 mg a day.  We will try to get the medication for him at a markedly reduced cost as he has no insurance.  He is not drinking.  He says he smokes about 5-6 cigarettes a day.  There is been no seizures.  He has had no syncopal episodes.  He has had no nausea or vomiting.  There is been no bleeding.    Overall, I would have to say that his performance status is ECOG 1.  Medications:  Allergies as of 12/23/2018   No Known Allergies     Medication List       Accurate as of December 23, 2018 12:02 PM. If you have any questions, ask your nurse or doctor.        carvedilol 12.5 MG tablet Commonly known as: COREG TAKE 1 TABLET(12.5 MG) BY MOUTH TWICE DAILY WITH A MEAL   diphenhydrAMINE 25 MG tablet Commonly known as: BENADRYL Take 25 mg by mouth every 6 (six) hours as needed for itching or allergies.   fluticasone 50 MCG/ACT nasal spray Commonly known as: FLONASE SHAKE LIQUID AND USE 2 SPRAYS IN EACH NOSTRIL DAILY   folic acid 1 MG tablet Commonly known as: FOLVITE TAKE 1 TABLET BY MOUTH EVERY DAY    gabapentin 300 MG capsule Commonly known as: NEURONTIN Take 1 capsule every night for 1 week, then increase to 1 capsule twice a day   potassium chloride SA 20 MEQ tablet Commonly known as: KLOR-CON 1 tab po every other day   rivaroxaban 10 MG Tabs tablet Commonly known as: XARELTO Take 1 tablet (10 mg total) by mouth daily. What changed:   medication strength  how much to take  when to take this Changed by: Margaretha Seeds, RN   thiamine 100 MG tablet Commonly known as: VITAMIN B-1 Take 100 mg by mouth daily.   vitamin B-6 250 MG tablet Take 1 tablet (250 mg total) by mouth daily.       Allergies: No Known Allergies  Past Medical History, Surgical history, Social history, and Family History were reviewed and updated.  Review of Systems: Review of Systems  Constitutional: Negative.   HENT: Negative.   Eyes: Negative.   Respiratory: Negative.   Cardiovascular: Negative.   Gastrointestinal: Negative.   Genitourinary: Negative.   Musculoskeletal: Positive for myalgias.  Skin: Negative.   Neurological: Positive for seizures.  Endo/Heme/Allergies: Negative.   Psychiatric/Behavioral: Negative.      Physical Exam:  weight is 123 lb (55.8 kg). His temporal temperature is 98.7  F (37.1 C). His blood pressure is 155/93 (abnormal) and his pulse is 83. His respiration is 16 and oxygen saturation is 100%.   Wt Readings from Last 3 Encounters:  12/23/18 123 lb (55.8 kg)  11/18/18 121 lb (54.9 kg)  11/10/18 130 lb (59 kg)    Physical Exam Vitals signs reviewed.  HENT:     Head: Normocephalic and atraumatic.  Eyes:     Pupils: Pupils are equal, round, and reactive to light.  Neck:     Musculoskeletal: Normal range of motion.  Cardiovascular:     Rate and Rhythm: Normal rate and regular rhythm.     Heart sounds: Normal heart sounds.  Pulmonary:     Effort: Pulmonary effort is normal.     Breath sounds: Normal breath sounds.  Abdominal:     General: Bowel  sounds are normal.     Palpations: Abdomen is soft.  Musculoskeletal: Normal range of motion.        General: No tenderness or deformity.  Lymphadenopathy:     Cervical: No cervical adenopathy.  Skin:    General: Skin is warm and dry.     Findings: No erythema or rash.  Neurological:     Mental Status: He is alert and oriented to person, place, and time.  Psychiatric:        Behavior: Behavior normal.        Thought Content: Thought content normal.        Judgment: Judgment normal.      Lab Results  Component Value Date   WBC 8.6 12/23/2018   HGB 13.1 12/23/2018   HCT 36.2 (L) 12/23/2018   MCV 89.4 12/23/2018   PLT 123 (L) 12/23/2018   No results found for: FERRITIN, IRON, TIBC, UIBC, IRONPCTSAT Lab Results  Component Value Date   RBC 4.05 (L) 12/23/2018   No results found for: KPAFRELGTCHN, LAMBDASER, KAPLAMBRATIO No results found for: IGGSERUM, IGA, IGMSERUM No results found for: Odetta Pink, SPEI   Chemistry      Component Value Date/Time   NA 135 12/23/2018 1100   NA 141 07/29/2018 1025   NA 145 01/30/2017 0952   K 3.5 12/23/2018 1100   K 4.1 01/30/2017 0952   CL 98 12/23/2018 1100   CL 105 01/30/2017 0952   CO2 30 12/23/2018 1100   CO2 28 01/30/2017 0952   BUN 3 (L) 12/23/2018 1100   BUN 8 07/29/2018 1025   BUN 11 01/30/2017 0952   CREATININE 0.71 12/23/2018 1100   CREATININE 1.1 01/30/2017 0952      Component Value Date/Time   CALCIUM 9.0 12/23/2018 1100   CALCIUM 8.7 01/30/2017 0952   ALKPHOS 139 (H) 12/23/2018 1100   ALKPHOS 162 (H) 01/30/2017 0952   AST 88 (H) 12/23/2018 1100   ALT 47 (H) 12/23/2018 1100   ALT 92 (H) 01/30/2017 0952   BILITOT 0.8 12/23/2018 1100      Impression and Plan: Samuel Cantrell is a very pleasant 43 yo African American gentleman with Protein C deficiency and a transiently positive lupus anticoagulant. He has history of a right upper extremity brachial DVT which has  since resolved.  Now, there is a thrombus in the left arm.  I still would hate to commit him to lifelong anticoagulation.  Hopefully we can get away and not have to do that.  He will be on low-dose Xarelto for at least a year.  Again, we will have to think about  lifelong anticoagulation since this is a recurrent thrombus and he does have a hypercoagulable state.  We will plan to see about the gallbladder.  If you need surgery for this, I do not think anticoagulation would be an issue.  I will plan to get him back to see us in another 3 or 4 months.    Josph MachoPeter R , MD 10/15/202012:02 PM

## 2018-12-23 NOTE — Telephone Encounter (Signed)
-----   Message from Volanda Napoleon, MD sent at 12/23/2018 11:10 AM EDT ----- Call - the blood clots if the left arm are now gone!!  Pet

## 2019-01-12 ENCOUNTER — Other Ambulatory Visit: Payer: Self-pay

## 2019-01-12 ENCOUNTER — Ambulatory Visit (HOSPITAL_COMMUNITY)
Admission: RE | Admit: 2019-01-12 | Discharge: 2019-01-12 | Disposition: A | Discharge status: Home / Self Care | Payer: Medicaid Other | Source: Ambulatory Visit | Attending: Hematology & Oncology | Admitting: Hematology & Oncology

## 2019-01-12 DIAGNOSIS — R1010 Upper abdominal pain, unspecified: Secondary | ICD-10-CM | POA: Insufficient documentation

## 2019-01-12 MED ORDER — TECHNETIUM TC 99M MEBROFENIN IV KIT
5.1000 | PACK | Freq: Once | INTRAVENOUS | Status: AC | PRN
  Administered 2019-01-12: 10:00:00 5.1 via INTRAVENOUS

## 2019-01-13 ENCOUNTER — Telehealth: Payer: Self-pay | Admitting: *Deleted

## 2019-01-13 NOTE — Telephone Encounter (Signed)
Patient notified per order of Dr. Marin Olp that "the gallbladder is working ok!!  No gallbladder inflammation." Pt appreciative of call and states that he is still having right lower abdominal pain.  Dr. Marin Olp notified and order received for patient to contact his PCP regarding right lower abdominal pain. Pt notified of MD order and states that he will contact his PCP regarding his pain.

## 2019-01-13 NOTE — Telephone Encounter (Signed)
-----   Message from Volanda Napoleon, MD sent at 01/12/2019  4:32 PM EST ----- Call - the gallbladder is working ok!!  No gallbladder inflammation.  [name]

## 2019-01-17 ENCOUNTER — Other Ambulatory Visit: Payer: Self-pay | Admitting: *Deleted

## 2019-01-17 MED ORDER — RIVAROXABAN 10 MG PO TABS: 10.0000 mg | ORAL_TABLET | Freq: Every day | ORAL | 6 refills | Status: DC

## 2019-03-10 ENCOUNTER — Other Ambulatory Visit: Payer: Self-pay | Admitting: Medical

## 2019-03-31 ENCOUNTER — Inpatient Hospital Stay: Payer: Medicaid Other

## 2019-03-31 ENCOUNTER — Inpatient Hospital Stay: Payer: Medicaid Other | Attending: Hematology & Oncology | Admitting: Family

## 2019-04-26 ENCOUNTER — Other Ambulatory Visit: Payer: Self-pay | Admitting: *Deleted

## 2019-04-26 MED ORDER — CARVEDILOL 12.5 MG PO TABS: ORAL_TABLET | ORAL | 5 refills | Status: DC

## 2019-05-25 ENCOUNTER — Encounter: Payer: Self-pay | Admitting: Neurology

## 2019-05-25 ENCOUNTER — Other Ambulatory Visit: Payer: Self-pay

## 2019-05-25 ENCOUNTER — Ambulatory Visit (INDEPENDENT_AMBULATORY_CARE_PROVIDER_SITE_OTHER): Payer: Self-pay | Admitting: Neurology

## 2019-05-25 VITALS — BP 171/102 | HR 88 | Ht 67.5 in | Wt 131.2 lb

## 2019-05-25 DIAGNOSIS — G629 Polyneuropathy, unspecified: Secondary | ICD-10-CM

## 2019-05-25 DIAGNOSIS — R569 Unspecified convulsions: Secondary | ICD-10-CM

## 2019-05-25 MED ORDER — GABAPENTIN 300 MG PO CAPS: ORAL_CAPSULE | ORAL | 11 refills | Status: DC

## 2019-05-25 NOTE — Progress Notes (Signed)
NEUROLOGY FOLLOW UP OFFICE NOTE  Samuel Cantrell [id number] [birth year]  HISTORY OF PRESENT ILLNESS: I had the pleasure of seeing Samuel Cantrell in follow-up in the neurology clinic on 05/25/2019.  The patient was last seen 6 months ago for new onset seizure (likely alcohol withdrawal) and neuropathy. He is alone in the office today. His EEG was normal. No further seizures since June 2020. He was reporting constant paresthesias in both legs. Gabapentin dose increased to 300 mg BID, then after visit with his hematologist, he increased to 600mg  BID. The gabapentin has helped, initially symptoms were up to his thighs, now mostly below the knees with occasional needle prick sensations in his feet. His calves feel tight. Hands are unaffected. No weakness. He feels his gait is better, no falls. No headaches, dizziness, vision changes. 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 He has stopped drinking liquor and drinks beer sometimes.  History on Initial Assessment 11/11/2018: This is a 44 year old right-handed man with a history of hypertension, recurrent DVT, protein C deficiency, alcohol abuse, presenting for evaluation of neuropathy. He also recently had a new onset seizure felt secondary to alcohol withdrawal. He reports symptoms started around 2 months ago with back pain, records indicate this occurred in May 2020. He started having back pain going down his legs with numbness and tingling. The back pain has resolved however the paresthesias continue below his knees down to his toes. Every once in a while there is a pain like sticking with a pin. Hands are not affected. He loses his balance a little. He has urinary frequency, no bowel/bladder incontinence. He denies any falls. No family history of neuropathy. He was seen by Sports Medicine and had tried low dose gabapentin 100mg  qhs with no effect.   He was admitted to [facility] in [facility] last 09/04/2018 for seizures felt secondary to  alcohol withdrawal. His wife reports he came out of the bathroom and sat on the couch, then she heard a loud yell and found him with body tensed up, he had fallen off the couch. She reports he stopped breathing for a few minutes and she performed CPR, during which time his eyes opened and he could answer questions but did not remember what happened. He did not realize he had another GTC in the ER. CT head no acute changes. Per report he normally drinks 2-3 shots per day (today he reports 5-6 shots a day) then stopped abruptly few days prior. He had severe hypokalemia and hypomagnesemia, low B12, B1, and folate levels. He has been on replacement therapy since then. His wife reports he was very disoriented, he was zoning out when he first got home, this has not occurred since. She denies any staring/unresponsive episodes, he denies any other gaps in time, olfactory/gustatory hallucinations, deja vu, rising epigastric sensation, focal weakness. He has occasional jerking of his legs. He states he has cut down on alcohol, he is not drinking vodka any longer but still drinks 3 beers daily. He feels dizzy if he stands for a prolonged period, his BP drops to 90/50.  He had a normal birth and early development.  There is no history of febrile convulsions, CNS infections such as meningitis/encephalitis, significant traumatic brain injury, neurosurgical procedures, or family history of seizures.    PAST MEDICAL HISTORY: Past Medical History:  Diagnosis Date  . Allergy   . GERD (gastroesophageal reflux disease)   . Hypertensive urgency 02/28/2016    MEDICATIONS:  Current Outpatient Medications on File Prior to Visit  Medication Sig Dispense Refill  . carvedilol (COREG) 12.5 MG tablet TAKE 1 TABLET BY MOUTH TWICE DAILY WITH A MEAL 60 tablet 5  . diphenhydrAMINE (BENADRYL) 25 MG tablet Take 25 mg by mouth every 6 (six) hours as needed for itching or allergies.    [name] Kitchen gabapentin (NEURONTIN) 300 MG capsule Take 1  capsule every night for 1 week, then increase to 1 capsule twice a day 60 capsule 11  . potassium chloride SA (K-DUR) 20 MEQ tablet 1 tab po every other day 15 tablet 3  . Pyridoxine HCl (VITAMIN B-6) 250 MG tablet Take 1 tablet (250 mg total) by mouth daily. 30 tablet 8  . rivaroxaban (XARELTO) 10 MG TABS tablet Take 1 tablet (10 mg total) by mouth daily. 30 tablet 6  . thiamine (VITAMIN B-1) 100 MG tablet Take 100 mg by mouth daily.     No current facility-administered medications on file prior to visit.    ALLERGIES: No Known Allergies  FAMILY HISTORY: Family History  Problem Relation Age of Onset  . Hypertension Mother     SOCIAL HISTORY: Social History   Socioeconomic History  . Marital status: Married    Spouse name: Not on file  . Number of children: Not on file  . Years of education: Not on file  . Highest education level: Not on file  Occupational History  . Occupation: Musician  Tobacco Use  . Smoking status: Current Every Day Smoker    Packs/day: 0.50    Years: 8.00    Pack years: 4.00    Types: Cigarettes  . Smokeless tobacco: Never Used  Substance and Sexual Activity  . Alcohol use: Yes    Comment: every other night 2 beers  . Drug use: No  . Sexual activity: Yes  Other Topics Concern  . Not on file  Social History Narrative   Pt lives with wife.   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 Difficulty of Paying Living Expenses:   Food Insecurity:   . Worried About Charity fundraiser in the Last Year:   . Arboriculturist in the Last Year:   Transportation Needs:   . Film/video editor (Medical):   [name] Kitchen Lack of Transportation (Non-Medical):   Physical Activity:   . Days of Exercise per Week:   . Minutes of Exercise per Session:   Stress:   . Feeling of Stress :   Social Connections:   . Frequency of Communication with Friends and Family:   . Frequency of  Social Gatherings with Friends and Family:   . Attends Religious Services:   . Active Member of Clubs or Organizations:   . Attends Archivist Meetings:   [name] Kitchen Marital Status:   Intimate Partner Violence:   . Fear of Current or Ex-Partner:   . Emotionally Abused:   [name] Kitchen Physically Abused:   . Sexually Abused:     REVIEW OF SYSTEMS: Constitutional: No fevers, chills, or sweats, no generalized fatigue, change in appetite Eyes: No visual changes, double vision, eye pain Ear, nose and throat: No hearing loss, ear pain, nasal congestion, sore throat Cardiovascular: No chest pain, palpitations Respiratory:  No shortness of breath at rest or with exertion, wheezes GastrointestinaI: No nausea, vomiting, diarrhea, abdominal pain, fecal incontinence Genitourinary:  No dysuria, urinary retention or frequency  Musculoskeletal:  No neck pain, back pain Integumentary: No rash, pruritus, skin lesions Neurological: as above Psychiatric: No depression, insomnia, anxiety Endocrine: No palpitations, fatigue, diaphoresis, mood swings, change in appetite, change in weight, increased thirst Hematologic/Lymphatic:  No anemia, purpura, petechiae. Allergic/Immunologic: no itchy/runny eyes, nasal congestion, recent allergic reactions, rashes  PHYSICAL EXAM: Vitals:   05/25/19 1534  BP: (!) 171/102  Pulse: 88  SpO2: 99%   General: No acute distress Head:  Normocephalic/atraumatic Skin/Extremities: No rash, no edema Neurological Exam: alert and oriented to person, place, and time. No aphasia or dysarthria. Fund of knowledge is appropriate.  Recent and remote memory are intact.  Attention and concentration are normal.  Cranial nerves: Pupils equal, round, reactive to light. Extraocular movements intact with no nystagmus. Visual fields full. Facial sensation intact. No facial asymmetry. Motor: Bulk and tone normal, muscle strength 5/5 throughout with no pronator drift.  Sensation to light touch,  temperature, pin/ Decreased vibration sense to left knee, right ankle. Deep tendon reflexes +1 throughout, toes downgoing.  Finger to nose testing intact.  Gait narrow-based and steady, able to tandem walk adequately.  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 He has had some improvement with gabapentin, increase to 600mg  in AM, 900mg  in PM. Bloodwork for B1, B12, B6, and folic acid levels will be ordered today, continue with supplementation. He was encouraged to continue with plans for alcohol avoidance/cessation. No further seizures since June 2020 (felt secondary to alcohol withdrawal). He is aware of [place] driving laws to stop driving until 6 months seizure-free. Follow-up in 6 months, he knows to call for any changes.   Thank you for allowing me to participate in his care.  Please do not hesitate to call for any questions or concerns.   , M.D.   CC: July 2020, PA-C

## 2019-05-25 NOTE — Patient Instructions (Addendum)
Great seeing you!   1. Increase gabapentin 300mg : take 2 caps in AM, 3 caps in PM  2. Bloodwork today for B1, B12, B6, folic acid levels  3. Continue with alcohol avoidance  4. Follow-up in 6 months, call for any changes

## 2019-05-28 LAB — VITAMIN B1: Vitamin B1 (Thiamine): <6 nmol/L — ABNORMAL LOW (ref 8–30)

## 2019-05-28 LAB — VITAMIN B6: Vitamin B6: 8.2 ng/mL (ref 2.1–21.7)

## 2019-05-28 LAB — FOLATE: Folate: 2.4 ng/mL — ABNORMAL LOW

## 2019-05-28 LAB — VITAMIN B12: Vitamin B-12: 499 pg/mL (ref 200–1100)

## 2019-06-02 ENCOUNTER — Telehealth: Payer: Self-pay

## 2019-06-02 ENCOUNTER — Telehealth: Payer: Self-pay | Admitting: Neurology

## 2019-06-02 MED ORDER — FOLIC ACID 1 MG PO TABS: 1.0000 mg | ORAL_TABLET | Freq: Every day | ORAL | 0 refills | Status: DC

## 2019-06-02 MED ORDER — THIAMINE HCL 100 MG PO TABS: 100.0000 mg | ORAL_TABLET | Freq: Every day | ORAL | 0 refills | Status: DC

## 2019-06-02 NOTE — Telephone Encounter (Signed)
-----   Message from Van Clines, MD sent at 06/02/2019  9:56 AM EDT ----- Pls let him know his thiamine and folic acid levels were low, pls confirm that he is taking thiamine 100mg  tablet daily? Also he will need to take folic acid 1mg  daily. We can send Rx if needed, thanks

## 2019-06-02 NOTE — Telephone Encounter (Signed)
The following message was left with [facility] on 06/02/19 at 12:06 PM.  Caller states he is returning a call from [place] regarding his lab results.

## 2019-06-02 NOTE — Telephone Encounter (Signed)
Spoke to pt thiamine and folic acid levels were low, pls confirm that he is taking thiamine 100mg  tablet daily? He is not taken it daily but will start Also he will need to take folic acid 1mg  daily, scripts sent in for both the thiamine and folic acid

## 2019-06-02 NOTE — Telephone Encounter (Signed)
Spoke with pt thiamine and folic acid levels were low, pls confirm that he is taking thiamine 100mg  tablet daily? not taken it daily.  Also he will need to take folic acid 1mg  daily.

## 2019-06-02 NOTE — Telephone Encounter (Signed)
Pt called no answer voice mail left for pt to call back 

## 2019-06-02 NOTE — Telephone Encounter (Signed)
-----   Message from Karen M Aquino, MD sent at 06/02/2019  9:56 AM EDT ----- Pls let him know his thiamine and folic acid levels were low, pls confirm that he is taking thiamine 100mg tablet daily? Also he will need to take folic acid 1mg daily. We can send Rx if needed, thanks 

## 2019-06-20 ENCOUNTER — Other Ambulatory Visit: Payer: Self-pay

## 2019-06-20 ENCOUNTER — Encounter (HOSPITAL_BASED_OUTPATIENT_CLINIC_OR_DEPARTMENT_OTHER): Payer: Self-pay

## 2019-06-20 ENCOUNTER — Emergency Department (HOSPITAL_BASED_OUTPATIENT_CLINIC_OR_DEPARTMENT_OTHER)
Admission: EM | Admit: 2019-06-20 | Discharge: 2019-06-20 | Disposition: A | Discharge status: Home / Self Care | Payer: BC Managed Care – PPO | Attending: Emergency Medicine | Admitting: Emergency Medicine

## 2019-06-20 ENCOUNTER — Emergency Department (HOSPITAL_BASED_OUTPATIENT_CLINIC_OR_DEPARTMENT_OTHER): Payer: BC Managed Care – PPO

## 2019-06-20 DIAGNOSIS — W228XXA Striking against or struck by other objects, initial encounter: Secondary | ICD-10-CM | POA: Insufficient documentation

## 2019-06-20 DIAGNOSIS — Z79899 Other long term (current) drug therapy: Secondary | ICD-10-CM | POA: Diagnosis not present

## 2019-06-20 DIAGNOSIS — F17210 Nicotine dependence, cigarettes, uncomplicated: Secondary | ICD-10-CM | POA: Insufficient documentation

## 2019-06-20 DIAGNOSIS — Y939 Activity, unspecified: Secondary | ICD-10-CM | POA: Insufficient documentation

## 2019-06-20 DIAGNOSIS — Z7901 Long term (current) use of anticoagulants: Secondary | ICD-10-CM | POA: Diagnosis not present

## 2019-06-20 DIAGNOSIS — Y999 Unspecified external cause status: Secondary | ICD-10-CM | POA: Insufficient documentation

## 2019-06-20 DIAGNOSIS — Y929 Unspecified place or not applicable: Secondary | ICD-10-CM | POA: Diagnosis not present

## 2019-06-20 DIAGNOSIS — S6992XA Unspecified injury of left wrist, hand and finger(s), initial encounter: Secondary | ICD-10-CM | POA: Insufficient documentation

## 2019-06-20 DIAGNOSIS — I10 Essential (primary) hypertension: Secondary | ICD-10-CM | POA: Diagnosis not present
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
## 2019-06-20 NOTE — Discharge Instructions (Addendum)
Take Tylenol 3 times a day for pain control. Use ice packs or heating pads if this helps control your pain. Use the finger splint or taping her middle and ring finger together to help support her finger as needed for pain control. Follow-up with your primary care doctor in 1 week if your pain is improving. Return to the emergency room if you develop severe worsening pain, numbness of your finger, color change of your finger, any new or worsening, concerning symptoms.
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
## 2019-06-22 ENCOUNTER — Other Ambulatory Visit: Payer: Self-pay

## 2019-06-22 MED ORDER — GABAPENTIN 300 MG PO CAPS: ORAL_CAPSULE | ORAL | 0 refills | Status: DC

## 2019-08-01 ENCOUNTER — Other Ambulatory Visit: Payer: Self-pay

## 2019-08-01 MED ORDER — GABAPENTIN 300 MG PO CAPS: ORAL_CAPSULE | ORAL | 0 refills | Status: DC

## 2019-08-30 ENCOUNTER — Other Ambulatory Visit: Payer: Self-pay | Admitting: Hematology & Oncology

## 2019-08-30 ENCOUNTER — Other Ambulatory Visit: Payer: Self-pay | Admitting: Neurology

## 2019-10-02 ENCOUNTER — Other Ambulatory Visit: Payer: Self-pay | Admitting: Medical

## 2019-10-02 ENCOUNTER — Other Ambulatory Visit: Payer: Self-pay | Admitting: Neurology

## 2019-10-03 ENCOUNTER — Other Ambulatory Visit: Payer: Self-pay | Admitting: Medical

## 2019-10-30 ENCOUNTER — Other Ambulatory Visit: Payer: Self-pay | Admitting: Medical

## 2019-10-30 ENCOUNTER — Other Ambulatory Visit: Payer: Self-pay | Admitting: Neurology

## 2019-11-03 ENCOUNTER — Other Ambulatory Visit: Payer: Self-pay | Admitting: Medical

## 2019-12-08 ENCOUNTER — Other Ambulatory Visit: Payer: Self-pay | Admitting: Medical

## 2019-12-12 ENCOUNTER — Telehealth: Payer: Self-pay | Admitting: Medical

## 2019-12-12 ENCOUNTER — Other Ambulatory Visit: Payer: Self-pay | Admitting: Medical

## 2019-12-12 MED ORDER — CARVEDILOL 12.5 MG PO TABS: ORAL_TABLET | ORAL | 0 refills | Status: DC

## 2019-12-12 NOTE — Telephone Encounter (Signed)
Med sent.

## 2019-12-12 NOTE — Telephone Encounter (Signed)
Medication: carvedilol (COREG) 12.5 MG tablet    Has the patient contacted their pharmacy? No. (If no, request that the patient contact the pharmacy for the refill.) (If yes, when and what did the pharmacy advise?)  Preferred Pharmacy (with phone number or street name): [id number] [place], [place] - [address] AT [facility] [address]  [address], [place] [address]  Phone:  [phone] Fax:  [phone]  DEA #:  [id number]  Agent: Please be advised that RX refills may take up to 3 business days. We ask that you follow-up with your pharmacy.

## 2019-12-16 ENCOUNTER — Encounter: Payer: BC Managed Care – PPO | Admitting: Medical

## 2019-12-16 ENCOUNTER — Other Ambulatory Visit: Payer: Self-pay

## 2019-12-20 ENCOUNTER — Telehealth (INDEPENDENT_AMBULATORY_CARE_PROVIDER_SITE_OTHER): Payer: BC Managed Care – PPO | Admitting: Medical

## 2019-12-20 ENCOUNTER — Encounter: Payer: Self-pay | Admitting: Medical

## 2019-12-20 ENCOUNTER — Other Ambulatory Visit: Payer: Self-pay

## 2019-12-20 VITALS — BP 128/78 | HR 95

## 2019-12-20 DIAGNOSIS — I10 Essential (primary) hypertension: Secondary | ICD-10-CM | POA: Diagnosis not present

## 2019-12-20 DIAGNOSIS — Z86718 Personal history of other venous thrombosis and embolism: Secondary | ICD-10-CM

## 2019-12-20 DIAGNOSIS — R7989 Other specified abnormal findings of blood chemistry: Secondary | ICD-10-CM | POA: Diagnosis not present

## 2019-12-20 DIAGNOSIS — R252 Cramp and spasm: Secondary | ICD-10-CM

## 2019-12-20 DIAGNOSIS — F1010 Alcohol abuse, uncomplicated: Secondary | ICD-10-CM

## 2019-12-20 DIAGNOSIS — R739 Hyperglycemia, unspecified: Secondary | ICD-10-CM

## 2019-12-20 DIAGNOSIS — G629 Polyneuropathy, unspecified: Secondary | ICD-10-CM

## 2019-12-20 MED ORDER — CARVEDILOL 12.5 MG PO TABS: ORAL_TABLET | ORAL | 3 refills | Status: DC

## 2019-12-20 NOTE — Patient Instructions (Signed)
For history of hypertension, continue on current Coreg prescription.  Important that you check your blood pressure to make sure BP well controlled.  When you do check recommend that you check at 3 consecutive readings 5 minutes apart.  If BP consistently above 140/90 then let us know and we will need need to make BP med adjustment.  However presently on repeat blood pressure was well controlled.  For history of neuropathy, continue with B vitamins and recommend continue to drink minimal alcohol/abstain completely if possible.  Also eat low sugar diet and will repeat A1c on future labs make sure not developing diabetes Beatties.  History of increased liver enzymes.  Will place future metabolic panel and CBC.  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  Some intermittent lower extremity cramping.  Will follow potassium level on metabolic panel and include magnesium level.  Follow-up in 3 months or as needed.

## 2019-12-20 NOTE — Progress Notes (Signed)
° °  Subjective:    Patient ID: Samuel Cantrell, male    DOB: [birth year], 44 y.o.   MRN: [id number]  HPI  Virtual Visit via Telephone Note  I connected with Samuel Cantrell on 12/20/19 at 11:20 AM EDT by telephone and verified that I am speaking with the correct person using two identifiers.  Location: Patient: [place] in [place]. He states poor connection so video link fialed. Provider: office Participants- pt and myself.   I discussed the limitations, risks, security and privacy concerns of performing an evaluation and management service by telephone and the availability of in person appointments. I also discussed with the patient that there may be a patient responsible charge related to this service. The patient expressed understanding and agreed to proceed.   History of Present Illness: Pt has history of htn. Pt is on  Coreg. Pt has been having consistent mild elevations. Pt has hx of some low blood pressure readings with various med.    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 So Dr. Myna Hidalgo put him back on xarelto.  Pt had mild sugar elevation in the past.  Pt had low b12 and b1 vitamin in the past.  Pt states he is drinking much less alcohol now.  Occasional mild cramping in calfs.    Observations/Objective: General- no acute distress, pleasant, alert and oriented.   Assessment and Plan: For history of hypertension, continue on current Coreg prescription.  Important that you check your blood pressure to make sure BP well controlled.  When you do check recommend that you check at 3 consecutive readings 5 minutes apart.  If BP consistently above 140/90 then let us know and we will need need to make BP med adjustment.  However presently on repeat blood pressure was well controlled.  For history of neuropathy, continue with B vitamins and recommend continue to drink minimal alcohol/abstain completely if possible.  Also eat low sugar diet and will repeat A1c  on future labs make sure not developing diabetes Beatties.  History of increased liver enzymes.  Will place future metabolic panel and CBC.  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  Some intermittent lower extremity cramping.  Will follow potassium level on metabolic panel and include magnesium level.  Follow-up in 3 months or as needed.  Follow Up Instructions:    I discussed the assessment and treatment plan with the patient. The patient was provided an opportunity to ask questions and all were answered. The patient agreed with the plan and demonstrated an understanding of the instructions.   The patient was advised to call back or seek an in-person evaluation if the symptoms worsen or if the condition fails to improve as anticipated.  Time spent with patient today was 40  minutes which consisted of chart review, discussing diagnosis, work up treatment and documentation.   Esperanza Richters, PA-C    Review of Systems     Objective:   Physical Exam        Assessment & Plan:

## 2019-12-21 ENCOUNTER — Ambulatory Visit (INDEPENDENT_AMBULATORY_CARE_PROVIDER_SITE_OTHER): Payer: BC Managed Care – PPO | Admitting: Neurology

## 2019-12-21 ENCOUNTER — Other Ambulatory Visit: Payer: Self-pay

## 2019-12-21 ENCOUNTER — Encounter: Payer: Self-pay | Admitting: Neurology

## 2019-12-21 VITALS — BP 102/65 | HR 95 | Ht 67.0 in | Wt 117.4 lb

## 2019-12-21 DIAGNOSIS — G629 Polyneuropathy, unspecified: Secondary | ICD-10-CM | POA: Diagnosis not present

## 2019-12-21 DIAGNOSIS — R569 Unspecified convulsions: Secondary | ICD-10-CM | POA: Diagnosis not present

## 2019-12-21 MED ORDER — VITAMIN B-6 250 MG PO TABS: 250.0000 mg | ORAL_TABLET | Freq: Every day | ORAL | 3 refills | Status: DC

## 2019-12-21 MED ORDER — GABAPENTIN 300 MG PO CAPS: ORAL_CAPSULE | ORAL | 11 refills | Status: DC

## 2019-12-21 MED ORDER — NORTRIPTYLINE HCL 10 MG PO CAPS: 10.0000 mg | ORAL_CAPSULE | Freq: Every day | ORAL | 11 refills | Status: DC

## 2019-12-21 MED ORDER — FOLIC ACID 1 MG PO TABS: ORAL_TABLET | ORAL | 3 refills | Status: DC

## 2019-12-21 MED ORDER — THIAMINE HCL 100 MG PO TABS: 100.0000 mg | ORAL_TABLET | Freq: Every day | ORAL | 3 refills | Status: DC

## 2019-12-21 NOTE — Patient Instructions (Signed)
1. Continue Gabapentin 300mg : Take 3 caps in AM, 4 caps in PM  2. Add on nortriptyline 10mg : take 1 capsule every night  3. Continue B vitamins  4. Continue working on alcohol reduction, alcohol can also cause and worsen neuropathy  5. Follow-up in 6-8 months, call for any changes

## 2019-12-21 NOTE — Progress Notes (Signed)
NEUROLOGY FOLLOW UP OFFICE NOTE  Samuel Cantrell [id number] February 16, 1976  HISTORY OF PRESENT ILLNESS: I had the pleasure of seeing Samuel Cantrell in follow-up in the neurology clinic on 12/21/2019.  The patient was last seen 7 months ago for new onset seizure (likely alcohol withdrawal) and neuropathy. No further seizures since June 2020. He is taking Gabapentin for neuropathy, he self-increased dose to 900mg  in AM, 1200mg  in PM with no side effects. It helps with the pain, however it does not help with the numbness. He feels like he needs more medication to help. When his shoes are off, balance is worse. Hands are unaffected, symptoms are from the knees down to his feet. He denies any falls. His B1 and folate levels were low in March 2021, he continues on daily supplements. He denies any seizures since June 2020, no staring/unresponsive episodes, gaps in time, myoclonic jerks. Sleep is good. He reports cutting down a lot on alcohol, he has 1-2 cans of "the fruity drinks" daily.    History on Initial Assessment 11/11/2018: This is a 44 year old right-handed man with a history of hypertension, recurrent DVT, protein C deficiency, alcohol abuse, presenting for evaluation of neuropathy. He also recently had a new onset seizure felt secondary to alcohol withdrawal. He reports symptoms started around 2 months ago with back pain, records indicate this occurred in May 2020. He started having back pain going down his legs with numbness and tingling. The back pain has resolved however the paresthesias continue below his knees down to his toes. Every once in a while there is a pain like sticking with a pin. Hands are not affected. He loses his balance a little. He has urinary frequency, no bowel/bladder incontinence. He denies any falls. No family history of neuropathy. He was seen by Sports Medicine and had tried low dose gabapentin 100mg  qhs with no effect.   He was admitted to [facility] in [facility]  last 09/04/2018 for seizures felt secondary to alcohol withdrawal. His wife reports he came out of the bathroom and sat on the couch, then she heard a loud yell and found him with body tensed up, he had fallen off the couch. She reports he stopped breathing for a few minutes and she performed CPR, during which time his eyes opened and he could answer questions but did not remember what happened. He did not realize he had another GTC in the ER. CT head no acute changes. Per report he normally drinks 2-3 shots per day (today he reports 5-6 shots a day) then stopped abruptly few days prior. He had severe hypokalemia and hypomagnesemia, low B12, B1, and folate levels. He has been on replacement therapy since then. His wife reports he was very disoriented, he was zoning out when he first got home, this has not occurred since. She denies any staring/unresponsive episodes, he denies any other gaps in time, olfactory/gustatory hallucinations, deja vu, rising epigastric sensation, focal weakness. He has occasional jerking of his legs. He states he has cut down on alcohol, he is not drinking vodka any longer but still drinks 3 beers daily. He feels dizzy if he stands for a prolonged period, his BP drops to 90/50.  He had a normal birth and early development.  There is no history of febrile convulsions, CNS infections such as meningitis/encephalitis, significant traumatic brain injury, neurosurgical procedures, or family history of seizures.   PAST MEDICAL HISTORY: Past Medical History:  Diagnosis Date  . Allergy   .  GERD (gastroesophageal reflux disease)   . Hypertensive urgency 02/28/2016    MEDICATIONS: Current Outpatient Medications on File Prior to Visit  Medication Sig Dispense Refill  . carvedilol (COREG) 12.5 MG tablet TAKE 1 TABLET(12.5 MG) BY MOUTH TWICE DAILY WITH A MEAL 180 tablet 3  . diphenhydrAMINE (BENADRYL) 25 MG tablet Take 25 mg by mouth every 6 (six) hours as needed for itching or allergies.      . folic acid (FOLVITE) 1 MG tablet TAKE 1 TABLET(1 MG) BY MOUTH DAILY 90 tablet 0  . gabapentin (NEURONTIN) 300 MG capsule TAKE 2 CAPSULES BY MOUTH IN THE MORNINGS AND 3 IN THE EVENINGS 150 capsule 5  . potassium chloride SA (K-DUR) 20 MEQ tablet 1 tab po every other day 15 tablet 3  . Pyridoxine HCl (VITAMIN B-6) 250 MG tablet Take 1 tablet (250 mg total) by mouth daily. 30 tablet 8  . XARELTO 10 MG TABS tablet TAKE 1 TABLET(10 MG) BY MOUTH DAILY 30 tablet 6   No current facility-administered medications on file prior to visit.    ALLERGIES: No Known Allergies  FAMILY HISTORY: Family History  Problem Relation Age of Onset  . Hypertension Mother     SOCIAL HISTORY: Social History   Socioeconomic History  . Marital status: Married    Spouse name: Not on file  . Number of children: Not on file  . Years of education: Not on file  . Highest education level: Not on file  Occupational History  . Occupation: Astronomer  Tobacco Use  . Smoking status: Current Every Day Smoker    Packs/day: 0.50    Years: 8.00    Pack years: 4.00    Types: Cigarettes  . Smokeless tobacco: Never Used  Vaping Use  . Vaping Use: Never used  Substance and Sexual Activity  . Alcohol use: Yes    Comment: occ  . Drug use: No  . Sexual activity: Not on file  Other Topics Concern  . Not on file  Social History Narrative   Pt lives with wife.   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 Difficulty of Paying Living Expenses: Not on file  Food Insecurity:   . Worried About Programme researcher, broadcasting/film/video in the Last Year: Not on file  . Ran Out of Food in the Last Year: Not on file  Transportation Needs:   . Lack of Transportation (Medical): Not on file  . Lack of Transportation (Non-Medical): Not on file  Physical Activity:   . Days of Exercise per Week: Not on file  . Minutes of Exercise per Session: Not on file   Stress:   . Feeling of Stress : Not on file  Social Connections:   . Frequency of Communication with Friends and Family: Not on file  . Frequency of Social Gatherings with Friends and Family: Not on file  . Attends Religious Services: Not on file  . Active Member of Clubs or Organizations: Not on file  . Attends Banker Meetings: Not on file  . Marital Status: Not on file  Intimate Partner Violence:   . Fear of Current or Ex-Partner: Not on file  . Emotionally Abused: Not on file  . Physically Abused: Not on file  . Sexually Abused: Not on file     PHYSICAL EXAM: Vitals:   12/21/19 1457  BP: 102/65  Pulse: 95  SpO2: 98%   General: No acute distress Head:  Normocephalic/atraumatic Skin/Extremities: No rash, no edema Neurological Exam: alert and awake. No aphasia or dysarthria. Fund of knowledge is appropriate.  Recent and remote memory are intact.  Attention and concentration are normal.   Cranial nerves: Pupils equal, round. Extraocular movements intact with no nystagmus. Visual fields full.  No facial asymmetry.  Motor: Bulk and tone normal, muscle strength 5/5 throughout with no pronator drift. Unable to elicit reflexes throughout.  Finger to nose testing intact.  Gait narrow-based and steady, able to tandem walk better today. Negative Romberg today.    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 He has needed higher dose gabapentin, now on 900mg  in AM, 1200mg  in PM and asks about another medication. Low dose nortriptyline 10mg  qhs will be added, side effects discussed. Continue B1 and folic acid supplements. He was again encouraged to continue with alcohol cessation. He has not had any seizures since June 2020, likely secondary to alcohol withdrawal. He is aware of [place] driving laws to stop driving after a seizure until 6 months  seizure-free. Follow-up in 6-8 months, he knows to call for any changes.   Thank you for allowing me to participate in his care.  Please do not hesitate to call for any questions or concerns.   , M.D.   CC: , PA-C, Dr. [id number]

## 2020-01-01 ENCOUNTER — Other Ambulatory Visit: Payer: Self-pay | Admitting: Medical

## 2020-01-03 ENCOUNTER — Other Ambulatory Visit: Payer: Self-pay

## 2020-01-03 ENCOUNTER — Other Ambulatory Visit (INDEPENDENT_AMBULATORY_CARE_PROVIDER_SITE_OTHER): Payer: BC Managed Care – PPO

## 2020-01-03 DIAGNOSIS — R739 Hyperglycemia, unspecified: Secondary | ICD-10-CM | POA: Diagnosis not present

## 2020-01-03 DIAGNOSIS — I10 Essential (primary) hypertension: Secondary | ICD-10-CM

## 2020-01-03 DIAGNOSIS — R252 Cramp and spasm: Secondary | ICD-10-CM

## 2020-01-03 DIAGNOSIS — G629 Polyneuropathy, unspecified: Secondary | ICD-10-CM

## 2020-01-04 ENCOUNTER — Telehealth: Payer: Self-pay | Admitting: Medical

## 2020-01-04 DIAGNOSIS — E876 Hypokalemia: Secondary | ICD-10-CM

## 2020-01-04 DIAGNOSIS — R790 Abnormal level of blood mineral: Secondary | ICD-10-CM

## 2020-01-04 LAB — COMPLETE METABOLIC PANEL WITH GFR
AG Ratio: 0.9 (calc) — ABNORMAL LOW (ref 1.0–2.5)
ALT: 61 U/L — ABNORMAL HIGH (ref 9–46)
AST: 123 U/L — ABNORMAL HIGH (ref 10–40)
Albumin: 3.6 g/dL (ref 3.6–5.1)
Alkaline phosphatase (APISO): 221 U/L — ABNORMAL HIGH (ref 36–130)
BUN: 9 mg/dL (ref 7–25)
CO2: 30 mmol/L (ref 20–32)
Calcium: 8.7 mg/dL (ref 8.6–10.3)
Chloride: 100 mmol/L (ref 98–110)
Creat: 0.66 mg/dL (ref 0.60–1.35)
GFR, Est African American: 134 mL/min/{1.73_m2} (ref >=60)
GFR, Est Non African American: 116 mL/min/{1.73_m2} (ref >=60)
Globulin: 3.9 g/dL (calc) — ABNORMAL HIGH (ref 1.9–3.7)
Glucose, Bld: 110 mg/dL — ABNORMAL HIGH (ref 65–99)
Potassium: 3.3 mmol/L — ABNORMAL LOW (ref 3.5–5.3)
Sodium: 139 mmol/L (ref 135–146)
Total Bilirubin: 0.8 mg/dL (ref 0.2–1.2)
Total Protein: 7.5 g/dL (ref 6.1–8.1)

## 2020-01-04 LAB — HEMOGLOBIN A1C
Hgb A1c MFr Bld: 4.5 % of total Hgb (ref <5.7)
Mean Plasma Glucose: 82 (calc)
eAG (mmol/L): 4.6 (calc)

## 2020-01-04 LAB — CBC WITH DIFFERENTIAL/PLATELET
Absolute Monocytes: 706 cells/uL (ref 200–950)
Basophils Absolute: 79 cells/uL (ref 0–200)
Basophils Relative: 1.2 %
Eosinophils Absolute: 119 cells/uL (ref 15–500)
Eosinophils Relative: 1.8 %
HCT: 37.1 % — ABNORMAL LOW (ref 38.5–50.0)
Hemoglobin: 13 g/dL — ABNORMAL LOW (ref 13.2–17.1)
Lymphs Abs: 3148 cells/uL (ref 850–3900)
MCH: 33.2 pg — ABNORMAL HIGH (ref 27.0–33.0)
MCHC: 35 g/dL (ref 32.0–36.0)
MCV: 94.9 fL (ref 80.0–100.0)
MPV: 13.5 fL — ABNORMAL HIGH (ref 7.5–12.5)
Monocytes Relative: 10.7 %
Neutro Abs: 2548 cells/uL (ref 1500–7800)
Neutrophils Relative %: 38.6 %
Platelets: 81 10*3/uL — ABNORMAL LOW (ref 140–400)
RBC: 3.91 10*6/uL — ABNORMAL LOW (ref 4.20–5.80)
RDW: 14.5 % (ref 11.0–15.0)
Total Lymphocyte: 47.7 %
WBC: 6.6 10*3/uL (ref 3.8–10.8)

## 2020-01-04 LAB — MAGNESIUM: Magnesium: 1.3 mg/dL — ABNORMAL LOW (ref 1.5–2.5)

## 2020-01-04 LAB — VITAMIN B12: Vitamin B-12: 1412 pg/mL — ABNORMAL HIGH (ref 200–1100)

## 2020-01-04 NOTE — Telephone Encounter (Signed)
Future lab placed  

## 2020-01-06 ENCOUNTER — Telehealth: Payer: Self-pay | Admitting: Medical

## 2020-01-06 NOTE — Telephone Encounter (Signed)
Medication:  potassium chloride SA (K-DUR) 20 MEQ tablet [[id number]]      Has the patient contacted their pharmacy?  (If no, request that the patient contact the pharmacy for the refill.) (If yes, when and what did the pharmacy advise?)     Preferred Pharmacy (with phone number or street name):  [id number] [place] [address], [place] - [address] AT [facility] [address]  [address], [place] [address]  Phone:  [phone] Fax:  [phone]     Agent: Please be advised that RX refills may take up to 3 business days. We ask that you follow-up with your pharmacy.

## 2020-01-06 NOTE — Telephone Encounter (Signed)
Refill okay ? Or did you want to check his magnesium  levels again

## 2020-01-07 LAB — VITAMIN B1: Vitamin B1 (Thiamine): 17 nmol/L (ref 8–30)

## 2020-01-07 MED ORDER — POTASSIUM CHLORIDE CRYS ER 20 MEQ PO TBCR: EXTENDED_RELEASE_TABLET | ORAL | 0 refills | Status: DC

## 2020-01-07 NOTE — Telephone Encounter (Signed)
I did sent in 15 tab prescription. No refills. But he does need to be scheduled for the labs that I ordered. cmp and mg level. Very important as I don't know waht his potassium level is. Also mg might be the issue/reason for cramps.

## 2020-01-08 ENCOUNTER — Other Ambulatory Visit: Payer: Self-pay | Admitting: Medical

## 2020-01-09 ENCOUNTER — Telehealth: Payer: Self-pay | Admitting: Medical

## 2020-01-09 NOTE — Telephone Encounter (Signed)
Pt notified and he stated he will call back for CPE

## 2020-01-09 NOTE — Telephone Encounter (Signed)
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 wants to know if labs are needed for the refill told him yes.

## 2020-01-09 NOTE — Telephone Encounter (Signed)
Labs would be necessary. Did give him 2 week supply. He also will need to follow up in office occasionally. Last visit was done virtually. Occasional office visit can be virtual. But he does need to actually come in office on occasion. Depending on type of complaint.  For example cpe/wellness exam will need to be scheduled in next 3 months or so.

## 2020-01-09 NOTE — Telephone Encounter (Signed)
Opened to review 

## 2020-04-01 ENCOUNTER — Other Ambulatory Visit: Payer: Self-pay | Admitting: Hematology & Oncology

## 2020-06-04 IMAGING — US US EXTREM  UP VENOUS*L*
1 series · 13 of 24 positions shown · non-contrast
Comparison: 10/29/2018

CLINICAL DATA: History of left basilic and brachial DVT.



[Series 1: us extrem up venous*left* · 13 of 25 slices shown]
[im 1/25]
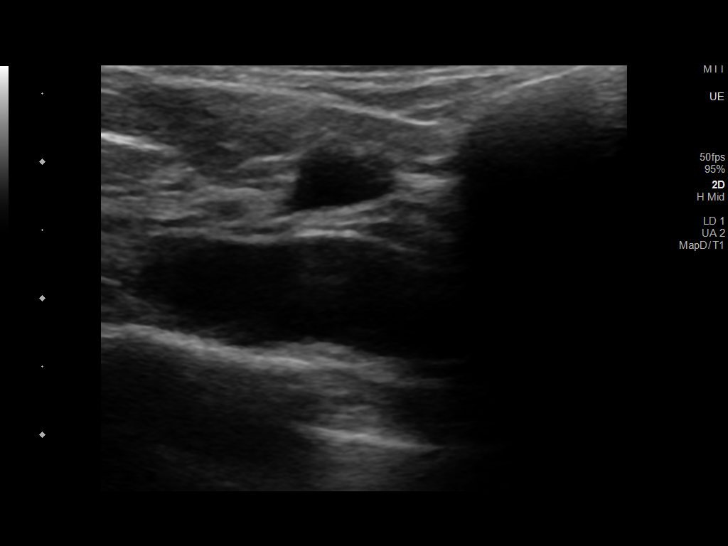
[im 3/25]
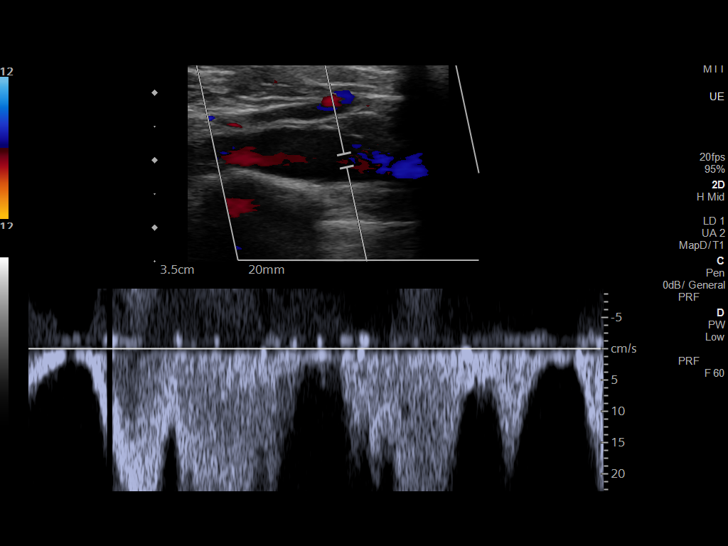
[im 5/25]
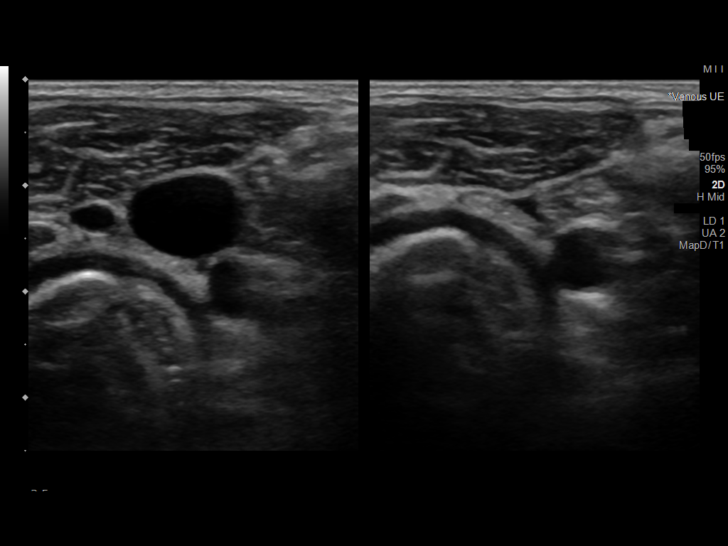
[im 7/25]
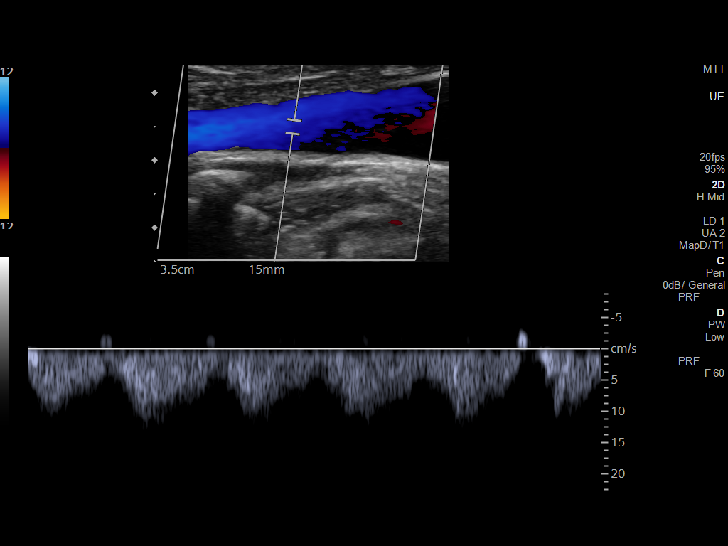
[im 9/25]
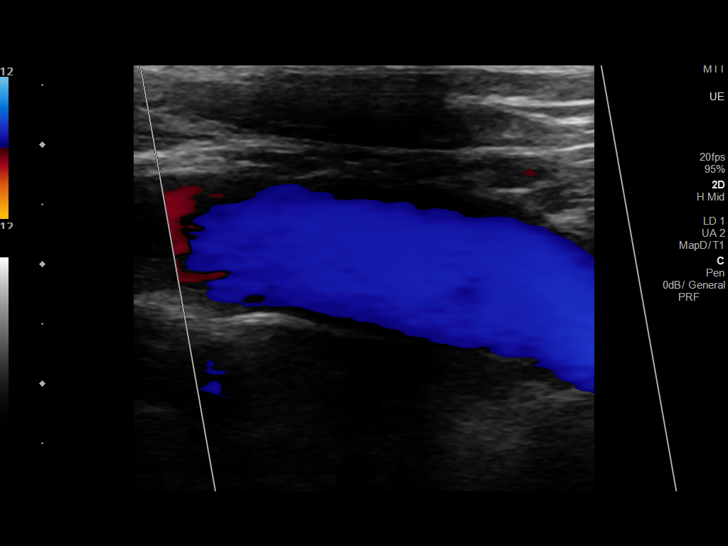
[im 11/25]
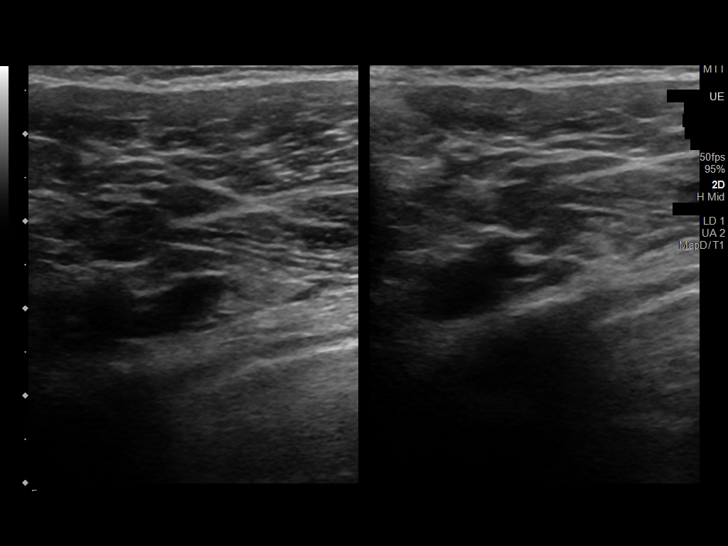
[im 13/25]
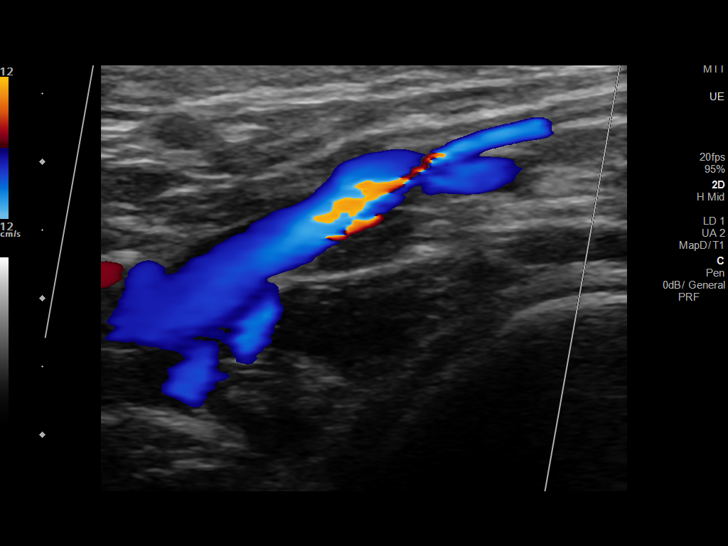
[im 14/25]
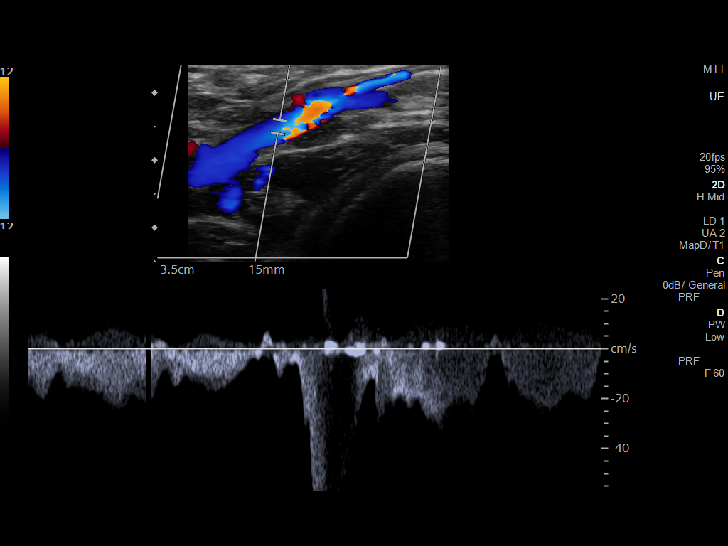
[im 16/25]
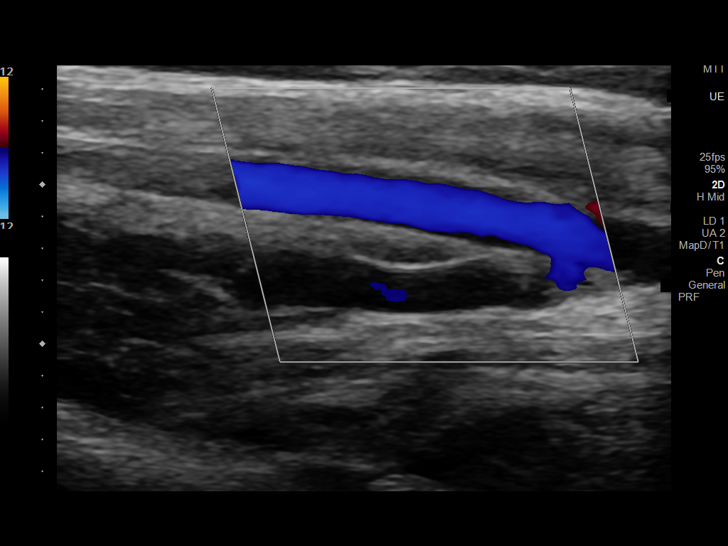
[im 18/25]
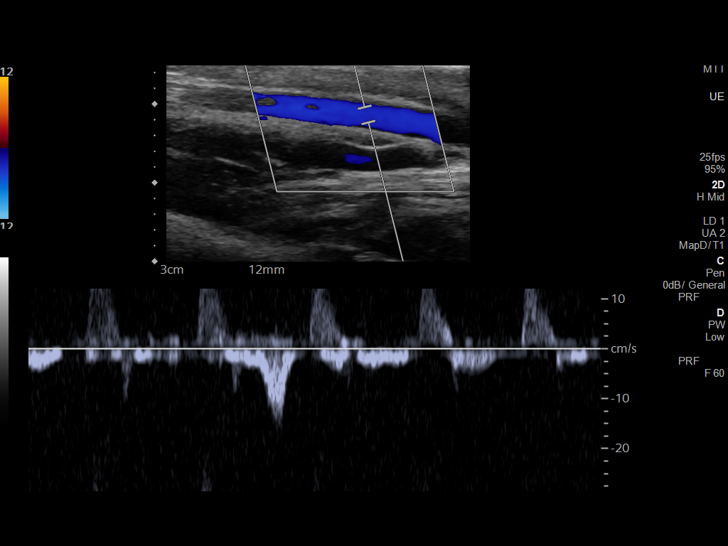
[im 20/25]
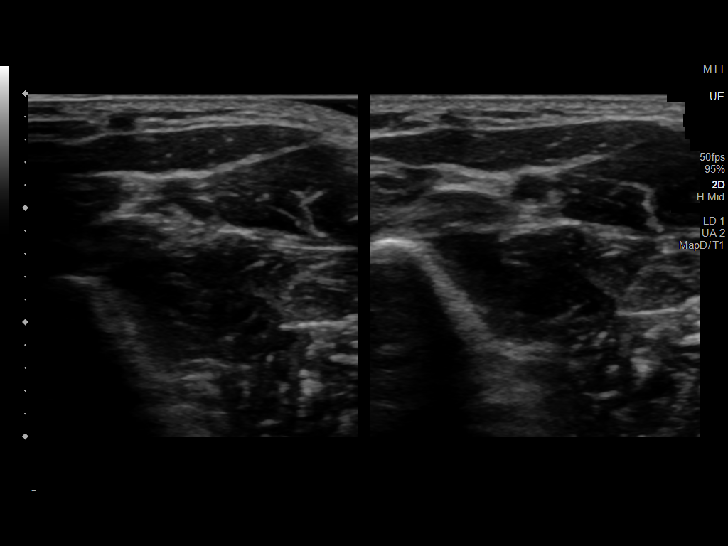
[im 22/25]
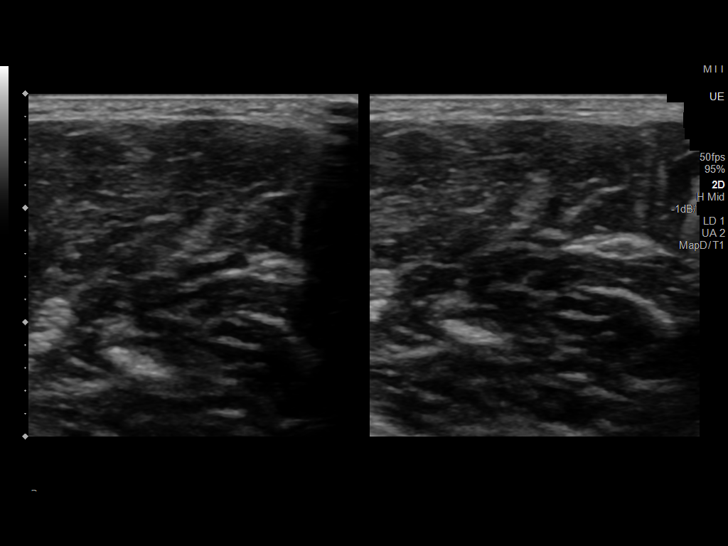
[im 25/25]
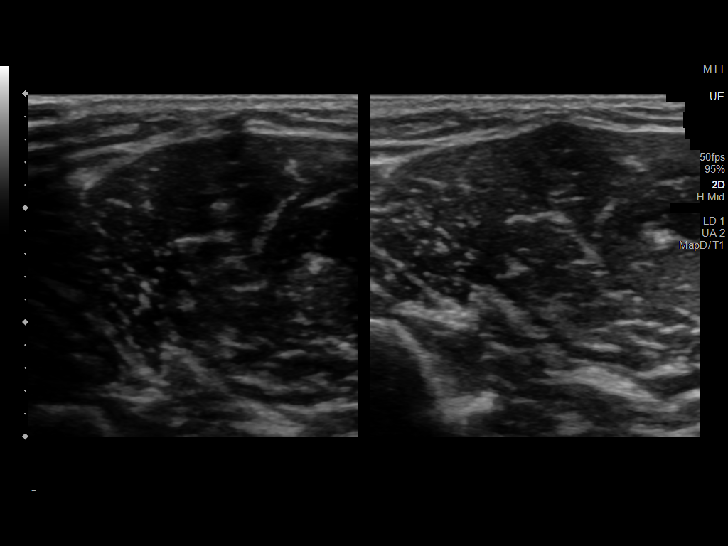

[13 of 24 positions shown; findings below may reference images not displayed]

FINDINGS: Contralateral Subclavian Vein: Respiratory phasicity is normal and
symmetric with the symptomatic side. No evidence of thrombus. Normal
compressibility.

Internal Jugular Vein: No evidence of thrombus. Normal
compressibility, respiratory phasicity and response to augmentation.

Subclavian Vein: No evidence of thrombus. Normal compressibility,
respiratory phasicity and response to augmentation.

Axillary Vein: No evidence of thrombus. Normal compressibility,
respiratory phasicity and response to augmentation.

Cephalic Vein: No evidence of thrombus. Normal compressibility,
respiratory phasicity and response to augmentation.

Basilic Vein: No evidence of thrombus. Normal compressibility,
respiratory phasicity and response to augmentation.

Brachial Veins: No evidence of thrombus. Normal compressibility,
respiratory phasicity and response to augmentation.

Radial Veins: No evidence of thrombus. Normal compressibility,
respiratory phasicity and response to augmentation.

Ulnar Veins: No evidence of thrombus. Normal compressibility,
respiratory phasicity and response to augmentation.

Venous Reflux:  None visualized.

Other Findings:  None visualized.
IMPRESSION: 1. Interval resolution of left upper extremity DVT. No evidence of
residual/recurrent thrombus.

## 2020-06-16 ENCOUNTER — Other Ambulatory Visit: Payer: Self-pay | Admitting: Medical

## 2020-06-24 IMAGING — NM NM HEPATOBILIARY IMAGE, INC GB
1 series · 6 of 6 positions shown · non-contrast
Comparison: CT 12/01/2018

CLINICAL DATA: Right upper quadrant pain

EXAM:
NUCLEAR MEDICINE HEPATOBILIARY IMAGING
TECHNIQUE: Sequential images of the abdomen were obtained [DATE] minutes
following intravenous administration of radiopharmaceutical.
RADIOPHARMACEUTICALS:  5.1 mCi Mc-CCm  Choletec IV

[Series 1: biliary · 3.25mm/px · 6 of 60 frames shown]
[frame 6/60]
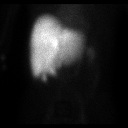
[frame 16/60]
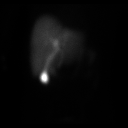
[frame 26/60]
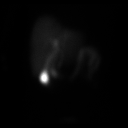
[frame 36/60]
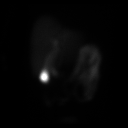
[frame 46/60]
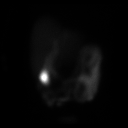
[frame 56/60]
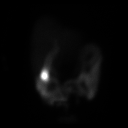

[6 of 6 positions shown; findings below may reference images not displayed]

FINDINGS: Prompt uptake and biliary excretion of activity by the liver is
seen. Gallbladder activity is visualized, consistent with patency of
cystic duct. Biliary activity passes into small bowel, consistent
with patent common bile duct.
IMPRESSION: Negative examination

## 2020-08-09 ENCOUNTER — Ambulatory Visit: Payer: BC Managed Care – PPO | Admitting: Neurology

## 2020-08-15 ENCOUNTER — Ambulatory Visit (INDEPENDENT_AMBULATORY_CARE_PROVIDER_SITE_OTHER): Payer: BC Managed Care – PPO | Admitting: Neurology

## 2020-08-15 ENCOUNTER — Other Ambulatory Visit: Payer: Self-pay

## 2020-08-15 ENCOUNTER — Encounter: Payer: Self-pay | Admitting: Neurology

## 2020-08-15 VITALS — BP 166/97 | HR 79 | Ht 68.0 in | Wt 122.2 lb

## 2020-08-15 DIAGNOSIS — G629 Polyneuropathy, unspecified: Secondary | ICD-10-CM | POA: Diagnosis not present

## 2020-08-15 DIAGNOSIS — R569 Unspecified convulsions: Secondary | ICD-10-CM | POA: Diagnosis not present

## 2020-08-15 MED ORDER — NORTRIPTYLINE HCL 10 MG PO CAPS: 10.0000 mg | ORAL_CAPSULE | Freq: Every day | ORAL | 11 refills | Status: DC

## 2020-08-15 MED ORDER — GABAPENTIN 300 MG PO CAPS: ORAL_CAPSULE | ORAL | 11 refills | Status: DC

## 2020-08-15 NOTE — Progress Notes (Signed)
NEUROLOGY FOLLOW UP OFFICE NOTE  OSIEL STICK [id number] 1975/03/18  HISTORY OF PRESENT ILLNESS: I had the pleasure of seeing Haaris Metallo in follow-up in the neurology clinic on 08/15/2020. His wife was on speakerphone to provide additional information. The patient was last seen 8 months ago for new onset seizure in 08/2018 (likely alcohol withdrawal) and neuropathy. He has been seizure-free since 2020, he and his wife deny any seizures or seizure-like symptoms, no staring/unresponsive episodes. His main concern has been the neuropathy, on his last visit, nortriptyline 10mg  qhs was added to gabapentin 900mg  in AM, 1200mg  in PM. B1 and folate levels were low in March 2021, he is taking daily supplements. He feels neuropathy is unchanged, he mostly notices it when he gets home from work and his legs are tight with cramps in the balls of his feet. He has constant tingling and burning in a stocking distribution to his knees. Back pain is only every once in a while, better than before. No bowel/bladder dysfunction. No falls. Sleep is good. He continues to drink vodka on a daily basis. His wife is concerned about his memory, he can be repetitive, she has to tell him the same things a couple of times in a row. She has showed him the carbon monoxide detector 5 times and he still does not remember. He denies getting lost driving. He denies missing medications, only occurring when he is rushing to work.   History on Initial Assessment 11/11/2018: This is a 45 year old right-handed man with a history of hypertension, recurrent DVT, protein C deficiency, alcohol abuse, presenting for evaluation of neuropathy. He also recently had a new onset seizure felt secondary to alcohol withdrawal. He reports symptoms started around 2 months ago with back pain, records indicate this occurred in May 2020. He started having back pain going down his legs with numbness and tingling. The back pain has resolved however the  paresthesias continue below his knees down to his toes. Every once in a while there is a pain like sticking with a pin. Hands are not affected. He loses his balance a little. He has urinary frequency, no bowel/bladder incontinence. He denies any falls. No family history of neuropathy. He was seen by Sports Medicine and had tried low dose gabapentin 100mg  qhs with no effect.   He was admitted to [facility] in [facility] last 09/04/2018 for seizures felt secondary to alcohol withdrawal. His wife reports he came out of the bathroom and sat on the couch, then she heard a loud yell and found him with body tensed up, he had fallen off the couch. She reports he stopped breathing for a few minutes and she performed CPR, during which time his eyes opened and he could answer questions but did not remember what happened. He did not realize he had another GTC in the ER. CT head no acute changes. Per report he normally drinks 2-3 shots per day (today he reports 5-6 shots a day) then stopped abruptly few days prior. He had severe hypokalemia and hypomagnesemia, low B12, B1, and folate levels. He has been on replacement therapy since then. His wife reports he was very disoriented, he was zoning out when he first got home, this has not occurred since. She denies any staring/unresponsive episodes, he denies any other gaps in time, olfactory/gustatory hallucinations, deja vu, rising epigastric sensation, focal weakness. He has occasional jerking of his legs. He states he has cut down on alcohol, he is not drinking vodka  any longer but still drinks 3 beers daily. He feels dizzy if he stands for a prolonged period, his BP drops to 90/50.  He had a normal birth and early development.  There is no history of febrile convulsions, CNS infections such as meningitis/encephalitis, significant traumatic brain injury, neurosurgical procedures, or family history of seizures.   PAST MEDICAL HISTORY: Past Medical History:  Diagnosis Date   . Allergy   . GERD (gastroesophageal reflux disease)   . Hypertensive urgency 02/28/2016    MEDICATIONS: Current Outpatient Medications on File Prior to Visit  Medication Sig Dispense Refill  . carvedilol (COREG) 12.5 MG tablet TAKE 1 TABLET(12.5 MG) BY MOUTH TWICE DAILY WITH A MEAL 180 tablet 3  . diphenhydrAMINE (BENADRYL) 25 MG tablet Take 25 mg by mouth every 6 (six) hours as needed for itching or allergies.     . folic acid (FOLVITE) 1 MG tablet TAKE 1 TABLET(1 MG) BY MOUTH DAILY 90 tablet 3  . gabapentin (NEURONTIN) 300 MG capsule Take 3 caps in AM, 4 caps in PM 210 capsule 11  . nortriptyline (PAMELOR) 10 MG capsule Take 1 capsule (10 mg total) by mouth at bedtime. 30 capsule 11  . potassium chloride SA (KLOR-CON) 20 MEQ tablet 1 tab po every other day 15 tablet 0  . thiamine 100 MG tablet Take 1 tablet (100 mg total) by mouth daily. 90 tablet 3  . XARELTO 10 MG TABS tablet TAKE 1 TABLET(10 MG) BY MOUTH DAILY 30 tablet 6   No current facility-administered medications on file prior to visit.    ALLERGIES: No Known Allergies  FAMILY HISTORY: Family History  Problem Relation Age of Onset  . Hypertension Mother     SOCIAL HISTORY: Social History   Socioeconomic History  . Marital status: Married    Spouse name: Not on file  . Number of children: Not on file  . Years of education: Not on file  . Highest education level: Not on file  Occupational History  . Occupation: Astronomer  Tobacco Use  . Smoking status: Current Every Day Smoker    Packs/day: 0.50    Years: 8.00    Pack years: 4.00    Types: Cigarettes  . Smokeless tobacco: Never Used  Vaping Use  . Vaping Use: Never used  Substance and Sexual Activity  . Alcohol use: Yes    Comment: occ  . Drug use: No  . Sexual activity: Not on file  Other Topics Concern  . Not on file  Social History Narrative   Pt lives with wife.   Right handed   Highest level of edu- 3 years of colleg   Social  Determinants of Health   Financial Resource Strain: Not on file  Food Insecurity: Not on file  Transportation Needs: Not on file  Physical Activity: Not on file  Stress: Not on file  Social Connections: Not on file  Intimate Partner Violence: Not on file     PHYSICAL EXAM: Vitals:   08/15/20 1001  BP: (!) 166/97  Pulse: 79  SpO2: 99%   General: No acute distress Head:  Normocephalic/atraumatic Skin/Extremities: No rash, no edema Neurological Exam: alert and awake. No aphasia or dysarthria. Fund of knowledge is appropriate. Attention and concentration are normal.   Cranial nerves: Pupils equal, round. Extraocular movements intact with no nystagmus. Visual fields full.  No facial asymmetry.  Motor: Bulk and tone normal, muscle strength 5/5 throughout with no pronator drift. Sensation intact to all modalities on both  UE. Intact to cold, pin on both LE. Decreased vibration sense to knees bilaterally. Reflexes +1 throughout except for absent ankle jerks bilaterally. Finger to nose testing intact.  Gait narrow-based and steady, difficulty with tandem walk. Romberg negative.   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 He has not noticed much change with addition of low dose nortriptyline, we discussed increasing dose but he opts to increase gabapentin to 1200mg  BID, side effects discussed. He will try lidocaine cream as well. No further seizures since June 2020. We again discussed how alcohol can cause neuropathy, advised to wean off alcohol use. His wife is concerned about his memory, he does not think there is an issue. Discussed Neurocognitive testing, he would like to hold off for now and discuss with his wife. He is aware of [place] driving laws to stop driving after a seizure until 6 months seizure-free. Follow-up in 6-8 months, call for any changes.   Thank you  for allowing me to participate in his care.  Please do not hesitate to call for any questions or concerns.   July 2020, M.D.   CC: Patrcia Dolly, PA-C

## 2020-08-15 NOTE — Patient Instructions (Addendum)
1. Increase gabapentin 300mg : Take 4 caps in AM, 4 caps in PM  2. Continue nortriptyline 10mg  every night  3. Try Aspercreme on your legs at night  4. Start doing stretching exercises once you get home from work  5. Follow-up with PCP regarding bloodwork  6. Follow-up in 6-8 months, call for any changes   Neuropathy exercises: Balance training Peripheral neuropathy can leave your muscles and joints feeling stiff and sometimes weak. Balance training can build your strength and reduce feelings of tightness. Improved balance also prevents falls.  Beginning balance training exercises include leg and calf raises.  Side leg raise  Using a chair or counter, steady your balance with one hand. Stand straight with feet slightly apart. Slowly lift one leg to the side and hold for 5-10 seconds. Lower your leg at the same pace. Repeat with the other leg. As you improve balance, try this exercise without holding onto the counter. Calf raise  Using a chair or counter, steady your balance. Lift the heels of both feet off the ground so you're standing on your toes. Slowly lower yourself down. Repeat for 10-15 reps. Stretching exercises Stretching increases your flexibility and warms up your body for other physical activity. Routine stretching can also reduce your risk of developing an injury while exercising. Common techniques are calf stretches and seated hamstring stretches.  Calf stretch  Place one leg behind you with your toe pointing forward. Take a step forward with the opposite foot and slightly bend the knee. Lean forward with the front leg while keeping the heel on your back leg planted on the floor. Hold this stretch for 15 seconds. Repeat three times per leg. Seated hamstring stretch  Sit on the edge of a chair. Extend one leg in front of you with your toe pointed upward. Bend the opposite knee with your foot flat on the floor. Position your chest over your straight leg, and  straighten your back until you feel a muscle stretch. Hold this position for 15 - 20 seconds. Repeat three times per leg.

## 2020-08-30 ENCOUNTER — Other Ambulatory Visit: Payer: Self-pay

## 2020-08-30 ENCOUNTER — Ambulatory Visit (INDEPENDENT_AMBULATORY_CARE_PROVIDER_SITE_OTHER): Payer: BC Managed Care – PPO | Admitting: Medical

## 2020-08-30 ENCOUNTER — Ambulatory Visit (HOSPITAL_BASED_OUTPATIENT_CLINIC_OR_DEPARTMENT_OTHER)
Admission: RE | Admit: 2020-08-30 | Discharge: 2020-08-30 | Disposition: A | Discharge status: Home / Self Care | Payer: BC Managed Care – PPO | Source: Ambulatory Visit | Attending: Medical | Admitting: Medical

## 2020-08-30 VITALS — BP 135/85 | HR 95 | Resp 18 | Ht 68.0 in | Wt 118.6 lb

## 2020-08-30 DIAGNOSIS — M79672 Pain in left foot: Secondary | ICD-10-CM

## 2020-08-30 DIAGNOSIS — I10 Essential (primary) hypertension: Secondary | ICD-10-CM | POA: Diagnosis not present

## 2020-08-30 LAB — URIC ACID: Uric Acid, Serum: 6.5 mg/dL (ref 4.0–7.8)

## 2020-08-30 NOTE — Patient Instructions (Signed)
Get uric acid and cbc today for persisting foot/toe pain.  Also repeat xray to make sure no fracture as sometimes early on small fractures not seen on initial xray.  Use tylenol for pain.   Work excuse written today. Sports med referral

## 2020-08-30 NOTE — Progress Notes (Signed)
   Subjective:    Patient ID: Samuel Cantrell, male    DOB: 1975-04-30, 45 y.o.   MRN: [id number]  HPI  Pt in for left foot injury. He was playing with grandchild and accidentally kicked tv stand. He has neuropathy and states at first pain was not present. But next day pain was worse. Pt went to UC and was evaluated. He was told no fracture but had cellulitis.   UC note below  "HPI New patient complaining of pain in his left foot about the great toe area x3 days. He was playing with his grandson when he developed his foot against a TV stand. Discomfort at that time was very mild but is gradually worsened since then to moderate/severe degree. Has difficulty with weightbearing at this point. He notices some swelling of the area and redness. No bruising or break in the skin. Patient does have a history of polyneuropathy he does inspect his feet regularly. He denies any history of gout in the past."   Asessment.  1. Cellulitis of toe of left foot cephALEXin (KEFLEX) 500 mg capsule  2. Pain of left great toe Xr Foot Min 3 Views Left  3. Left foot pain Xr Foot Min 3 Views Left  4. Toe injury, left, initial encounter Xr Foot Min 3 Views Left  5. Polyneuropathy Xr Foot Min 3 Views Left"  Xray report. PRIMARY INTERPRETATION:COMPARISON: none FINDINGS: 4 views of the Lt foot demonstrate no acute fracture,  malalignment or other acute osseous abnormality.  Mild soft tissue edema  medial to the 1st MTP joint.  On lateral view there is small, round  density inferior to the MTP joint - appears chronic.  IMPRESSIONS: Negative study for acute fx.  Likely chronic small round  density inferior to the MTP joint. Results viewed in EMR/EPIC:  Primary read performed by Michele Rockers JENNINGS      Pt was given keflex antibiotic. Pt has about 3 more day of antibiotic.   Pt has missed one week of work. He works for [employer]. 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      Review of Systems  Constitutional:   Negative for chills, fatigue and fever.  Respiratory:  Negative for cough, choking, shortness of breath and wheezing.   Cardiovascular:  Negative for chest pain and palpitations.  Musculoskeletal:        Foot pain.     Objective:   Physical Exam  General- No acute distress. Pleasant patient. Neck- Full range of motion, no jvd Lungs- Clear, even and unlabored. Heart- regular rate and rhythm. Neurologic- CNII- XII grossly intact.  Left foot- not swollen. No warmth. Base of left great toe mild tender to palpation. Normal pulses and good capillary refill.     Assessment & Plan:  Get uric acid and cbc today for persisting foot/toe pain.  Also repeat xray to make sure no fracture as sometimes early on small fractures not seen on initial xray.  Use tylenol for pain.   Work excuse written today. Sports med referral placed.   Follow up with sports med but sooner with me if referral delayed.  Time spent with patient today was  30 minutes which consisted of chart review, discussing  differential diagnosis, work up, treatment, possible short term disability  and documentation.

## 2020-08-31 ENCOUNTER — Telehealth: Payer: Self-pay | Admitting: Medical

## 2020-08-31 MED ORDER — DOXYCYCLINE HYCLATE 100 MG PO TABS: 100.0000 mg | ORAL_TABLET | Freq: Two times a day (BID) | ORAL | 0 refills | Status: DC

## 2020-08-31 NOTE — Telephone Encounter (Signed)
Rx doxycycline antibiotic. Stop keflex.

## 2020-09-04 ENCOUNTER — Ambulatory Visit (INDEPENDENT_AMBULATORY_CARE_PROVIDER_SITE_OTHER): Payer: BC Managed Care – PPO | Admitting: Family Medicine

## 2020-09-04 ENCOUNTER — Other Ambulatory Visit: Payer: Self-pay

## 2020-09-04 ENCOUNTER — Encounter: Payer: Self-pay | Admitting: Family Medicine

## 2020-09-04 ENCOUNTER — Ambulatory Visit: Payer: Self-pay

## 2020-09-04 VITALS — BP 112/80 | Ht 68.0 in | Wt 118.0 lb

## 2020-09-04 DIAGNOSIS — M79675 Pain in left toe(s): Secondary | ICD-10-CM

## 2020-09-04 DIAGNOSIS — M7752 Other enthesopathy of left foot: Secondary | ICD-10-CM | POA: Diagnosis not present

## 2020-09-04 MED ORDER — DICLOFENAC SODIUM 2 % EX SOLN: 1.0000 "application " | Freq: Two times a day (BID) | CUTANEOUS | 2 refills | Status: DC

## 2020-09-04 NOTE — Assessment & Plan Note (Signed)
He struck his toe and has had pain since that time.  Imaging negative for fracture.   -Counseled on home exercise therapy and supportive care. -Green sport insoles with first ray post. -Pennsaid. -Provided work note. -Could consider physical therapy.

## 2020-09-04 NOTE — Patient Instructions (Signed)
Good to see you Please try the insole  Please try the rub on medicine  Please try range of motion movements.   Please send me a message in MyChart with any questions or updates.  Please see me back in 4 weeks.   --Dr. Jordan Likes

## 2020-09-04 NOTE — Progress Notes (Signed)
  Samuel Cantrell - 45 y.o. male MRN [id number]  Date of birth: 1975-11-12  SUBJECTIVE:  Including CC & ROS.  No chief complaint on file.   Samuel Cantrell is a 45 y.o. male that is presenting with left great toe pain.  The pain is been ongoing since he struck his toe against the cabinet.  Pain is worse with prolonged ambulation and he loses his balance from time to time..  Independent review of the left foot x-ray from 6/23 shows no acute changes.  Independent review of the uric acid from 6/23 shows a level of 6.6.   Review of Systems See HPI   HISTORY: Past Medical, Surgical, Social, and Family History Reviewed & Updated per EMR.   Pertinent Historical Findings include:  Past Medical History:  Diagnosis Date   Allergy    GERD (gastroesophageal reflux disease)    Hypertensive urgency 02/28/2016    Past Surgical History:  Procedure Laterality Date   NO PAST SURGERIES      Family History  Problem Relation Age of Onset   Hypertension Mother     Social History   Socioeconomic History   Marital status: Married    Spouse name: Not on file   Number of children: Not on file   Years of education: Not on file   Highest education level: Not on file  Occupational History   Occupation: Astronomer  Tobacco Use   Smoking status: Every Day    Packs/day: 0.50    Years: 8.00    Pack years: 4.00    Types: Cigarettes   Smokeless tobacco: Never  Vaping Use   Vaping Use: Never used  Substance and Sexual Activity   Alcohol use: Yes    Comment: occ   Drug use: No   Sexual activity: Not on file  Other Topics Concern   Not on file  Social History Narrative   Pt lives with wife.   Right handed   Highest level of edu- 3 years of colleg   Social Determinants of Health   Financial Resource Strain: Not on file  Food Insecurity: Not on file  Transportation Needs: Not on file  Physical Activity: Not on file  Stress: Not on file  Social Connections: Not on file   Intimate Partner Violence: Not on file     PHYSICAL EXAM:  VS: BP 112/80 (BP Location: Left Arm, Patient Position: Sitting, Cuff Size: Normal)   Ht 5\' 8"  (1.727 m)   Wt 118 lb (53.5 kg)   BMI 17.94 kg/m  Physical Exam Gen: NAD, alert, cooperative with exam, well-appearing MSK:  Left foot: Normal range of motion of the great toe. No significant redness at the MTP joint. Able to bear weight with minimal pain. Neurovascular intact  Limited ultrasound: Left great toe:  No changes observed at the MTP joint. No changes of the proximal phalanx. No increased hyperemia.  Summary: No structural changes appreciated  Ultrasound and interpretation by , MD    ASSESSMENT & PLAN:   Capsulitis of toe of left foot He struck his toe and has had pain since that time.  Imaging negative for fracture.   -Counseled on home exercise therapy and supportive care. -Green sport insoles with first ray post. -Pennsaid. -Provided work note. -Could consider physical therapy.

## 2020-09-06 ENCOUNTER — Ambulatory Visit: Payer: BC Managed Care – PPO | Admitting: Family Medicine

## 2020-09-06 ENCOUNTER — Other Ambulatory Visit: Payer: Self-pay | Admitting: *Deleted

## 2020-09-06 MED ORDER — DICLOFENAC SODIUM 2 % EX SOLN: 1.0000 "application " | Freq: Two times a day (BID) | CUTANEOUS | 2 refills | Status: DC

## 2020-09-12 ENCOUNTER — Encounter: Payer: Self-pay | Admitting: *Deleted

## 2020-09-12 ENCOUNTER — Telehealth: Payer: Self-pay | Admitting: Family Medicine

## 2020-09-12 NOTE — Telephone Encounter (Signed)
Patient called states Employer needs RTW letter ,that said cleared w/No restrictions to return to work 09/19/20.  Forwarding request to med asst for review w/provider.  --pls call pt for pick up when letter completed.  P) [phone]  --glh

## 2020-10-02 ENCOUNTER — Ambulatory Visit: Payer: BC Managed Care – PPO | Admitting: Family Medicine

## 2020-10-02 NOTE — Progress Notes (Deleted)
  Samuel Cantrell - 45 y.o. male MRN [id number]  Date of birth: [birth year]  SUBJECTIVE:  Including CC & ROS.  No chief complaint on file.   Samuel Cantrell is a 45 y.o. male that is  ***.  ***   Review of Systems See HPI   HISTORY: Past Medical, Surgical, Social, and Family History Reviewed & Updated per EMR.   Pertinent Historical Findings include:  Past Medical History:  Diagnosis Date  . Allergy   . GERD (gastroesophageal reflux disease)   . Hypertensive urgency 02/28/2016    Past Surgical History:  Procedure Laterality Date  . NO PAST SURGERIES      Family History  Problem Relation Age of Onset  . Hypertension Mother     Social History   Socioeconomic History  . Marital status: Married    Spouse name: Not on file  . Number of children: Not on file  . Years of education: Not on file  . Highest education level: Not on file  Occupational History  . Occupation: Astronomer  Tobacco Use  . Smoking status: Every Day    Packs/day: 0.50    Years: 8.00    Pack years: 4.00    Types: Cigarettes  . Smokeless tobacco: Never  Vaping Use  . Vaping Use: Never used  Substance and Sexual Activity  . Alcohol use: Yes    Comment: occ  . Drug use: No  . Sexual activity: Not on file  Other Topics Concern  . Not on file  Social History Narrative   Pt lives with wife.   Right handed   Highest level of edu- 3 years of colleg   Social Determinants of Health   Financial Resource Strain: Not on file  Food Insecurity: Not on file  Transportation Needs: Not on file  Physical Activity: Not on file  Stress: Not on file  Social Connections: Not on file  Intimate Partner Violence: Not on file     PHYSICAL EXAM:  VS: There were no vitals taken for this visit. Physical Exam Gen: NAD, alert, cooperative with exam, well-appearing MSK:  ***      ASSESSMENT & PLAN:   No problem-specific Assessment & Plan notes found for this encounter.

## 2020-11-11 ENCOUNTER — Other Ambulatory Visit: Payer: Self-pay | Admitting: Hematology & Oncology

## 2020-11-13 ENCOUNTER — Encounter: Payer: Self-pay | Admitting: Medical

## 2020-11-13 ENCOUNTER — Other Ambulatory Visit: Payer: Self-pay

## 2020-11-13 ENCOUNTER — Ambulatory Visit (INDEPENDENT_AMBULATORY_CARE_PROVIDER_SITE_OTHER): Payer: BC Managed Care – PPO | Admitting: Medical

## 2020-11-13 VITALS — BP 140/80 | HR 87 | Resp 18 | Ht 68.0 in | Wt 121.8 lb

## 2020-11-13 DIAGNOSIS — L089 Local infection of the skin and subcutaneous tissue, unspecified: Secondary | ICD-10-CM

## 2020-11-13 DIAGNOSIS — R221 Localized swelling, mass and lump, neck: Secondary | ICD-10-CM

## 2020-11-13 MED ORDER — DOXYCYCLINE HYCLATE 100 MG PO TABS: 100.0000 mg | ORAL_TABLET | Freq: Two times a day (BID) | ORAL | 0 refills | Status: DC

## 2020-11-13 NOTE — Progress Notes (Signed)
Subjective:    Patient ID: Samuel Cantrell, male    DOB: [birth year], 45 y.o.   MRN: [id number]  HPI  Pt has small raised tender area below ear. Came up over past 2 weeks. At first small bump on skin. Then became raised like a pea. Now about twice size of a pea. No fever, no chills or sweats. Pt states slight pain on palpation or squeezing.   Review of Systems  Constitutional:  Positive for chills. Negative for fatigue and fever.  Respiratory:  Negative for cough, chest tightness, shortness of breath and wheezing.   Cardiovascular:  Negative for chest pain and palpitations.  Gastrointestinal:  Negative for abdominal distention and abdominal pain.  Genitourinary:  Negative for difficulty urinating, enuresis, flank pain, frequency and urgency.  Musculoskeletal:  Positive for neck pain. Negative for back pain.    Past Medical History:  Diagnosis Date   Allergy    GERD (gastroesophageal reflux disease)    Hypertensive urgency 02/28/2016     Social History   Socioeconomic History   Marital status: Married    Spouse name: Not on file   Number of children: Not on file   Years of education: Not on file   Highest education level: Not on file  Occupational History   Occupation: Astronomer  Tobacco Use   Smoking status: Every Day    Packs/day: 0.50    Years: 8.00    Pack years: 4.00    Types: Cigarettes   Smokeless tobacco: Never  Vaping Use   Vaping Use: Never used  Substance and Sexual Activity   Alcohol use: Yes    Comment: occ   Drug use: No   Sexual activity: Not on file  Other Topics Concern   Not on file  Social History Narrative   Pt lives with wife.   Right handed   Highest level of edu- 3 years of colleg   Social Determinants of Health   Financial Resource Strain: Not on file  Food Insecurity: Not on file  Transportation Needs: Not on file  Physical Activity: Not on file  Stress: Not on file  Social Connections: Not on file  Intimate Partner  Violence: Not on file    Past Surgical History:  Procedure Laterality Date   NO PAST SURGERIES      Family History  Problem Relation Age of Onset   Hypertension Mother     No Known Allergies  Current Outpatient Medications on File Prior to Visit  Medication Sig Dispense Refill   carvedilol (COREG) 12.5 MG tablet TAKE 1 TABLET(12.5 MG) BY MOUTH TWICE DAILY WITH A MEAL 180 tablet 3   Diclofenac Sodium (PENNSAID) 2 % SOLN Place 1 application onto the skin 2 (two) times daily. 112 g 2   diphenhydrAMINE (BENADRYL) 25 MG tablet Take 25 mg by mouth every 6 (six) hours as needed for itching or allergies.      folic acid (FOLVITE) 1 MG tablet TAKE 1 TABLET(1 MG) BY MOUTH DAILY 90 tablet 3   gabapentin (NEURONTIN) 300 MG capsule Take 4 caps in AM, 4 caps in PM 240 capsule 11   nortriptyline (PAMELOR) 10 MG capsule Take 1 capsule (10 mg total) by mouth at bedtime. 30 capsule 11   potassium chloride SA (KLOR-CON) 20 MEQ tablet 1 tab po every other day 15 tablet 0   thiamine 100 MG tablet Take 1 tablet (100 mg total) by mouth daily. 90 tablet 3   XARELTO 10 MG TABS tablet  TAKE 1 TABLET(10 MG) BY MOUTH DAILY 30 tablet 6   No current facility-administered medications on file prior to visit.    BP (!) 144/84 (BP Location: Right Arm, Patient Position: Sitting, Cuff Size: Normal)   Pulse 87   Resp 18   Ht 5\' 8"  (1.727 m)   Wt 121 lb 12.8 oz (55.2 kg)   SpO2 96%   BMI 18.52 kg/m       Objective:   Physical Exam  General- No acute distress. Pleasant patient. Neck- Full range of motion, no jvd. No neck stiffness. Lungs- Clear, even and unlabored. Heart- regular rate and rhythm. Neurologic- CNII- XII grossly intact.   Skin- below left ear. Behind angle of mandible and in front of sternocleidomastoid is location.      Assessment & Plan:   Patient Instructions  Probable skin infection. Considering early infected sebaceous cyst vs early abscess. However no fluctuance presently.    Recommend warm compresses twice daily. Rx doxycycline twice daily. Rx advisement.  Follow up in 7 days or sooner if needed.  Cbc today.   , PA-C

## 2020-11-13 NOTE — Patient Instructions (Signed)
Probable skin infection. Considering early infected sebaceous cyst vs early abscess. However no fluctuance presently.   Recommend warm compresses twice daily. Rx doxycycline twice daily. Rx advisement.  Follow up in 7 days or sooner if needed.  Cbc today.

## 2020-12-16 ENCOUNTER — Other Ambulatory Visit: Payer: Self-pay | Admitting: Medical

## 2021-02-13 ENCOUNTER — Other Ambulatory Visit: Payer: Self-pay | Admitting: Medical

## 2021-03-17 ENCOUNTER — Other Ambulatory Visit: Payer: Self-pay | Admitting: Neurology

## 2021-03-28 ENCOUNTER — Ambulatory Visit: Payer: BC Managed Care – PPO | Admitting: Neurology

## 2021-03-29 ENCOUNTER — Ambulatory Visit: Payer: BC Managed Care – PPO | Admitting: Neurology

## 2021-04-12 ENCOUNTER — Encounter: Payer: Self-pay | Admitting: Neurology

## 2021-04-12 ENCOUNTER — Other Ambulatory Visit: Payer: Self-pay

## 2021-04-12 ENCOUNTER — Ambulatory Visit (INDEPENDENT_AMBULATORY_CARE_PROVIDER_SITE_OTHER): Payer: BC Managed Care – PPO | Admitting: Neurology

## 2021-04-12 VITALS — BP 146/82 | HR 95 | Ht 68.0 in | Wt 131.2 lb

## 2021-04-12 DIAGNOSIS — G629 Polyneuropathy, unspecified: Secondary | ICD-10-CM | POA: Diagnosis not present

## 2021-04-12 MED ORDER — NORTRIPTYLINE HCL 10 MG PO CAPS: 10.0000 mg | ORAL_CAPSULE | Freq: Every day | ORAL | 3 refills | Status: DC

## 2021-04-12 MED ORDER — GABAPENTIN 600 MG PO TABS: ORAL_TABLET | ORAL | 3 refills | Status: DC

## 2021-04-12 NOTE — Patient Instructions (Signed)
We will switch the gabapentin so you are not taking so many pills: take Gabapentin 600mg : 2 capsules twice a day  2. Take the nortriptyline 10mg  every night  3. Continue with slowly reducing and cutting down on alcohol use  4. Follow-up in 8 months, call for any changes

## 2021-04-12 NOTE — Progress Notes (Signed)
NEUROLOGY FOLLOW UP OFFICE NOTE  Samuel Cantrell JL:[id number] October 28, 1975  HISTORY OF PRESENT ILLNESS: I had the pleasure of seeing Samuel Cantrell in follow-up in the neurology clinic on 04/12/2021.  The patient was last seen 8 months ago for new onset seizure in 08/2018 (likely alcohol withdrawal) and neuropathy. He has been seizure-free since 2020, no staring/unresponsive episodes, gaps in time, olfactory/gustatory hallucinations, focal numbness/tingling/weakness, myoclonic jerks. On his last visit, gabapentin dose was increased to 1200mg  BID for neuropathy. He still feels the numbness, with sharp electricity at times. He only takes the nortriptyline 10mg  as needed, which does help. No side effects on medications. He has noticed occasional tingling in both hands. He denies any headaches, dizziness, no falls. Memory concerns were previously raised, he denies any concerns today. He reports cutting down on alcohol intake to 2-3 shots every night.  History on Initial Assessment 11/11/2018: This is a 46 year old right-handed man with a history of hypertension, recurrent DVT, protein C deficiency, alcohol abuse, presenting for evaluation of neuropathy. He also recently had a new onset seizure felt secondary to alcohol withdrawal. He reports symptoms started around 2 months ago with back pain, records indicate this occurred in May 2020. He started having back pain going down his legs with numbness and tingling. The back pain has resolved however the paresthesias continue below his knees down to his toes. Every once in a while there is a pain like sticking with a pin. Hands are not affected. He loses his balance a little. He has urinary frequency, no bowel/bladder incontinence. He denies any falls. No family history of neuropathy. He was seen by Sports Medicine and had tried low dose gabapentin 100mg  qhs with no effect.   He was admitted to [facility] 09/04/2018 for seizures felt secondary  to alcohol withdrawal. His wife reports he came out of the bathroom and sat on the couch, then she heard a loud yell and found him with body tensed up, he had fallen off the couch. She reports he stopped breathing for a few minutes and she performed CPR, during which time his eyes opened and he could answer questions but did not remember what happened. He did not realize he had another GTC in the ER. CT head no acute changes. Per report he normally drinks 2-3 shots per day (today he reports 5-6 shots a day) then stopped abruptly few days prior. He had severe hypokalemia and hypomagnesemia, low B12, B1, and folate levels. He has been on replacement therapy since then. His wife reports he was very disoriented, he was zoning out when he first got home, this has not occurred since. She denies any staring/unresponsive episodes, he denies any other gaps in time, olfactory/gustatory hallucinations, deja vu, rising epigastric sensation, focal weakness. He has occasional jerking of his legs. He states he has cut down on alcohol, he is not drinking vodka any longer but still drinks 3 beers daily. He feels dizzy if he stands for a prolonged period, his BP drops to 90/50.  He had a normal birth and early development.  There is no history of febrile convulsions, CNS infections such as meningitis/encephalitis, significant traumatic brain injury, neurosurgical procedures, or family history of seizures.   PAST MEDICAL HISTORY: Past Medical History:  Diagnosis Date   Allergy    GERD (gastroesophageal reflux disease)    Hypertensive urgency 02/28/2016    MEDICATIONS: Current Outpatient Medications on File Prior to Visit  Medication Sig Dispense Refill  carvedilol (COREG) 12.5 MG tablet TAKE 1 TABLET(12.5 MG) BY MOUTH TWICE DAILY WITH A MEAL 180 tablet 3   Diclofenac Sodium (PENNSAID) 2 % SOLN Place 1 application onto the skin 2 (two) times daily. 112 g 2   diphenhydrAMINE (BENADRYL) 25 MG tablet Take 25 mg by mouth  every 6 (six) hours as needed for itching or allergies.      doxycycline (VIBRA-TABS) 100 MG tablet Take 1 tablet (100 mg total) by mouth 2 (two) times daily. 20 tablet 0   folic acid (FOLVITE) 1 MG tablet TAKE 1 TABLET(1 MG) BY MOUTH DAILY 90 tablet 3   gabapentin (NEURONTIN) 300 MG capsule Take 4 caps in AM, 4 caps in PM 240 capsule 11   nortriptyline (PAMELOR) 10 MG capsule Take 1 capsule (10 mg total) by mouth at bedtime. 30 capsule 11   potassium chloride SA (KLOR-CON) 20 MEQ tablet 1 tab po every other day 15 tablet 0   thiamine 100 MG tablet Take 1 tablet (100 mg total) by mouth daily. 90 tablet 3   XARELTO 10 MG TABS tablet TAKE 1 TABLET(10 MG) BY MOUTH DAILY 30 tablet 6   No current facility-administered medications on file prior to visit.    ALLERGIES: No Known Allergies  FAMILY HISTORY: Family History  Problem Relation Age of Onset   Hypertension Mother     SOCIAL HISTORY: Social History   Socioeconomic History   Marital status: Married    Spouse name: Not on file   Number of children: Not on file   Years of education: Not on file   Highest education level: Not on file  Occupational History   Occupation: Musician  Tobacco Use   Smoking status: Every Day    Packs/day: 0.50    Years: 8.00    Pack years: 4.00    Types: Cigarettes   Smokeless tobacco: Never  Vaping Use   Vaping Use: Never used  Substance and Sexual Activity   Alcohol use: Yes    Comment: occ   Drug use: No   Sexual activity: Not on file  Other Topics Concern   Not on file  Social History Narrative   Pt lives with wife.   Right handed   Highest level of edu- 3 years of colleg   Social Determinants of Health   Financial Resource Strain: Not on file  Food Insecurity: Not on file  Transportation Needs: Not on file  Physical Activity: Not on file  Stress: Not on file  Social Connections: Not on file  Intimate Partner Violence: Not on file     PHYSICAL EXAM: Vitals:    04/12/21 1202  BP: (!) 146/82  Pulse: 95  SpO2: 98%   General: No acute distress Head:  Normocephalic/atraumatic Skin/Extremities: No rash, no edema Neurological Exam: alert and awake. No aphasia or dysarthria. Fund of knowledge is appropriate.   Attention and concentration are normal.   Cranial nerves: Pupils equal, round. Extraocular movements intact with no nystagmus. Visual fields full.  No facial asymmetry.  Motor: Bulk and tone normal, muscle strength 5/5 throughout with no pronator drift.  Sensation intact to all modalities on both UE, intact to cold, pin on both LE, decreased vibration sense to ankles bilaterally. Reflexes +1 both UE, unable to elicit on both LE. Finger to nose testing intact.  Gait narrow-based and steady, difficulty with tandem walk. Romberg positive.    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 Continue gabapentin 1200mg  BID, he was advised to take the nortriptyline 10mg  qhs on a regular basis. Continue with cutting down on alcohol. He has not had any further seizures since June 2020. No memory concerns today. He is aware of [place] driving laws to stop driving after a seizure until 6 months seizure-free. Follow-up in 6 months, call for any changes.     Thank you for allowing me to participate in his care.  Please do not hesitate to call for any questions or concerns.   Ellouise Newer, M.D.   CC: Mackie Pai, PA-C

## 2021-06-21 ENCOUNTER — Other Ambulatory Visit: Payer: Self-pay | Admitting: Hematology & Oncology

## 2021-09-20 ENCOUNTER — Telehealth: Payer: Self-pay | Admitting: Neurology

## 2021-09-20 DIAGNOSIS — Z79899 Other long term (current) drug therapy: Secondary | ICD-10-CM

## 2021-09-20 MED ORDER — GABAPENTIN 600 MG PO TABS: ORAL_TABLET | ORAL | 3 refills | Status: DC

## 2021-09-20 NOTE — Telephone Encounter (Signed)
Patient called and left a message to call back about this matter.

## 2021-09-20 NOTE — Telephone Encounter (Signed)
Patient called and stated the pharmacy said its too soon to get his gabapentin.  He is out of the medicine.  He said it looks like the prescription is written for the wrong amount so it looks like its too soon.  He uses [facility] in [place].

## 2021-09-20 NOTE — Telephone Encounter (Signed)
Pls let him know that 2400mg  total daily dose is usually the highest we recommend (which is 2 tabs twice a day of gabapentin 600mg ). Since he has increased it, we will need to make sure his kidney function is fine because higher doses than 2400mg  may be an issue. Pls check CMP. Will send Gabapentin 600mg : 2 caps in AM, 3 caps in PM for now, but if kidney function is not good, we will have to go down on dose. Thanks

## 2021-09-20 NOTE — Telephone Encounter (Signed)
Pt called an informed that 2400mg  total daily dose is usually the highest we recommend (which is 2 tabs twice a day of gabapentin 600mg ). Since he has increased it, we will need to make sure his kidney function is fine because higher doses than 2400mg  may be an issue. Pls check CMP. Will send Gabapentin 600mg : 2 caps in AM, 3 caps in PM for now, but if kidney function is not good, we will have to go down on dose

## 2021-09-20 NOTE — Addendum Note (Signed)
Addended by: Dimas Chyle on: 09/20/2021 01:54 PM   Modules accepted: Orders

## 2021-09-20 NOTE — Telephone Encounter (Signed)
Pt stated he is taken 2 tabes in the morning and 3 at night needs a new script sent in if you are ok with him taken that much , he has been taken that [place] since feb he said you told him to increase at his appointment I did not see that in your notes

## 2021-09-20 NOTE — Telephone Encounter (Signed)
Patient called the office back, left message with access nurse.

## 2021-09-25 ENCOUNTER — Other Ambulatory Visit (INDEPENDENT_AMBULATORY_CARE_PROVIDER_SITE_OTHER): Payer: BC Managed Care – PPO

## 2021-09-25 DIAGNOSIS — Z79899 Other long term (current) drug therapy: Secondary | ICD-10-CM | POA: Diagnosis not present

## 2021-09-25 LAB — COMPREHENSIVE METABOLIC PANEL
ALT: 70 U/L — ABNORMAL HIGH (ref 0–53)
AST: 148 U/L — ABNORMAL HIGH (ref 0–37)
Albumin: 4.3 g/dL (ref 3.5–5.2)
Alkaline Phosphatase: 128 U/L — ABNORMAL HIGH (ref 39–117)
BUN: 7 mg/dL (ref 6–23)
CO2: 33 mEq/L — ABNORMAL HIGH (ref 19–32)
Calcium: 8.9 mg/dL (ref 8.4–10.5)
Chloride: 98 mEq/L (ref 96–112)
Creatinine, Ser: 0.58 mg/dL (ref 0.40–1.50)
GFR: 115.4 mL/min (ref >60.00)
Glucose, Bld: 105 mg/dL — ABNORMAL HIGH (ref 70–99)
Potassium: 3.2 mEq/L — ABNORMAL LOW (ref 3.5–5.1)
Sodium: 141 mEq/L (ref 135–145)
Total Bilirubin: 0.8 mg/dL (ref 0.2–1.2)
Total Protein: 8.5 g/dL — ABNORMAL HIGH (ref 6.0–8.3)

## 2021-09-25 MED ORDER — POTASSIUM CHLORIDE CRYS ER 10 MEQ PO TBCR: 10.0000 meq | EXTENDED_RELEASE_TABLET | Freq: Every day | ORAL | 0 refills | Status: DC

## 2021-09-25 NOTE — Addendum Note (Signed)
Addended by: Gwenevere Abbot on: 09/25/2021 04:15 PM   Modules accepted: Orders

## 2021-12-29 ENCOUNTER — Other Ambulatory Visit: Payer: Self-pay | Admitting: Neurology

## 2021-12-29 ENCOUNTER — Other Ambulatory Visit: Payer: Self-pay | Admitting: Hematology & Oncology

## 2021-12-31 ENCOUNTER — Ambulatory Visit (INDEPENDENT_AMBULATORY_CARE_PROVIDER_SITE_OTHER): Payer: BC Managed Care – PPO | Admitting: Neurology

## 2021-12-31 ENCOUNTER — Encounter: Payer: Self-pay | Admitting: Neurology

## 2021-12-31 VITALS — BP 133/83 | HR 85 | Ht 68.0 in | Wt 127.0 lb

## 2021-12-31 DIAGNOSIS — G629 Polyneuropathy, unspecified: Secondary | ICD-10-CM | POA: Diagnosis not present

## 2021-12-31 DIAGNOSIS — R569 Unspecified convulsions: Secondary | ICD-10-CM

## 2021-12-31 MED ORDER — GABAPENTIN 600 MG PO TABS: ORAL_TABLET | ORAL | 3 refills | Status: DC

## 2021-12-31 MED ORDER — NORTRIPTYLINE HCL 10 MG PO CAPS: 10.0000 mg | ORAL_CAPSULE | Freq: Every day | ORAL | 3 refills | Status: DC

## 2021-12-31 MED ORDER — THIAMINE HCL 100 MG PO TABS: 100.0000 mg | ORAL_TABLET | Freq: Every day | ORAL | 3 refills | Status: DC

## 2021-12-31 MED ORDER — FOLIC ACID 1 MG PO TABS: ORAL_TABLET | ORAL | 3 refills | Status: DC

## 2021-12-31 NOTE — Patient Instructions (Signed)
Good to see you.  Continue Gabapentin 600mg : take 2 caps in AM, 3 caps in PM  2. Continue nortriptyline 10mg  every night  3. Continue daily folic acid and thiamine  4. Continue working on reduction and eventual stopping of alcohol as this can continue worsening nerve damage  5. Follow-up in 8 months, call for any changes

## 2021-12-31 NOTE — Progress Notes (Signed)
NEUROLOGY FOLLOW UP OFFICE NOTE  Samuel Cantrell [id number] 10-Jan-1976  HISTORY OF PRESENT ILLNESS: I had the pleasure of seeing Samuel Cantrell in follow-up in the neurology clinic on 12/31/2021.  The patient was last seen 8 months ago. He had a seizure in June 2020 (likely alcohol withdrawal) and neuropathy. Records and images were personally reviewed where available.  Since his last visit, he contacted our office in July that he ran out of gabapentin because he had been taking 2 caps in AM, 3 caps in PM. CMP was normal. He denies any side effects, he is also on nortriptyline 10mg  qhs. When he ran out of medication, his legs were in a lot of pain, gabapentin does help. He reports neuropathy is unchanged, feet are always numb. Occasionally he may have an electric shock pain, but it is not often. There is no burning. There is some tingling with the numbness. No weakness or falls. Sometimes his hands have tingling and numbness. He denies any neck or back pain. Sometimes when working outside, both legs get heavy. No bowel/bladder dysfunction. He denies any headaches, dizziness, no further seizures or seizure-like symptoms since June 2020, no staring/unresponsive episodes. He gets 6-7 hours of sleep. He reports he has "slacked off a whole lot" with alcohol, he is not drinking beer and has maybe 2 wine coolers a day.     Chemistry      Component Value Date/Time   NA 141 09/25/2021 1003   NA 141 07/29/2018 1025   NA 145 01/30/2017 0952   K 3.2 (L) 09/25/2021 1003   K 4.1 01/30/2017 0952   CL 98 09/25/2021 1003   CL 105 01/30/2017 0952   CO2 33 (H) 09/25/2021 1003   CO2 28 01/30/2017 0952   BUN 7 09/25/2021 1003   BUN 8 07/29/2018 1025   BUN 11 01/30/2017 0952   CREATININE 0.58 09/25/2021 1003   CREATININE 0.66 01/03/2020 0751      Component Value Date/Time   CALCIUM 8.9 09/25/2021 1003   CALCIUM 8.7 01/30/2017 0952   ALKPHOS 128 (H) 09/25/2021 1003   ALKPHOS 162 (H) 01/30/2017 0952    AST 148 (H) 09/25/2021 1003   AST 88 (H) 12/23/2018 1100   ALT 70 (H) 09/25/2021 1003   ALT 47 (H) 12/23/2018 1100   ALT 92 (H) 01/30/2017 0952   BILITOT 0.8 09/25/2021 1003   BILITOT 0.8 12/23/2018 1100       History on Initial Assessment 11/11/2018: This is a 46 year old right-handed man with a history of hypertension, recurrent DVT, protein C deficiency, alcohol abuse, presenting for evaluation of neuropathy. He also recently had a new onset seizure felt secondary to alcohol withdrawal. He reports symptoms started around 2 months ago with back pain, records indicate this occurred in May 2020. He started having back pain going down his legs with numbness and tingling. The back pain has resolved however the paresthesias continue below his knees down to his toes. Every once in a while there is a pain like sticking with a pin. Hands are not affected. He loses his balance a little. He has urinary frequency, no bowel/bladder incontinence. He denies any falls. No family history of neuropathy. He was seen by Sports Medicine and had tried low dose gabapentin 100mg  qhs with no effect.   He was admitted to [facility] in [facility] last 09/04/2018 for seizures felt secondary to alcohol withdrawal. His wife reports he came out of the bathroom and sat on the  couch, then she heard a loud yell and found him with body tensed up, he had fallen off the couch. She reports he stopped breathing for a few minutes and she performed CPR, during which time his eyes opened and he could answer questions but did not remember what happened. He did not realize he had another GTC in the ER. CT head no acute changes. Per report he normally drinks 2-3 shots per day (today he reports 5-6 shots a day) then stopped abruptly few days prior. He had severe hypokalemia and hypomagnesemia, low B12, B1, and folate levels. He has been on replacement therapy since then. His wife reports he was very disoriented, he was zoning out when he first  got home, this has not occurred since. She denies any staring/unresponsive episodes, he denies any other gaps in time, olfactory/gustatory hallucinations, deja vu, rising epigastric sensation, focal weakness. He has occasional jerking of his legs. He states he has cut down on alcohol, he is not drinking vodka any longer but still drinks 3 beers daily. He feels dizzy if he stands for a prolonged period, his BP drops to 90/50.  He had a normal birth and early development.  There is no history of febrile convulsions, CNS infections such as meningitis/encephalitis, significant traumatic brain injury, neurosurgical procedures, or family history of seizures.    PAST MEDICAL HISTORY: Past Medical History:  Diagnosis Date   Allergy    GERD (gastroesophageal reflux disease)    Hypertensive urgency 02/28/2016    MEDICATIONS: Current Outpatient Medications on File Prior to Visit  Medication Sig Dispense Refill   carvedilol (COREG) 12.5 MG tablet TAKE 1 TABLET(12.5 MG) BY MOUTH TWICE DAILY WITH A MEAL 180 tablet 3   diphenhydrAMINE (BENADRYL) 25 MG tablet Take 25 mg by mouth every 6 (six) hours as needed for itching or allergies.      doxycycline (VIBRA-TABS) 100 MG tablet Take 1 tablet (100 mg total) by mouth 2 (two) times daily. 20 tablet 0   folic acid (FOLVITE) 1 MG tablet TAKE 1 TABLET(1 MG) BY MOUTH DAILY 90 tablet 3   gabapentin (NEURONTIN) 600 MG tablet Take 2 caps in AM, 3 caps in PM 150 tablet 3   nortriptyline (PAMELOR) 10 MG capsule Take 1 capsule (10 mg total) by mouth at bedtime. 90 capsule 3   potassium chloride (KLOR-CON M) 10 MEQ tablet Take 1 tablet (10 mEq total) by mouth daily. 14 tablet 0   thiamine 100 MG tablet Take 1 tablet (100 mg total) by mouth daily. 90 tablet 3   XARELTO 10 MG TABS tablet TAKE 1 TABLET(10 MG) BY MOUTH DAILY 30 tablet 6   No current facility-administered medications on file prior to visit.    ALLERGIES: No Known Allergies  FAMILY HISTORY: Family  History  Problem Relation Age of Onset   Hypertension Mother     SOCIAL HISTORY: Social History   Socioeconomic History   Marital status: Married    Spouse name: Not on file   Number of children: Not on file   Years of education: Not on file   Highest education level: Not on file  Occupational History   Occupation: Utility Locator  Tobacco Use   Smoking status: Every Day    Packs/day: 0.50    Years: 8.00    Total pack years: 4.00    Types: Cigarettes   Smokeless tobacco: Never  Vaping Use   Vaping Use: Never used  Substance and Sexual Activity   Alcohol use: Yes  Comment: occ   Drug use: No   Sexual activity: Not on file  Other Topics Concern   Not on file  Social History Narrative   Pt lives with wife.   Right handed   Highest level of edu- 3 years of colleg   Social Determinants of Health   Financial Resource Strain: Not on file  Food Insecurity: Not on file  Transportation Needs: Not on file  Physical Activity: Not on file  Stress: Not on file  Social Connections: Not on file  Intimate Partner Violence: Not on file     PHYSICAL EXAM: Vitals:   12/31/21 0901  BP: 133/83  Pulse: 85  SpO2: 99%   General: No acute distress Head:  Normocephalic/atraumatic Skin/Extremities: No rash, no edema Neurological Exam: alert and awake. No aphasia or dysarthria. Fund of knowledge is appropriate.   Attention and concentration are normal.   Cranial nerves: Pupils equal, round. Extraocular movements intact with no nystagmus. Visual fields full.  No facial asymmetry.  Motor: Bulk and tone normal, muscle strength 5/5 throughout with no pronator drift. Sensation intact to cold, vibration sense. Reflexes unable to elicit throughout.  Finger to nose testing intact.  Gait narrow-based and steady, able to tandem walk adequately.  Romberg negative.   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 He reports neuropathy is overall stable on high dose Gabapentin 600mg  2 caps in AM, 3 caps in PM and nortriptyline 10mg  qhs. We again discussed continued alcohol reduction and eventual cessation. Continue folic acid and thiamine. No further seizures since June 2020. He is aware of [place] driving laws to stop driving after a seizure until 6 months seizure-free. Follow-up in 8 months, call for any changes.    Thank you for allowing me to participate in his care.  Please do not hesitate to call for any questions or concerns.    , M.D.   CC: July 2020, PA-C

## 2022-03-05 ENCOUNTER — Other Ambulatory Visit: Payer: Self-pay | Admitting: Family Medicine

## 2022-04-08 ENCOUNTER — Other Ambulatory Visit: Payer: Self-pay | Admitting: Neurology

## 2022-06-01 ENCOUNTER — Other Ambulatory Visit: Payer: Self-pay | Admitting: Medical

## 2022-06-23 ENCOUNTER — Encounter: Payer: Self-pay | Admitting: *Deleted

## 2022-08-03 ENCOUNTER — Other Ambulatory Visit: Payer: Self-pay | Admitting: Hematology & Oncology

## 2022-08-24 ENCOUNTER — Other Ambulatory Visit: Payer: Self-pay | Admitting: Medical

## 2022-09-01 ENCOUNTER — Other Ambulatory Visit: Payer: BC Managed Care – PPO

## 2022-09-02 ENCOUNTER — Ambulatory Visit (INDEPENDENT_AMBULATORY_CARE_PROVIDER_SITE_OTHER): Payer: BC Managed Care – PPO | Admitting: Neurology

## 2022-09-02 ENCOUNTER — Encounter: Payer: Self-pay | Admitting: Neurology

## 2022-09-02 ENCOUNTER — Other Ambulatory Visit (INDEPENDENT_AMBULATORY_CARE_PROVIDER_SITE_OTHER): Payer: BC Managed Care – PPO

## 2022-09-02 VITALS — BP 151/94 | HR 89 | Ht 68.0 in | Wt 121.4 lb

## 2022-09-02 DIAGNOSIS — G629 Polyneuropathy, unspecified: Secondary | ICD-10-CM

## 2022-09-02 DIAGNOSIS — R569 Unspecified convulsions: Secondary | ICD-10-CM

## 2022-09-02 LAB — COMPREHENSIVE METABOLIC PANEL
ALT: 45 U/L (ref 0–53)
AST: 141 U/L — ABNORMAL HIGH (ref 0–37)
Albumin: 4.1 g/dL (ref 3.5–5.2)
Alkaline Phosphatase: 147 U/L — ABNORMAL HIGH (ref 39–117)
BUN: 10 mg/dL (ref 6–23)
CO2: 31 mEq/L (ref 19–32)
Calcium: 9.3 mg/dL (ref 8.4–10.5)
Chloride: 101 mEq/L (ref 96–112)
Creatinine, Ser: 0.68 mg/dL (ref 0.40–1.50)
GFR: 109.27 mL/min (ref >60.00)
Glucose, Bld: 101 mg/dL — ABNORMAL HIGH (ref 70–99)
Potassium: 3.3 mEq/L — ABNORMAL LOW (ref 3.5–5.1)
Sodium: 143 mEq/L (ref 135–145)
Total Bilirubin: 0.6 mg/dL (ref 0.2–1.2)
Total Protein: 8.3 g/dL (ref 6.0–8.3)

## 2022-09-02 LAB — CBC
HCT: 37.7 % — ABNORMAL LOW (ref 39.0–52.0)
Hemoglobin: 12.3 g/dL — ABNORMAL LOW (ref 13.0–17.0)
MCHC: 32.7 g/dL (ref 30.0–36.0)
MCV: 83.4 fl (ref 78.0–100.0)
Platelets: 99 10*3/uL — ABNORMAL LOW (ref 150.0–400.0)
RBC: 4.53 Mil/uL (ref 4.22–5.81)
RDW: 16.3 % — ABNORMAL HIGH (ref 11.5–15.5)
WBC: 5.8 10*3/uL (ref 4.0–10.5)

## 2022-09-02 LAB — FOLATE: Folate: >23.8 ng/mL (ref >5.9)

## 2022-09-02 MED ORDER — FOLIC ACID 1 MG PO TABS: ORAL_TABLET | ORAL | 3 refills | Status: DC

## 2022-09-02 MED ORDER — GABAPENTIN 600 MG PO TABS: ORAL_TABLET | ORAL | 3 refills | Status: DC

## 2022-09-02 MED ORDER — THIAMINE HCL 100 MG PO TABS: 100.0000 mg | ORAL_TABLET | Freq: Every day | ORAL | 3 refills | Status: DC

## 2022-09-02 MED ORDER — NORTRIPTYLINE HCL 10 MG PO CAPS: 10.0000 mg | ORAL_CAPSULE | Freq: Every day | ORAL | 3 refills | Status: DC

## 2022-09-02 NOTE — Progress Notes (Signed)
NEUROLOGY FOLLOW UP OFFICE NOTE  Samuel Cantrell [id number] 03-22-1975  HISTORY OF PRESENT ILLNESS: I had the pleasure of seeing Samuel Cantrell in follow-up in the neurology clinic on 09/02/2022.He is alone in the office today.  The patient was last seen 8 months ago. He had a seizure in June 2020 (likely alcohol withdrawal), none since then. He is followed in the office for neuropathy. He states symptoms are unchanged, worse in the early morning before he goes to work. After he comes home, there is tightening up in his legs. He denies any burning pain, he has an occasional electric shock sensation. He is on Gabapentin 600mg  2 caps in AM, 3 caps in PM and nortriptyline 10mg  at bedtime without side effects. A massager helps. No neck or back pain, no bowel/bladder dysfunction. Sleep is good. He states he is trying to slow down with the alcohol, drinking around 3 beers a week. He is taking daily thiamine and folic acid. His last LFTs in 09/2021 were elevated, he has not seen his PCP recently. No abdominal pain, nausea/vomiting.       Chemistry      Component Value Date/Time   NA 141 09/25/2021 1003   NA 141 07/29/2018 1025   NA 145 01/30/2017 0952   K 3.2 (L) 09/25/2021 1003   K 4.1 01/30/2017 0952   CL 98 09/25/2021 1003   CL 105 01/30/2017 0952   CO2 33 (H) 09/25/2021 1003   CO2 28 01/30/2017 0952   BUN 7 09/25/2021 1003   BUN 8 07/29/2018 1025   BUN 11 01/30/2017 0952   CREATININE 0.58 09/25/2021 1003   CREATININE 0.66 01/03/2020 0751      Component Value Date/Time   CALCIUM 8.9 09/25/2021 1003   CALCIUM 8.7 01/30/2017 0952   ALKPHOS 128 (H) 09/25/2021 1003   ALKPHOS 162 (H) 01/30/2017 0952   AST 148 (H) 09/25/2021 1003   AST 88 (H) 12/23/2018 1100   ALT 70 (H) 09/25/2021 1003   ALT 47 (H) 12/23/2018 1100   ALT 92 (H) 01/30/2017 0952   BILITOT 0.8 09/25/2021 1003   BILITOT 0.8 12/23/2018 1100       History on Initial Assessment 11/11/2018: This is a 47 year old  right-handed man with a history of hypertension, recurrent DVT, protein C deficiency, alcohol abuse, presenting for evaluation of neuropathy. He also recently had a new onset seizure felt secondary to alcohol withdrawal. He reports symptoms started around 2 months ago with back pain, records indicate this occurred in May 2020. He started having back pain going down his legs with numbness and tingling. The back pain has resolved however the paresthesias continue below his knees down to his toes. Every once in a while there is a pain like sticking with a pin. Hands are not affected. He loses his balance a little. He has urinary frequency, no bowel/bladder incontinence. He denies any falls. No family history of neuropathy. He was seen by Sports Medicine and had tried low dose gabapentin 100mg  qhs with no effect.   He was admitted to [facility] last 09/04/2018 for seizures felt secondary to alcohol withdrawal. His wife reports he came out of the bathroom and sat on the couch, then she heard a loud yell and found him with body tensed up, he had fallen off the couch. She reports he stopped breathing for a few minutes and she performed CPR, during which time his eyes opened and he could answer questions but did  not remember what happened. He did not realize he had another GTC in the ER. CT head no acute changes. Per report he normally drinks 2-3 shots per day (today he reports 5-6 shots a day) then stopped abruptly few days prior. He had severe hypokalemia and hypomagnesemia, low B12, B1, and folate levels. He has been on replacement therapy since then. His wife reports he was very disoriented, he was zoning out when he first got home, this has not occurred since. She denies any staring/unresponsive episodes, he denies any other gaps in time, olfactory/gustatory hallucinations, deja vu, rising epigastric sensation, focal weakness. He has occasional jerking of his legs. He states he has cut down on alcohol,  he is not drinking vodka any longer but still drinks 3 beers daily. He feels dizzy if he stands for a prolonged period, his BP drops to 90/50.  He had a normal birth and early development.  There is no history of febrile convulsions, CNS infections such as meningitis/encephalitis, significant traumatic brain injury, neurosurgical procedures, or family history of seizures.   PAST MEDICAL HISTORY: Past Medical History:  Diagnosis Date   Allergy    GERD (gastroesophageal reflux disease)    Hypertensive urgency 02/28/2016    MEDICATIONS: Current Outpatient Medications on File Prior to Visit  Medication Sig Dispense Refill   carvedilol (COREG) 12.5 MG tablet TAKE 1 TABLET(12.5 MG) BY MOUTH TWICE DAILY WITH A MEAL 180 tablet 0   diphenhydrAMINE (BENADRYL) 25 MG tablet Take 25 mg by mouth every 6 (six) hours as needed for itching or allergies.      folic acid (FOLVITE) 1 MG tablet TAKE 1 TABLET(1 MG) BY MOUTH DAILY 90 tablet 1   gabapentin (NEURONTIN) 600 MG tablet Take 2 caps in AM, 3 caps in PM 450 tablet 3   nortriptyline (PAMELOR) 10 MG capsule Take 1 capsule (10 mg total) by mouth at bedtime. 90 capsule 3   potassium chloride (KLOR-CON M) 10 MEQ tablet Take 1 tablet (10 mEq total) by mouth daily. 14 tablet 0   thiamine (VITAMIN B1) 100 MG tablet Take 1 tablet (100 mg total) by mouth daily. 90 tablet 3   XARELTO 10 MG TABS tablet TAKE 1 TABLET(10 MG) BY MOUTH DAILY 30 tablet 6   No current facility-administered medications on file prior to visit.    ALLERGIES: No Known Allergies  FAMILY HISTORY: Family History  Problem Relation Age of Onset   Hypertension Mother     SOCIAL HISTORY: Social History   Socioeconomic History   Marital status: Married    Spouse name: Not on file   Number of children: Not on file   Years of education: Not on file   Highest education level: Not on file  Occupational History   Occupation: Astronomer  Tobacco Use   Smoking status: Every Day     Packs/day: 0.50    Years: 8.00    Additional pack years: 0.00    Total pack years: 4.00    Types: Cigarettes   Smokeless tobacco: Never  Vaping Use   Vaping Use: Never used  Substance and Sexual Activity   Alcohol use: Yes    Comment: occ   Drug use: No   Sexual activity: Not on file  Other Topics Concern   Not on file  Social History Narrative   Pt lives with wife.   Right handed   Highest level of edu- 3 years of colleg   Social Determinants of Health   Financial Resource Strain:  Not on file  Food Insecurity: Not on file  Transportation Needs: Not on file  Physical Activity: Not on file  Stress: Not on file  Social Connections: Not on file  Intimate Partner Violence: Not on file     PHYSICAL EXAM: Vitals:   09/02/22 0829 09/02/22 0832  BP: (!) 149/93 (!) 151/94  Pulse: 89   SpO2: 98%    General: No acute distress Head:  Normocephalic/atraumatic Skin/Extremities: No rash, no edema Neurological Exam: alert and awake. No aphasia or dysarthria. Fund of knowledge is appropriate. Attention and concentration are normal.   Cranial nerves: Pupils equal, round. Extraocular movements intact with no nystagmus. Visual fields full.  No facial asymmetry.  Motor: Bulk and tone normal, muscle strength 5/5 throughout with no pronator drift. Sensation intact to all modalities on both UE and LE. Reflexes unable to elicit throughout. Finger to nose testing intact.  Gait narrow-based and steady, difficulty with tandem walk. 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 Neuropathy overall stable on Gabapentin 600mg  2 caps in AM, 3 caps in PM and nortriptyline 10mg  at bedtime, refills sent. We again discussed effects of alcohol on neuropathy. Continue daily folic acid and thiamine. His LFTs were elevated a year ago, recheck CBC, CMP,  thiamine, folic acid today. No further seizures since 2020, likely due to alcohol withdrawal. Follow-up with PCP on elevated BP and LFTs. He is aware of [place] driving laws to stop driving after a seizure until 6 months seizure-free. Follow-up in 1 year, call for any changes.     Thank you for allowing me to participate in his care.  Please do not hesitate to call for any questions or concerns.    Patrcia Dolly, M.D.   CC: Esperanza Richters, PA-C

## 2022-09-02 NOTE — Patient Instructions (Addendum)
Good to see you.  Have bloodwork done for CBC, CMP, thiamine, folic acid  2. Continue Gabapentin 600mg : take 2 caps in AM, 3 caps in PM  3. Continue nortriptyline 10mg  every night  4. Continue daily folic acid and thiamine  5. Continue on gradually reducing alcohol since it can affect nerves and worsen neuropathy  6. Follow-up in 1 year, call for any changes

## 2022-09-06 LAB — VITAMIN B1: Vitamin B1 (Thiamine): <6 nmol/L — ABNORMAL LOW (ref 8–30)

## 2022-09-09 ENCOUNTER — Telehealth: Payer: Self-pay

## 2022-09-09 NOTE — Telephone Encounter (Signed)
Follow up letter made

## 2022-09-10 ENCOUNTER — Telehealth: Payer: Self-pay

## 2022-09-10 NOTE — Telephone Encounter (Signed)
Pt called an informed that B1 level is very low. Dr Karel Jarvis  had sent in refills for his vitamin B1, he should take it regularly. His bloodwork was also abnormal with low platelet count and elevated liver function, he needs to follow-up with his PCP for these, Dr Karel Jarvis forwarded labs to PCP as well. Pls have patient call PCP to schedule f/u.

## 2022-09-10 NOTE — Telephone Encounter (Signed)
-----   Message from Van Clines, MD sent at 09/08/2022  2:08 PM EDT ----- Samuel Cantrell, pls let him know the B1 level is very low. I had sent in refills for his vitamin B1, he should take it regularly. His bloodwork was also abnormal with low platelet count and elevated liver function, he needs to follow-up with his PCP for these, I forwarded labs to PCP as well. Pls have patient call PCP to schedule f/u. Thanks

## 2022-10-15 ENCOUNTER — Other Ambulatory Visit: Payer: Self-pay | Admitting: Neurology

## 2022-10-18 ENCOUNTER — Other Ambulatory Visit: Payer: Self-pay | Admitting: Medical

## 2022-10-20 ENCOUNTER — Other Ambulatory Visit: Payer: Self-pay | Admitting: Medical

## 2022-10-20 NOTE — Telephone Encounter (Signed)
Medication: Carvedilol (Coreg) 12.5 mg  Directions: Take 1 tablet by mouth 2 times a day  Last given: 06/02/22 Number refills: 0 Last o/v: 11/13/2020 Follow up: Not on file  Labs:

## 2022-10-20 NOTE — Telephone Encounter (Signed)
Approaching 2 years since last saw pt. Gave rx refill carvedilol for one month. He needs to follow up with me within a month before his rx expires.

## 2022-10-22 ENCOUNTER — Telehealth: Payer: Self-pay | Admitting: Medical

## 2022-10-22 NOTE — Telephone Encounter (Signed)
The patient called and mentioned that his pharmacy informed him they were still processing his prescription for carvedilol (COREG) 12.5 MG tablets, leading him to believe that [facility] hadn't submitted it. I assured him that [facility] did send the prescription yesterday and advised him to contact [facility] to confirm if they received it today.

## 2022-10-31 NOTE — Progress Notes (Signed)
This encounter was created in error - please disregard.

## 2022-12-05 ENCOUNTER — Other Ambulatory Visit: Payer: Self-pay | Admitting: Medical

## 2023-01-08 ENCOUNTER — Other Ambulatory Visit: Payer: Self-pay | Admitting: Neurology

## 2023-01-17 ENCOUNTER — Other Ambulatory Visit: Payer: Self-pay | Admitting: Medical

## 2023-01-19 ENCOUNTER — Telehealth: Payer: Self-pay | Admitting: Medical

## 2023-01-19 NOTE — Telephone Encounter (Signed)
Pt called and lvm to return call 

## 2023-01-19 NOTE — Telephone Encounter (Signed)
Pt made an appt in January but is requesting a refill on carvedilol (COREG) 12.5 MG tablet. States he cannot take anymore time off from work.

## 2023-03-05 ENCOUNTER — Other Ambulatory Visit: Payer: Self-pay | Admitting: Medical

## 2023-03-07 ENCOUNTER — Other Ambulatory Visit: Payer: Self-pay | Admitting: Neurology

## 2023-03-17 ENCOUNTER — Other Ambulatory Visit: Payer: Self-pay | Admitting: Hematology & Oncology

## 2023-03-18 ENCOUNTER — Ambulatory Visit: Payer: BC Managed Care – PPO | Admitting: Medical

## 2023-03-18 ENCOUNTER — Other Ambulatory Visit: Payer: Self-pay

## 2023-03-18 MED ORDER — RIVAROXABAN 10 MG PO TABS: 10.0000 mg | ORAL_TABLET | Freq: Every day | ORAL | 0 refills | Status: DC

## 2023-03-18 NOTE — Telephone Encounter (Signed)
 Patient called stating he went to pick up xarelto  from [facility] yesterday and they informed him he needed to call our office.   Called [facility] who states they had to rerun patients insurance and his new insurance is not contracting with [facility] and they recommended we send it to [facility].  Called patient and informed him of needing to change pharmacy. New pharmacy provided by [facility] for [facility] in [place].   Patient also informed we haven't seen him since 2020 and need to get him back in the office to see a provider to be able refill his prescription further. Informed him we would send in a 30 day prescription to get him to his next appt at our office. Patient was in agreement and knows scheduling will reach out to him today to set up an appointment.

## 2023-03-30 ENCOUNTER — Other Ambulatory Visit: Payer: Self-pay | Admitting: Medical Oncology

## 2023-03-30 DIAGNOSIS — Z86718 Personal history of other venous thrombosis and embolism: Secondary | ICD-10-CM

## 2023-03-31 ENCOUNTER — Inpatient Hospital Stay: Payer: 59 | Admitting: Medical Oncology

## 2023-03-31 ENCOUNTER — Inpatient Hospital Stay: Payer: 59 | Attending: Hematology & Oncology

## 2023-03-31 ENCOUNTER — Ambulatory Visit: Payer: BC Managed Care – PPO | Admitting: Medical

## 2023-04-06 ENCOUNTER — Encounter: Payer: Self-pay | Admitting: Medical

## 2023-04-06 ENCOUNTER — Ambulatory Visit (INDEPENDENT_AMBULATORY_CARE_PROVIDER_SITE_OTHER): Payer: 59 | Admitting: Medical

## 2023-04-06 VITALS — BP 112/60 | HR 100 | Resp 18 | Ht 68.0 in | Wt 119.0 lb

## 2023-04-06 DIAGNOSIS — Z125 Encounter for screening for malignant neoplasm of prostate: Secondary | ICD-10-CM | POA: Diagnosis not present

## 2023-04-06 DIAGNOSIS — R55 Syncope and collapse: Secondary | ICD-10-CM

## 2023-04-06 DIAGNOSIS — R748 Abnormal levels of other serum enzymes: Secondary | ICD-10-CM

## 2023-04-06 DIAGNOSIS — K828 Other specified diseases of gallbladder: Secondary | ICD-10-CM

## 2023-04-06 DIAGNOSIS — K769 Liver disease, unspecified: Secondary | ICD-10-CM

## 2023-04-06 DIAGNOSIS — Z1211 Encounter for screening for malignant neoplasm of colon: Secondary | ICD-10-CM

## 2023-04-06 DIAGNOSIS — I10 Essential (primary) hypertension: Secondary | ICD-10-CM | POA: Diagnosis not present

## 2023-04-06 DIAGNOSIS — D649 Anemia, unspecified: Secondary | ICD-10-CM

## 2023-04-06 DIAGNOSIS — Z0000 Encounter for general adult medical examination without abnormal findings: Secondary | ICD-10-CM | POA: Diagnosis not present

## 2023-04-06 DIAGNOSIS — K7460 Unspecified cirrhosis of liver: Secondary | ICD-10-CM

## 2023-04-06 DIAGNOSIS — E875 Hyperkalemia: Secondary | ICD-10-CM

## 2023-04-06 DIAGNOSIS — K8020 Calculus of gallbladder without cholecystitis without obstruction: Secondary | ICD-10-CM

## 2023-04-06 LAB — CBC WITH DIFFERENTIAL/PLATELET
Basophils Absolute: 0.1 10*3/uL (ref 0.0–0.1)
Basophils Relative: 1 % (ref 0.0–3.0)
Eosinophils Absolute: 0.1 10*3/uL (ref 0.0–0.7)
Eosinophils Relative: 1 % (ref 0.0–5.0)
HCT: 31.7 % — ABNORMAL LOW (ref 39.0–52.0)
Hemoglobin: 10.2 g/dL — ABNORMAL LOW (ref 13.0–17.0)
Lymphocytes Relative: 22.9 % (ref 12.0–46.0)
Lymphs Abs: 1.9 10*3/uL (ref 0.7–4.0)
MCHC: 32.2 g/dL (ref 30.0–36.0)
MCV: 81.4 fL (ref 78.0–100.0)
Monocytes Absolute: 1.1 10*3/uL — ABNORMAL HIGH (ref 0.1–1.0)
Monocytes Relative: 13.3 % — ABNORMAL HIGH (ref 3.0–12.0)
Neutro Abs: 5 10*3/uL (ref 1.4–7.7)
Neutrophils Relative %: 61.8 % (ref 43.0–77.0)
Platelets: 85 10*3/uL — ABNORMAL LOW (ref 150.0–400.0)
RBC: 3.9 Mil/uL — ABNORMAL LOW (ref 4.22–5.81)
RDW: 17.4 % — ABNORMAL HIGH (ref 11.5–15.5)
WBC: 8.2 10*3/uL (ref 4.0–10.5)

## 2023-04-06 LAB — COMPREHENSIVE METABOLIC PANEL
ALT: 43 U/L (ref 0–53)
AST: 151 U/L — ABNORMAL HIGH (ref 0–37)
Albumin: 3.8 g/dL (ref 3.5–5.2)
Alkaline Phosphatase: 160 U/L — ABNORMAL HIGH (ref 39–117)
BUN: 7 mg/dL (ref 6–23)
CO2: 30 meq/L (ref 19–32)
Calcium: 8.1 mg/dL — ABNORMAL LOW (ref 8.4–10.5)
Chloride: 97 meq/L (ref 96–112)
Creatinine, Ser: 0.57 mg/dL (ref 0.40–1.50)
GFR: 114.78 mL/min (ref >60.00)
Glucose, Bld: 105 mg/dL — ABNORMAL HIGH (ref 70–99)
Potassium: 3.2 meq/L — ABNORMAL LOW (ref 3.5–5.1)
Sodium: 141 meq/L (ref 135–145)
Total Bilirubin: 0.9 mg/dL (ref 0.2–1.2)
Total Protein: 7.8 g/dL (ref 6.0–8.3)

## 2023-04-06 LAB — LIPID PANEL
Cholesterol: 151 mg/dL (ref 0–200)
HDL: 62.1 mg/dL (ref >39.00)
LDL Cholesterol: 75 mg/dL (ref 0–99)
NonHDL: 89
Total CHOL/HDL Ratio: 2
Triglycerides: 68 mg/dL (ref 0.0–149.0)
VLDL: 13.6 mg/dL (ref 0.0–40.0)

## 2023-04-06 LAB — PSA: PSA: 1.35 ng/mL (ref 0.10–4.00)

## 2023-04-06 NOTE — Progress Notes (Signed)
Subjective:    Patient ID: Samuel Cantrell, male    DOB: 1975-07-01, 48 y.o.   MRN: [id number]  HPI   Decided to do wellness as well as address chronic complaints. Not see in some time.  Pt not working presently. Pt states recent healthy. Pt pt still smoking. 3-4 cigarettes a day. 20 years maybe half a pack a day. Pt states still drinking alcohol. Now 6-12 beers in one week. No caffeine beverages.  Pt declines flu vaccine. Pt also declines pneumonia vaccine.   Referral to colonoscopy  Will get psa with screening labs today.       Review of Systems  Constitutional:  Negative for chills and fever.  Respiratory:  Negative for cough, chest tightness, shortness of breath and wheezing.   Cardiovascular:  Negative for chest pain and palpitations.  Gastrointestinal:  Negative for abdominal pain.  Genitourinary:  Negative for dysuria, frequency, hematuria and penile swelling.  Musculoskeletal:  Negative for back pain and joint swelling.  Skin:  Negative for rash.  Neurological:  Negative for dizziness, weakness and numbness.  Hematological:  Negative for adenopathy. Does not bruise/bleed easily.  Psychiatric/Behavioral:  Negative for behavioral problems, dysphoric mood and sleep disturbance.     Past Medical History:  Diagnosis Date   Allergy    GERD (gastroesophageal reflux disease)    Hypertensive urgency 02/28/2016     Social History   Socioeconomic History   Marital status: Married    Spouse name: Not on file   Number of children: Not on file   Years of education: Not on file   Highest education level: Not on file  Occupational History   Occupation: Astronomer  Tobacco Use   Smoking status: Every Day    Current packs/day: 0.50    Average packs/day: 0.5 packs/day for 8.0 years (4.0 ttl pk-yrs)    Types: Cigarettes   Smokeless tobacco: Never  Vaping Use   Vaping status: Never Used  Substance and Sexual Activity   Alcohol use: Yes    Comment: occ    Drug use: No   Sexual activity: Not on file  Other Topics Concern   Not on file  Social History Narrative   Pt lives with wife.   Right handed   Highest level of edu- 3 years of colleg   Social Drivers of Health   Financial Resource Strain: Not on file  Food Insecurity: Not on file  Transportation Needs: Not on file  Physical Activity: Not on file  Stress: Not on file  Social Connections: Unknown (07/22/2021)   Received from [facility], [facility]   Social Network    Social Network: Not on file  Intimate Partner Violence: Unknown (06/13/2021)   Received from [facility], [facility]   HITS    Physically Hurt: Not on file    Insult or Talk Down To: Not on file    Threaten Physical Harm: Not on file    Scream or Curse: Not on file    Past Surgical History:  Procedure Laterality Date   NO PAST SURGERIES      Family History  Problem Relation Age of Onset   Hypertension Mother     No Known Allergies  Current Outpatient Medications on File Prior to Visit  Medication Sig Dispense Refill   carvedilol (COREG) 12.5 MG tablet TAKE 1 TABLET(12.5 MG) BY MOUTH TWICE DAILY WITH A MEAL 180 tablet 0   diphenhydrAMINE (BENADRYL) 25 MG tablet Take 25 mg by mouth every  6 (six) hours as needed for itching or allergies.      folic acid (FOLVITE) 1 MG tablet TAKE 1 TABLET BY MOUTH DAILY 90 tablet 1   gabapentin (NEURONTIN) 600 MG tablet Take 2 caps in AM, 3 caps in PM 450 tablet 3   nortriptyline (PAMELOR) 10 MG capsule TAKE 1 CAPSULE(10 MG) BY MOUTH AT BEDTIME 90 capsule 1   potassium chloride (KLOR-CON M) 10 MEQ tablet Take 1 tablet (10 mEq total) by mouth daily. 14 tablet 0   rivaroxaban (XARELTO) 10 MG TABS tablet Take 1 tablet (10 mg total) by mouth daily. 30 tablet 0   thiamine (VITAMIN B1) 100 MG tablet Take 1 tablet (100 mg total) by mouth daily. 90 tablet 3   No current facility-administered medications on file prior to visit.    BP 112/60   Pulse 100   Resp 18   Ht  5\' 8"  (1.727 m)   Wt 119 lb (54 kg)   SpO2 100%   BMI 18.09 kg/m        Objective:   Physical Exam        Assessment & Plan:   Patient Instructions  For you wellness exam today I have ordered cbc, psa cmp and  lipid panel.  Vaccines decline  Recommend exercise and healthy diet.  We will let you know lab results as they come in.  Follow up date appointment will be determined after lab review.    Hypertension Well controlled on Carvedilol 12.5mg  twice daily. Noted low blood pressure readings today (112/60). Discussed the possibility of reducing the dose with the cardiologist. -Continue Carvedilol 12.5mg  twice daily. -Refill Carvedilol prescription. -Consult with cardiologist regarding dose adjustment.  Peripheral Neuropathy Managed with Gabapentin 2 tablets in the morning and 3 at night. -Continue Gabapentin as prescribed.  Deep Vein Thrombosis (DVT) history On Xarelto for anticoagulation. -Continue Xarelto as prescribed.  Vasovagal Syncope Episode of brief possible  syncope likely secondary to straining and low blood pressure. -Advise to avoid straining and to rise slowly from a seated position to prevent future episodes. -Get   tomorrow in regards whether lower dose of carvedilol indicated. -Stay well hydrated. -Based on history given and circumstance do not think syncope work up indicated. Note pt is already established with neurology and cardiology.  Follow up date with me to be determined after lab review.    Esperanza Richters, [place]   [id number] charge as did address  htn with recent lower bp readings, dvt history, peripheral neuropathy and recent vasovagal episode.

## 2023-04-06 NOTE — Addendum Note (Signed)
Addended by: Gwenevere Abbot on: 04/06/2023 11:28 AM   Modules accepted: Orders

## 2023-04-06 NOTE — Patient Instructions (Addendum)
For you wellness exam today I have ordered cbc, psa cmp and  lipid panel.  Vaccines decline  Recommend exercise and healthy diet.  We will let you know lab results as they come in.  Follow up date appointment will be determined after lab review.    Hypertension Well controlled on Carvedilol 12.5mg  twice daily. Noted low blood pressure readings today (112/60). Discussed the possibility of reducing the dose with the cardiologist. -Continue Carvedilol 12.5mg  twice daily. -Refill Carvedilol prescription. -Consult with cardiologist regarding dose adjustment.  Peripheral Neuropathy Managed with Gabapentin 2 tablets in the morning and 3 at night. -Continue Gabapentin as prescribed.  Deep Vein Thrombosis (DVT) history On Xarelto for anticoagulation. -Continue Xarelto as prescribed.  Vasovagal Syncope Episode of brief possible  syncope likely secondary to straining and low blood pressure. -Advise to avoid straining and to rise slowly from a seated position to prevent future episodes. -Get   tomorrow in regards whether lower dose of carvedilol indicated. -Stay well hydrated. -Based on history given and circumstance do not think syncope work up indicated. Note pt is already established with neurology and cardiology.  Follow up date with me to be determined after lab review.  Preventive Care 68-19 Years Old, Male Preventive care refers to lifestyle choices and visits with your health care provider that can promote health and wellness. Preventive care visits are also called wellness exams. What can I expect for my preventive care visit? Counseling During your preventive care visit, your health care provider may ask about your: Medical history, including: Past medical problems. Family medical history. Current health, including: Emotional well-being. Home life and relationship well-being. Sexual activity. Lifestyle, including: Alcohol, nicotine or tobacco, and drug use. Access to  firearms. Diet, exercise, and sleep habits. Safety issues such as seatbelt and bike helmet use. Sunscreen use. Work and work Astronomer. Physical exam Your health care provider will check your: Height and weight. These may be used to calculate your BMI (body mass index). BMI is a measurement that tells if you are at a healthy weight. Waist circumference. This measures the distance around your waistline. This measurement also tells if you are at a healthy weight and may help predict your risk of certain diseases, such as type 2 diabetes and high blood pressure. Heart rate and blood pressure. Body temperature. Skin for abnormal spots. What immunizations do I need?  Vaccines are usually given at various ages, according to a schedule. Your health care provider will recommend vaccines for you based on your age, medical history, and lifestyle or other factors, such as travel or where you work. What tests do I need? Screening Your health care provider may recommend screening tests for certain conditions. This may include: Lipid and cholesterol levels. Diabetes screening. This is done by checking your blood sugar (glucose) after you have not eaten for a while (fasting). Hepatitis B test. Hepatitis C test. HIV (human immunodeficiency virus) test. STI (sexually transmitted infection) testing, if you are at risk. Lung cancer screening. Prostate cancer screening. Colorectal cancer screening. Talk with your health care provider about your test results, treatment options, and if necessary, the need for more tests. Follow these instructions at home: Eating and drinking  Eat a diet that includes fresh fruits and vegetables, whole grains, lean protein, and low-fat dairy products. Take vitamin and mineral supplements as recommended by your health care provider. Do not drink alcohol if your health care provider tells you not to drink. If you drink alcohol: Limit how much you have to  0-2 drinks a  day. Know how much alcohol is in your drink. In the [place] one drink equals one 12 oz bottle of beer (355 mL), one 5 oz glass of wine (148 mL), or one 1 oz glass of hard liquor (44 mL). Lifestyle Brush your teeth every morning and night with fluoride toothpaste. Floss one time each day. Exercise for at least 30 minutes 5 or more days each week. Do not use any products that contain nicotine or tobacco. These products include cigarettes, chewing tobacco, and vaping devices, such as e-cigarettes. If you need help quitting, ask your health care provider. Do not use drugs. If you are sexually active, practice safe sex. Use a condom or other form of protection to prevent STIs. Take aspirin only as told by your health care provider. Make sure that you understand how much to take and what form to take. Work with your health care provider to find out whether it is safe and beneficial for you to take aspirin daily. Find healthy ways to manage stress, such as: Meditation, yoga, or listening to music. Journaling. Talking to a trusted person. Spending time with friends and family. Minimize exposure to UV radiation to reduce your risk of skin cancer. Safety Always wear your seat belt while driving or riding in a vehicle. Do not drive: If you have been drinking alcohol. Do not ride with someone who has been drinking. When you are tired or distracted. While texting. If you have been using any mind-altering substances or drugs. Wear a helmet and other protective equipment during sports activities. If you have firearms in your house, make sure you follow all gun safety procedures. What's next? Go to your health care provider once a year for an annual wellness visit. Ask your health care provider how often you should have your eyes and teeth checked. Stay up to date on all vaccines. This information is not intended to replace advice given to you by your health care provider. Make sure you discuss any  questions you have with your health care provider. Document Revised: 08/22/2020 Document Reviewed: 08/22/2020 Elsevier Patient Education  2024 [employer].

## 2023-04-07 ENCOUNTER — Inpatient Hospital Stay: Payer: 59

## 2023-04-07 ENCOUNTER — Encounter: Payer: Self-pay | Admitting: Medical

## 2023-04-07 ENCOUNTER — Other Ambulatory Visit (HOSPITAL_BASED_OUTPATIENT_CLINIC_OR_DEPARTMENT_OTHER): Payer: Self-pay

## 2023-04-07 ENCOUNTER — Inpatient Hospital Stay: Payer: 59 | Admitting: Medical Oncology

## 2023-04-07 MED ORDER — SODIUM POLYSTYRENE SULFONATE 15 GM/60ML CO SUSP
0 refills | Status: DC
  Filled 2023-04-07: qty 240, 4d supply, fill #0

## 2023-04-07 MED ORDER — POTASSIUM CHLORIDE CRYS ER 10 MEQ PO TBCR: EXTENDED_RELEASE_TABLET | ORAL | 0 refills | Status: DC

## 2023-04-07 NOTE — Addendum Note (Signed)
Addended by: Gwenevere Abbot on: 04/07/2023 09:08 AM   Modules accepted: Orders

## 2023-04-07 NOTE — Addendum Note (Signed)
Addended by: Gwenevere Abbot on: 04/07/2023 08:54 AM   Modules accepted: Orders

## 2023-04-07 NOTE — Addendum Note (Signed)
Addended by: Gwenevere Abbot on: 04/07/2023 09:05 AM   Modules accepted: Orders

## 2023-04-10 ENCOUNTER — Other Ambulatory Visit (INDEPENDENT_AMBULATORY_CARE_PROVIDER_SITE_OTHER): Payer: Self-pay

## 2023-04-10 DIAGNOSIS — E875 Hyperkalemia: Secondary | ICD-10-CM

## 2023-04-10 LAB — COMPREHENSIVE METABOLIC PANEL
ALT: 42 U/L (ref 0–53)
AST: 162 U/L — ABNORMAL HIGH (ref 0–37)
Albumin: 3.8 g/dL (ref 3.5–5.2)
Alkaline Phosphatase: 159 U/L — ABNORMAL HIGH (ref 39–117)
BUN: 7 mg/dL (ref 6–23)
CO2: 32 meq/L (ref 19–32)
Calcium: 8.1 mg/dL — ABNORMAL LOW (ref 8.4–10.5)
Chloride: 99 meq/L (ref 96–112)
Creatinine, Ser: 0.59 mg/dL (ref 0.40–1.50)
GFR: 113.58 mL/min (ref >60.00)
Glucose, Bld: 105 mg/dL — ABNORMAL HIGH (ref 70–99)
Potassium: 3.2 meq/L — ABNORMAL LOW (ref 3.5–5.1)
Sodium: 144 meq/L (ref 135–145)
Total Bilirubin: 0.9 mg/dL (ref 0.2–1.2)
Total Protein: 7.8 g/dL (ref 6.0–8.3)

## 2023-04-11 ENCOUNTER — Encounter: Payer: Self-pay | Admitting: Medical

## 2023-04-11 MED ORDER — POTASSIUM CHLORIDE CRYS ER 20 MEQ PO TBCR: 20.0000 meq | EXTENDED_RELEASE_TABLET | Freq: Every day | ORAL | 0 refills | Status: DC

## 2023-04-11 NOTE — Addendum Note (Signed)
Addended by: Gwenevere Abbot on: 04/11/2023 07:12 AM   Modules accepted: Orders

## 2023-04-12 ENCOUNTER — Ambulatory Visit (HOSPITAL_BASED_OUTPATIENT_CLINIC_OR_DEPARTMENT_OTHER)
Admission: RE | Admit: 2023-04-12 | Discharge: 2023-04-12 | Disposition: A | Discharge status: Home / Self Care | Payer: Self-pay | Source: Ambulatory Visit | Attending: Medical | Admitting: Medical

## 2023-04-12 DIAGNOSIS — R748 Abnormal levels of other serum enzymes: Secondary | ICD-10-CM | POA: Insufficient documentation

## 2023-04-13 ENCOUNTER — Encounter: Payer: Self-pay | Admitting: Medical

## 2023-04-13 NOTE — Addendum Note (Signed)
Addended by: Gwenevere Abbot on: 04/13/2023 10:20 PM   Modules accepted: Orders

## 2023-04-13 NOTE — Addendum Note (Signed)
Addended by: Gwenevere Abbot on: 04/13/2023 10:27 PM   Modules accepted: Orders

## 2023-04-16 ENCOUNTER — Encounter: Payer: Self-pay | Admitting: Medical Oncology

## 2023-04-16 ENCOUNTER — Inpatient Hospital Stay: Payer: Self-pay | Attending: Hematology & Oncology

## 2023-04-16 ENCOUNTER — Encounter: Payer: Self-pay | Admitting: *Deleted

## 2023-04-16 ENCOUNTER — Inpatient Hospital Stay (HOSPITAL_BASED_OUTPATIENT_CLINIC_OR_DEPARTMENT_OTHER): Payer: Self-pay | Admitting: Medical Oncology

## 2023-04-16 VITALS — BP 138/83 | HR 88 | Temp 98.7°F | Resp 18 | Ht 68.0 in | Wt 117.0 lb

## 2023-04-16 DIAGNOSIS — D6862 Lupus anticoagulant syndrome: Secondary | ICD-10-CM | POA: Diagnosis not present

## 2023-04-16 DIAGNOSIS — Z86718 Personal history of other venous thrombosis and embolism: Secondary | ICD-10-CM

## 2023-04-16 DIAGNOSIS — Z789 Other specified health status: Secondary | ICD-10-CM

## 2023-04-16 DIAGNOSIS — Z7901 Long term (current) use of anticoagulants: Secondary | ICD-10-CM | POA: Insufficient documentation

## 2023-04-16 DIAGNOSIS — D649 Anemia, unspecified: Secondary | ICD-10-CM | POA: Insufficient documentation

## 2023-04-16 DIAGNOSIS — E639 Nutritional deficiency, unspecified: Secondary | ICD-10-CM

## 2023-04-16 DIAGNOSIS — F17210 Nicotine dependence, cigarettes, uncomplicated: Secondary | ICD-10-CM

## 2023-04-16 DIAGNOSIS — D6859 Other primary thrombophilia: Secondary | ICD-10-CM

## 2023-04-16 LAB — CBC WITH DIFFERENTIAL (CANCER CENTER ONLY)
Abs Immature Granulocytes: 0.03 10*3/uL (ref 0.00–0.07)
Basophils Absolute: 0.1 10*3/uL (ref 0.0–0.1)
Basophils Relative: 2 %
Eosinophils Absolute: 0.2 10*3/uL (ref 0.0–0.5)
Eosinophils Relative: 2 %
HCT: 31.9 % — ABNORMAL LOW (ref 39.0–52.0)
Hemoglobin: 10.5 g/dL — ABNORMAL LOW (ref 13.0–17.0)
Immature Granulocytes: 0 %
Lymphocytes Relative: 30 %
Lymphs Abs: 2.2 10*3/uL (ref 0.7–4.0)
MCH: 25.8 pg — ABNORMAL LOW (ref 26.0–34.0)
MCHC: 32.9 g/dL (ref 30.0–36.0)
MCV: 78.4 fL — ABNORMAL LOW (ref 80.0–100.0)
Monocytes Absolute: 0.8 10*3/uL (ref 0.1–1.0)
Monocytes Relative: 11 %
Neutro Abs: 4.1 10*3/uL (ref 1.7–7.7)
Neutrophils Relative %: 55 %
Platelet Count: 108 10*3/uL — ABNORMAL LOW (ref 150–400)
RBC: 4.07 MIL/uL — ABNORMAL LOW (ref 4.22–5.81)
RDW: 19.6 % — ABNORMAL HIGH (ref 11.5–15.5)
WBC Count: 7.5 10*3/uL (ref 4.0–10.5)
nRBC: 0.3 % — ABNORMAL HIGH (ref 0.0–0.2)

## 2023-04-16 LAB — CMP (CANCER CENTER ONLY)
ALT: 48 U/L — ABNORMAL HIGH (ref 0–44)
AST: 181 U/L (ref 15–41)
Albumin: 4 g/dL (ref 3.5–5.0)
Alkaline Phosphatase: 132 U/L — ABNORMAL HIGH (ref 38–126)
Anion gap: 13 (ref 5–15)
BUN: 5 mg/dL — ABNORMAL LOW (ref 6–20)
CO2: 29 mmol/L (ref 22–32)
Calcium: 8.7 mg/dL — ABNORMAL LOW (ref 8.9–10.3)
Chloride: 102 mmol/L (ref 98–111)
Creatinine: 0.57 mg/dL — ABNORMAL LOW (ref 0.61–1.24)
GFR, Estimated: >60 mL/min (ref >60)
Glucose, Bld: 99 mg/dL (ref 70–99)
Potassium: 3.9 mmol/L (ref 3.5–5.1)
Sodium: 144 mmol/L (ref 135–145)
Total Bilirubin: 0.9 mg/dL (ref 0.0–1.2)
Total Protein: 8.7 g/dL — ABNORMAL HIGH (ref 6.5–8.1)

## 2023-04-16 LAB — IRON AND IRON BINDING CAPACITY (CC-WL,HP ONLY)
Iron: 32 ug/dL — ABNORMAL LOW (ref 45–182)
Saturation Ratios: 6 % — ABNORMAL LOW (ref 17.9–39.5)
TIBC: 574 ug/dL — ABNORMAL HIGH (ref 250–450)
UIBC: 542 ug/dL — ABNORMAL HIGH (ref 117–376)

## 2023-04-16 LAB — FERRITIN: Ferritin: 36 ng/mL (ref 24–336)

## 2023-04-16 LAB — LACTATE DEHYDROGENASE: LDH: 281 U/L — ABNORMAL HIGH (ref 98–192)

## 2023-04-16 LAB — VITAMIN B12: Vitamin B-12: 1078 pg/mL — ABNORMAL HIGH (ref 180–914)

## 2023-04-16 NOTE — Progress Notes (Signed)
 Critical result received from lab of AST [address], [place] aware and no new orders received.

## 2023-04-16 NOTE — Progress Notes (Signed)
 Hematology and Oncology Follow Up Visit  Samuel Cantrell [id number] [birth year] 48 y.o. 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    Interim History: Samuel Cantrell is here today to re-establish care:  We previously followed him for his history of Protein C deficiency, positive lupus anticoagulant and history of DVT for which he takes Xarelto  10 mg daily. Of note he did have a repeat US  of his arm which did not show any residual thrombus in the left brachial vein.He was recently found to be anemic and was referred back to our office.   Today he reports that he is doing well. He denies fatigue. No SOB, no chest pain. No craving of ice.   There has been no bleeding to his knowledge: denies epistaxis, gingivitis, hemoptysis, hematemesis, hematuria, melena, excessive bruising, blood donation.   He has never had a colonoscopy. He does have pending Hemoccult card testing which was ordered by his PCP. He has an upcoming MRI of his gallbladder on the 16th of this month. Of note he had negative Hepatitis B/C screenings in 2020. No new partners or blood exposures since this time. HIV screening 8 years ago was negative.   He drinks ETOH. 2-3 shots of liquor per day at its most and 1 shot per day on the low end.   He says he smokes about 5 cigarettes a day.  There is been no seizures.  He has had no syncopal episodes.  He has had no nausea or vomiting.  Appetite and weight are good. He denies abdominal pains, new GERD.    Overall, I would have to say that his performance status is ECOG 1. Wt Readings from Last 3 Encounters:  04/16/23 117 lb (53.1 kg)  04/06/23 119 lb (54 kg)  09/02/22 121 lb 6.4 oz (55.1 kg)   Medications:  Allergies as  of 04/16/2023   No Known Allergies      Medication List        Accurate as of April 16, 2023 11:14 AM. If you have any questions, ask your nurse or doctor.          carvedilol  12.5 MG tablet Commonly known as: COREG  TAKE 1 TABLET(12.5 MG) BY MOUTH TWICE DAILY WITH A MEAL   diphenhydrAMINE 25 MG tablet Commonly known as: BENADRYL Take 25 mg by mouth every 6 (six) hours as needed for itching or allergies.   folic acid  1 MG tablet Commonly known as: FOLVITE  TAKE 1 TABLET BY MOUTH DAILY   gabapentin  600 MG tablet Commonly known as: NEURONTIN  Take 2 caps in AM, 3 caps in PM   nortriptyline  10 MG capsule Commonly known as: PAMELOR  TAKE 1 CAPSULE(10 MG) BY MOUTH AT BEDTIME   potassium chloride  SA 20 MEQ tablet Commonly known as: KLOR-CON  M Take 1 tablet (20 mEq total) by mouth daily.   rivaroxaban  10 MG Tabs tablet Commonly known as: Xarelto  Take 1 tablet (10 mg total) by mouth daily.   thiamine  100 MG tablet Commonly known as: VITAMIN B1 Take 1 tablet (100 mg total) by mouth daily.        Allergies: No Known Allergies  Past Medical History, Surgical history, Social history, and Family History were reviewed and updated.  Review of Systems: Review of  Systems  Constitutional: Negative.   HENT: Negative.    Eyes: Negative.   Respiratory: Negative.    Cardiovascular: Negative.   Gastrointestinal: Negative.   Genitourinary: Negative.   Musculoskeletal:  Positive for myalgias.  Skin: Negative.   Neurological:  Negative for seizures.  Endo/Heme/Allergies: Negative.   Psychiatric/Behavioral: Negative.      Physical Exam:  height is 5' 8 (1.727 m) and weight is 117 lb (53.1 kg). His oral temperature is 98.7 F (37.1 C). His blood pressure is 138/83 and his pulse is 88. His respiration is 18 and oxygen saturation is 100%.   Wt Readings from Last 3 Encounters:  04/16/23 117 lb (53.1 kg)  04/06/23 119 lb (54 kg)  09/02/22 121 lb 6.4 oz (55.1 kg)     Physical Exam Vitals reviewed.  HENT:     Head: Normocephalic and atraumatic.  Eyes:     Pupils: Pupils are equal, round, and reactive to light.  Cardiovascular:     Rate and Rhythm: Normal rate and regular rhythm.     Heart sounds: Normal heart sounds.  Pulmonary:     Effort: Pulmonary effort is normal.     Breath sounds: Normal breath sounds.  Abdominal:     General: Bowel sounds are normal.     Palpations: Abdomen is soft.  Musculoskeletal:        General: No tenderness or deformity. Normal range of motion.     Cervical back: Normal range of motion.  Lymphadenopathy:     Cervical: No cervical adenopathy.  Skin:    General: Skin is warm and dry.     Findings: No erythema or rash.  Neurological:     Mental Status: He is alert and oriented to person, place, and time.  Psychiatric:        Behavior: Behavior normal.        Thought Content: Thought content normal.        Judgment: Judgment normal.     Lab Results  Component Value Date   WBC 8.2 04/06/2023   HGB 10.2 (L) 04/06/2023   HCT 31.7 (L) 04/06/2023   MCV 81.4 04/06/2023   PLT 85.0 (L) 04/06/2023   No results found for: FERRITIN, IRON, TIBC, UIBC, IRONPCTSAT Lab Results  Component Value Date   RBC 3.90 (L) 04/06/2023   No results found for: KPAFRELGTCHN, LAMBDASER, KAPLAMBRATIO No results found for: IGGSERUM, IGA, IGMSERUM No results found for: STEPHANY CARLOTA BENSON MARKEL EARLA JOANNIE DOC VICK, SPEI   Chemistry      Component Value Date/Time   NA 144 04/10/2023 1015   NA 141 07/29/2018 1025   NA 145 01/30/2017 0952   K 3.2 (L) 04/10/2023 1015   K 4.1 01/30/2017 0952   CL 99 04/10/2023 1015   CL 105 01/30/2017 0952   CO2 32 04/10/2023 1015   CO2 28 01/30/2017 0952   BUN 7 04/10/2023 1015   BUN 8 07/29/2018 1025   BUN 11 01/30/2017 0952   CREATININE 0.59 04/10/2023 1015   CREATININE 0.66 01/03/2020 0751      Component Value Date/Time    CALCIUM 8.1 (L) 04/10/2023 1015   CALCIUM 8.7 01/30/2017 0952   ALKPHOS 159 (H) 04/10/2023 1015   ALKPHOS 162 (H) 01/30/2017 0952   AST 162 (H) 04/10/2023 1015   AST 88 (H) 12/23/2018 1100   ALT 42 04/10/2023 1015   ALT 47 (H) 12/23/2018 1100   ALT 92 (H) 01/30/2017 0952   BILITOT 0.9 04/10/2023 1015   BILITOT  0.8 12/23/2018 1100     Encounter Diagnoses  Name Primary?   Anemia, unspecified type Yes   History of DVT (deep vein thrombosis)    Protein C deficiency (HCC)    Lupus anticoagulant disorder (HCC)     Impression and Plan: Mr. Delman is a very pleasant 48 yo African American gentleman with Protein C deficiency and a transiently positive lupus anticoagulant. He has history of a right upper extremity brachial DVT which has since resolved.  He now has new anemia. He used to take B1 and B12 which was recommended by his neurologist. He has been off of all of those for about 5-6 months. He is still taking folic acid .   I suspect that his liver function is impacting his labs. Likely also has nutritional deficiencies secondary to ETOH intake and poor nutritional status. Slow reduction of ETOH recommended. Restarting his supplements also recommended. At this time I would recommend that he continue on his Xarelto .   Labs today RTC 1 month APP, labs (CBC w/, CMP, retic, B12, folate, ferritin, iron, save smear)  Lauraine CHRISTELLA Dais, PA-C [phone] AM

## 2023-04-16 NOTE — Progress Notes (Signed)
 This nurse got in touch with Samuel Cantrell (per [facility]) to get patient started on Xarelto  patient assistance. I told her to send the information to the [employer] nurses per secure chat. Patient is aware that someone will be calling him or using his MyChart for messages. He is fine with either one.

## 2023-04-17 ENCOUNTER — Encounter: Payer: Self-pay | Admitting: Gastroenterology

## 2023-04-20 ENCOUNTER — Other Ambulatory Visit: Payer: Self-pay | Admitting: Emergency Medicine

## 2023-04-20 ENCOUNTER — Other Ambulatory Visit: Payer: Self-pay

## 2023-04-20 DIAGNOSIS — Z86718 Personal history of other venous thrombosis and embolism: Secondary | ICD-10-CM

## 2023-04-20 MED ORDER — CARVEDILOL 12.5 MG PO TABS: ORAL_TABLET | ORAL | 1 refills | Status: DC

## 2023-04-20 MED ORDER — RIVAROXABAN 10 MG PO TABS: 10.0000 mg | ORAL_TABLET | Freq: Every day | ORAL | 3 refills | Status: DC

## 2023-04-20 NOTE — Telephone Encounter (Signed)
 Copied from CRM [id number]. Topic: Clinical - Medication Refill >> Apr 20, 2023  1:02 PM Aline Ireland wrote: Most Recent Primary Care Visit:  Provider: [facility]  Department[facility]  Visit Type: LAB VISIT  Date: 04/10/2023  Medication: carvedilol  (COREG ) 12.5 MG tablet  Has the patient contacted their pharmacy? No  (Agent: If no, request that the patient contact the pharmacy for the refill. If patient does not wish to contact the pharmacy document the reason why and proceed with request.) (Agent: If yes, when and what did the pharmacy advise?) Pt stated that he spoke with his PCP regarding this refill at a previous appointment.   Is this the correct pharmacy for this prescription? Yes If no, delete pharmacy and type the correct one.  This is the patient's preferred pharmacy:  [id number] - [place], [place] - [address] AT [facility] [address]  [place] [address] Phone: [phone] Fax: [phone]   Has the prescription been filled recently? No  Is the patient out of the medication? No  Has the patient been seen for an appointment in the last year OR does the patient have an upcoming appointment?   Can we respond through MyChart?   Agent: Please be advised that Rx refills may take up to 3 business days. We ask that you follow-up with your pharmacy.

## 2023-04-23 ENCOUNTER — Telehealth: Payer: Self-pay | Admitting: Medical

## 2023-04-23 NOTE — Telephone Encounter (Signed)
Copied from CRM [id number]. Topic: General - Billing Inquiry >> Apr 23, 2023 11:54 AM Isabell A wrote: Reason for CRM: Patient states he paid out of pocket for his last office visit because he forgot his insurance card, he is requesting a refund - he has found his insurance card.

## 2023-04-26 ENCOUNTER — Ambulatory Visit (HOSPITAL_BASED_OUTPATIENT_CLINIC_OR_DEPARTMENT_OTHER)
Admission: RE | Admit: 2023-04-26 | Discharge: 2023-04-26 | Disposition: A | Discharge status: Home / Self Care | Payer: 59 | Source: Ambulatory Visit | Attending: Medical | Admitting: Medical

## 2023-04-26 DIAGNOSIS — K769 Liver disease, unspecified: Secondary | ICD-10-CM | POA: Insufficient documentation

## 2023-04-26 MED ORDER — GADOBUTROL 1 MMOL/ML IV SOLN
5.0000 mL | Freq: Once | INTRAVENOUS | Status: AC | PRN
  Administered 2023-04-26: 5 mL via INTRAVENOUS

## 2023-04-29 ENCOUNTER — Encounter: Payer: Self-pay | Admitting: Medical

## 2023-04-29 NOTE — Addendum Note (Signed)
 Addended by: Gwenevere Abbot on: 04/29/2023 12:12 PM   Modules accepted: Orders

## 2023-05-05 NOTE — Telephone Encounter (Signed)
 LMOVM making pt aware that we have file claim to his insurance and if there wasn't an outstanding balance with [facility] that needed to be paid he could receive a refund that we had to wait to see what was covered by insurance.

## 2023-05-14 ENCOUNTER — Inpatient Hospital Stay: Payer: 59

## 2023-05-14 ENCOUNTER — Inpatient Hospital Stay: Payer: Self-pay | Admitting: Medical Oncology

## 2023-05-21 ENCOUNTER — Encounter: Payer: Self-pay | Admitting: Medical Oncology

## 2023-05-21 ENCOUNTER — Inpatient Hospital Stay: Payer: Self-pay | Attending: Medical Oncology

## 2023-05-21 ENCOUNTER — Telehealth: Payer: Self-pay

## 2023-05-21 ENCOUNTER — Inpatient Hospital Stay (HOSPITAL_BASED_OUTPATIENT_CLINIC_OR_DEPARTMENT_OTHER): Admitting: Medical Oncology

## 2023-05-21 ENCOUNTER — Telehealth: Payer: Self-pay | Admitting: *Deleted

## 2023-05-21 VITALS — BP 98/67 | HR 101 | Temp 98.5°F | Resp 18 | Ht 68.0 in | Wt 115.0 lb

## 2023-05-21 DIAGNOSIS — D649 Anemia, unspecified: Secondary | ICD-10-CM | POA: Diagnosis not present

## 2023-05-21 DIAGNOSIS — D6862 Lupus anticoagulant syndrome: Secondary | ICD-10-CM

## 2023-05-21 DIAGNOSIS — Z7901 Long term (current) use of anticoagulants: Secondary | ICD-10-CM | POA: Diagnosis not present

## 2023-05-21 DIAGNOSIS — D696 Thrombocytopenia, unspecified: Secondary | ICD-10-CM

## 2023-05-21 DIAGNOSIS — Z86718 Personal history of other venous thrombosis and embolism: Secondary | ICD-10-CM

## 2023-05-21 DIAGNOSIS — E639 Nutritional deficiency, unspecified: Secondary | ICD-10-CM

## 2023-05-21 DIAGNOSIS — K769 Liver disease, unspecified: Secondary | ICD-10-CM | POA: Diagnosis not present

## 2023-05-21 DIAGNOSIS — D6859 Other primary thrombophilia: Secondary | ICD-10-CM

## 2023-05-21 DIAGNOSIS — F17210 Nicotine dependence, cigarettes, uncomplicated: Secondary | ICD-10-CM | POA: Insufficient documentation

## 2023-05-21 DIAGNOSIS — F1090 Alcohol use, unspecified, uncomplicated: Secondary | ICD-10-CM | POA: Diagnosis not present

## 2023-05-21 DIAGNOSIS — Z789 Other specified health status: Secondary | ICD-10-CM

## 2023-05-21 LAB — CBC WITH DIFFERENTIAL (CANCER CENTER ONLY)
Abs Immature Granulocytes: 0.03 10*3/uL (ref 0.00–0.07)
Basophils Absolute: 0.1 10*3/uL (ref 0.0–0.1)
Basophils Relative: 1 %
Eosinophils Absolute: 0.2 10*3/uL (ref 0.0–0.5)
Eosinophils Relative: 2 %
HCT: 32.9 % — ABNORMAL LOW (ref 39.0–52.0)
Hemoglobin: 11.1 g/dL — ABNORMAL LOW (ref 13.0–17.0)
Immature Granulocytes: 0 %
Lymphocytes Relative: 25 %
Lymphs Abs: 1.8 10*3/uL (ref 0.7–4.0)
MCH: 25.6 pg — ABNORMAL LOW (ref 26.0–34.0)
MCHC: 33.7 g/dL (ref 30.0–36.0)
MCV: 75.8 fL — ABNORMAL LOW (ref 80.0–100.0)
Monocytes Absolute: 0.8 10*3/uL (ref 0.1–1.0)
Monocytes Relative: 11 %
Neutro Abs: 4.4 10*3/uL (ref 1.7–7.7)
Neutrophils Relative %: 61 %
Platelet Count: 78 10*3/uL — ABNORMAL LOW (ref 150–400)
RBC: 4.34 MIL/uL (ref 4.22–5.81)
RDW: 19.4 % — ABNORMAL HIGH (ref 11.5–15.5)
WBC Count: 7.3 10*3/uL (ref 4.0–10.5)
nRBC: 0.3 % — ABNORMAL HIGH (ref 0.0–0.2)

## 2023-05-21 LAB — RETIC PANEL
Immature Retic Fract: 16.8 % — ABNORMAL HIGH (ref 2.3–15.9)
RBC.: 4.34 MIL/uL (ref 4.22–5.81)
Retic Count, Absolute: 28.2 10*3/uL (ref 19.0–186.0)
Retic Ct Pct: 0.7 % (ref 0.4–3.1)
Reticulocyte Hemoglobin: 31.3 pg (ref >27.9)

## 2023-05-21 LAB — CMP (CANCER CENTER ONLY)
ALT: 41 U/L (ref 0–44)
AST: 172 U/L (ref 15–41)
Albumin: 4 g/dL (ref 3.5–5.0)
Alkaline Phosphatase: 143 U/L — ABNORMAL HIGH (ref 38–126)
Anion gap: 15 (ref 5–15)
BUN: 7 mg/dL (ref 6–20)
CO2: 30 mmol/L (ref 22–32)
Calcium: 8.8 mg/dL — ABNORMAL LOW (ref 8.9–10.3)
Chloride: 95 mmol/L — ABNORMAL LOW (ref 98–111)
Creatinine: 0.68 mg/dL (ref 0.61–1.24)
GFR, Estimated: >60 mL/min (ref >60)
Glucose, Bld: 160 mg/dL — ABNORMAL HIGH (ref 70–99)
Potassium: 3.1 mmol/L — ABNORMAL LOW (ref 3.5–5.1)
Sodium: 140 mmol/L (ref 135–145)
Total Bilirubin: 1 mg/dL (ref 0.0–1.2)
Total Protein: 8.9 g/dL — ABNORMAL HIGH (ref 6.5–8.1)

## 2023-05-21 LAB — IRON AND IRON BINDING CAPACITY (CC-WL,HP ONLY)
Iron: 108 ug/dL (ref 45–182)
Saturation Ratios: 20 % (ref 17.9–39.5)
TIBC: 547 ug/dL — ABNORMAL HIGH (ref 250–450)
UIBC: 439 ug/dL — ABNORMAL HIGH (ref 117–376)

## 2023-05-21 LAB — FOLATE: Folate: 5.7 ng/mL — ABNORMAL LOW (ref >5.9)

## 2023-05-21 LAB — VITAMIN B12: Vitamin B-12: 3386 pg/mL — ABNORMAL HIGH (ref 180–914)

## 2023-05-21 LAB — SAVE SMEAR(SSMR), FOR PROVIDER SLIDE REVIEW

## 2023-05-21 LAB — FERRITIN: Ferritin: 51 ng/mL (ref 24–336)

## 2023-05-21 NOTE — Patient Instructions (Addendum)
 Please start a prenatal vitamin. Take as directed on the bottle Please also continue to reduce your alcohol intake Please start your Xarelto

## 2023-05-21 NOTE — Progress Notes (Signed)
 Hematology and Oncology Follow Up Visit  Samuel Cantrell [id number] 1976/01/31 48 y.o. 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    Interim History: Samuel Cantrell is here today for follow up:   To recap: We previously followed him for his history of Protein C deficiency, positive lupus anticoagulant and history of DVT for which he takes Xarelto 10 mg daily. Of note he did have a repeat US of his arm which did not show any residual thrombus in the left brachial vein.He was recently found to be anemic and was referred back to our office. He has never had a colonoscopy. He does have pending Hemoccult card testing which was ordered by his PCP.  Of note he had negative Hepatitis B/C screenings in 2020. No new partners or blood exposures since this time. HIV screening 8 years ago was negative. It was suspected that nutritional deficiency and liver disease was the cause of his CBC abnormalities. Slow and steady reduction of ETOH was recommended along with starting a prenatal vitamin.   Since our last visit he reports doing ok. He has not started the prenatal vitamin due to forgetting or the xarelto due to cost. He has been working with our office to get him set up with [employer] patient assistance service. He reports that he has not made any of these calls. Number given again to patient today.   He had a UC of his liver on 04/26/2023 which showed hepatomegaly and fatty infiltrate.   There has been no bleeding to his knowledge: denies epistaxis, gingivitis, hemoptysis, hematemesis, hematuria, melena, excessive bruising, blood donation.   He drinks ETOH. Was drinking 2-3 shots of liquor per day at its most and 1 shot per day on the low end. Today  he reports 2 shots per day of liquor.   He says he smokes about 4-5 cigarettes a day.  Diet is described as poor with a large amount of high fat and fried foods.   There is been no seizures.  He has had no syncopal episodes.  He has had no nausea or vomiting.  No chest pain, SOB, peripheral edema, calf pain  Appetite is fair. Weight continues to trend down slowly. He denies abdominal pains, new GERD.   Overall, I would have to say that his performance status is ECOG 1. Wt Readings from Last 3 Encounters:  05/21/23 115 lb (52.2 kg)  04/16/23 117 lb (53.1 kg)  04/06/23 119 lb (54 kg)   Medications:  Allergies as of 05/21/2023   No Known Allergies      Medication List        Accurate as of May 21, 2023  2:16 PM. If you have any questions, ask your nurse or doctor.          carvedilol 12.5 MG tablet Commonly known as: COREG TAKE 1 TABLET(12.5 MG) BY MOUTH TWICE DAILY WITH A MEAL   diphenhydrAMINE 25 MG tablet Commonly known as: BENADRYL Take 25 mg by mouth every 6 (six) hours as needed for itching or allergies.   folic acid 1 MG tablet Commonly known as: FOLVITE TAKE 1 TABLET BY MOUTH DAILY   gabapentin 600 MG tablet Commonly known as: NEURONTIN Take 2 caps in AM, 3 caps in PM  nortriptyline 10 MG capsule Commonly known as: PAMELOR TAKE 1 CAPSULE(10 MG) BY MOUTH AT BEDTIME   potassium chloride SA 20 MEQ tablet Commonly known as: KLOR-CON M Take 1 tablet (20 mEq total) by mouth daily.   rivaroxaban 10 MG Tabs tablet Commonly known as: Xarelto Take 1 tablet (10 mg total) by mouth daily.   thiamine 100 MG tablet Commonly known as: VITAMIN B1 Take 1 tablet (100 mg total) by mouth daily.        Allergies: No Known Allergies  Past Medical History, Surgical history, Social history, and Family History were reviewed and updated.  Review of Systems: Review of Systems  Constitutional: Negative.   HENT: Negative.    Eyes: Negative.   Respiratory:  Negative.    Cardiovascular: Negative.   Gastrointestinal: Negative.   Genitourinary: Negative.   Musculoskeletal:  Positive for myalgias.  Skin: Negative.   Neurological:  Negative for seizures.  Endo/Heme/Allergies: Negative.   Psychiatric/Behavioral: Negative.      Physical Exam:  height is 5\' 8"  (1.727 m) and weight is 115 lb (52.2 kg). His oral temperature is 98.5 F (36.9 C). His blood pressure is 98/67 and his pulse is 101 (abnormal). His respiration is 18 and oxygen saturation is 99%.   Wt Readings from Last 3 Encounters:  05/21/23 115 lb (52.2 kg)  04/16/23 117 lb (53.1 kg)  04/06/23 119 lb (54 kg)    Physical Exam Vitals reviewed.  HENT:     Head: Normocephalic and atraumatic.  Eyes:     Pupils: Pupils are equal, round, and reactive to light.  Cardiovascular:     Rate and Rhythm: Normal rate and regular rhythm.     Heart sounds: Normal heart sounds.  Pulmonary:     Effort: Pulmonary effort is normal.     Breath sounds: Normal breath sounds.  Abdominal:     General: Bowel sounds are normal.     Palpations: Abdomen is soft.  Musculoskeletal:        General: No tenderness or deformity. Normal range of motion.     Cervical back: Normal range of motion.  Lymphadenopathy:     Cervical: No cervical adenopathy.  Skin:    General: Skin is warm and dry.     Findings: No erythema or rash.  Neurological:     Mental Status: He is alert and oriented to person, place, and time.  Psychiatric:        Behavior: Behavior normal.        Thought Content: Thought content normal.        Judgment: Judgment normal.     Lab Results  Component Value Date   WBC 7.3 05/21/2023   HGB 11.1 (L) 05/21/2023   HCT 32.9 (L) 05/21/2023   MCV 75.8 (L) 05/21/2023   PLT 78 (L) 05/21/2023   Lab Results  Component Value Date   FERRITIN 36 04/16/2023   IRON 108 05/21/2023   TIBC 547 (H) 05/21/2023   UIBC 439 (H) 05/21/2023   IRONPCTSAT 20 05/21/2023   Lab Results  Component Value  Date   RETICCTPCT 0.7 05/21/2023   RBC 4.34 05/21/2023   RBC 4.34 05/21/2023   No results found for: "KPAFRELGTCHN", "LAMBDASER", "KAPLAMBRATIO" No results found for: "IGGSERUM", "IGA", "IGMSERUM" No results found for: "TOTALPROTELP", "ALBUMINELP", "A1GS", "A2GS", "BETS", "BETA2SER", "GAMS", "MSPIKE", "SPEI"   Chemistry      Component Value Date/Time   NA 140 05/21/2023 1028   NA 141 07/29/2018 1025   NA 145 01/30/2017 1610  K 3.1 (L) 05/21/2023 1028   K 4.1 01/30/2017 0952   CL 95 (L) 05/21/2023 1028   CL 105 01/30/2017 0952   CO2 30 05/21/2023 1028   CO2 28 01/30/2017 0952   BUN 7 05/21/2023 1028   BUN 8 07/29/2018 1025   BUN 11 01/30/2017 0952   CREATININE 0.68 05/21/2023 1028   CREATININE 0.66 01/03/2020 0751      Component Value Date/Time   CALCIUM 8.8 (L) 05/21/2023 1028   CALCIUM 8.7 01/30/2017 0952   ALKPHOS 143 (H) 05/21/2023 1028   ALKPHOS 162 (H) 01/30/2017 0952   AST 172 (HH) 05/21/2023 1028   ALT 41 05/21/2023 1028   ALT 92 (H) 01/30/2017 0952   BILITOT 1.0 05/21/2023 1028     Encounter Diagnoses  Name Primary?   Liver disease Yes   Thrombocytopenia (HCC)    Anemia, unspecified type    History of DVT (deep vein thrombosis)    Protein C deficiency (HCC)    Lupus anticoagulant disorder (HCC)    Alcohol use    Nutritional deficiency    Impression and Plan: Mr. Esterly is a very pleasant 48 yo African American gentleman with a complicated medical history including Protein C deficiency and a transiently positive lupus anticoagulant. He has history of a right upper extremity brachial DVT which has since resolved.  He now has new anemia which appears multifactorial- nutritional, secondary to organ dysfunction. He also has been referred to GI by his PCP for colon cancer screening/ anemia/hx ETOH use.   Today we again discussed the benefits of medical compliance and I have encouraged him to ensure medical follow up and follow through on items that are  suggested.   We will have him start a prenatal vitamin We have referred him to a hepatologist. I have encouraged him to schedule his GI consult I have discussed a low fat diet and the importance of weaning off of ETOH and avoiding tylenol products.  Red flag sign and symptoms discussed   RTC 1 month APP, labs (CBC w/, CMP, retic, B12, folate, ferritin, iron, save smear)  Rushie Chestnut, PA-C 3/13/20252:16 PM

## 2023-05-21 NOTE — Telephone Encounter (Signed)
 Fortino Sic PA notified of [id number].  No orders received at this time.

## 2023-05-21 NOTE — Telephone Encounter (Signed)
 Clinical Social Work was referred by Jaquita Rector for financial assistance.  CSW attempted to contact patient by phone.  Left voicemail with contact information and request for return call.

## 2023-05-25 ENCOUNTER — Inpatient Hospital Stay: Payer: Self-pay

## 2023-05-25 ENCOUNTER — Other Ambulatory Visit: Payer: Self-pay | Admitting: Medical Oncology

## 2023-05-25 DIAGNOSIS — E639 Nutritional deficiency, unspecified: Secondary | ICD-10-CM

## 2023-05-25 DIAGNOSIS — D649 Anemia, unspecified: Secondary | ICD-10-CM

## 2023-05-25 NOTE — Progress Notes (Signed)
 [facility] CSW Progress Note  Clinical Child psychotherapist contacted patient by phone to assess needs  He requested assistance with Xarelto co-pay.  After reviewing patient's chart, it was determined Ezra Sites had contacted him and provided the information he needed.  CSW provided him with the information again.  He needed to contact Xarelto directly at [phone].  He stated he understood.    Dorothey Baseman, LCSW Clinical Social Worker [facility]

## 2023-06-01 ENCOUNTER — Ambulatory Visit: Payer: Self-pay | Admitting: Gastroenterology

## 2023-06-01 NOTE — Progress Notes (Deleted)
 [name] Kitchen

## 2023-06-17 ENCOUNTER — Ambulatory Visit: Payer: Self-pay

## 2023-06-17 NOTE — Telephone Encounter (Signed)
  Chief Complaint: dizzy, low bp Symptoms: dizzy Frequency: intermittent X several weeks Pertinent Negatives: Patient denies chest pain Disposition: [] ED /[] Urgent Care (no appt availability in office) / [x] Appointment(In office/virtual)/ []  [facility]/ [] Home Care/ [] Refused Recommended Disposition /[] [facility]/ []  Follow-up with PCP Additional Notes:  Two days this week experienced dizziness. He is on blood pressure medication, but has not taken in a couple days because it has been dropping. He states he checks his blood pressure before dosing and if pressure is low he skips his medication for the day. He noted dizziness today and checked his blood pressure which was low for him at 116/68. He notices dizziness when having bowel movement. He did pass out two weeks ago, he mentioned this to he hematologist who advised he not strain during BM. Noted heart rate in 120's yesterday, currently hr is 90. Reports he always has an irregular heart rate. Next available acute visit scheduled with PCP on 06/18/23. Educated on care advice as documented in protocol, patient verbalized understanding. Discussed reasons to call back or call for EMS.    Message from [place] J sent at 06/17/2023  3:07 PM EDT  Summary: Low BP / Dizziness   Copied From CRM #[id number]. Reason for Triage: Blood pressure continues to drop throughout day which is making patient experience dizziness. 116/68      Reason for Disposition  Taking a medicine that could cause dizziness (e.g., blood pressure medications, diuretics)  Protocols used: Dizziness - Lightheadedness-A-AH

## 2023-06-18 ENCOUNTER — Ambulatory Visit: Payer: Self-pay | Admitting: Medical

## 2023-06-22 ENCOUNTER — Inpatient Hospital Stay: Payer: Self-pay | Admitting: Medical Oncology

## 2023-06-22 ENCOUNTER — Inpatient Hospital Stay: Payer: Self-pay | Attending: Medical Oncology

## 2023-07-11 ENCOUNTER — Inpatient Hospital Stay (HOSPITAL_BASED_OUTPATIENT_CLINIC_OR_DEPARTMENT_OTHER)
Admission: EM | Admit: 2023-07-11 | Discharge: 2023-07-15 | DRG: 433 | Disposition: A | Discharge status: Home Health | Payer: Self-pay | Attending: Internal Medicine | Admitting: Internal Medicine

## 2023-07-11 ENCOUNTER — Other Ambulatory Visit: Payer: Self-pay

## 2023-07-11 ENCOUNTER — Emergency Department (HOSPITAL_BASED_OUTPATIENT_CLINIC_OR_DEPARTMENT_OTHER): Payer: Self-pay

## 2023-07-11 ENCOUNTER — Encounter (HOSPITAL_BASED_OUTPATIENT_CLINIC_OR_DEPARTMENT_OTHER): Payer: Self-pay | Admitting: Emergency Medicine

## 2023-07-11 DIAGNOSIS — R760 Raised antibody titer: Secondary | ICD-10-CM | POA: Insufficient documentation

## 2023-07-11 DIAGNOSIS — D649 Anemia, unspecified: Secondary | ICD-10-CM | POA: Insufficient documentation

## 2023-07-11 DIAGNOSIS — E876 Hypokalemia: Secondary | ICD-10-CM

## 2023-07-11 DIAGNOSIS — D61818 Other pancytopenia: Secondary | ICD-10-CM

## 2023-07-11 DIAGNOSIS — F10239 Alcohol dependence with withdrawal, unspecified: Secondary | ICD-10-CM | POA: Diagnosis present

## 2023-07-11 DIAGNOSIS — K5900 Constipation, unspecified: Secondary | ICD-10-CM | POA: Diagnosis present

## 2023-07-11 DIAGNOSIS — K7031 Alcoholic cirrhosis of liver with ascites: Principal | ICD-10-CM

## 2023-07-11 DIAGNOSIS — G8929 Other chronic pain: Secondary | ICD-10-CM | POA: Diagnosis present

## 2023-07-11 DIAGNOSIS — I2510 Atherosclerotic heart disease of native coronary artery without angina pectoris: Secondary | ICD-10-CM | POA: Diagnosis present

## 2023-07-11 DIAGNOSIS — E872 Acidosis, unspecified: Secondary | ICD-10-CM | POA: Diagnosis present

## 2023-07-11 DIAGNOSIS — K828 Other specified diseases of gallbladder: Secondary | ICD-10-CM | POA: Diagnosis present

## 2023-07-11 DIAGNOSIS — Z720 Tobacco use: Secondary | ICD-10-CM | POA: Diagnosis present

## 2023-07-11 DIAGNOSIS — K7682 Hepatic encephalopathy: Secondary | ICD-10-CM | POA: Diagnosis present

## 2023-07-11 DIAGNOSIS — R6510 Systemic inflammatory response syndrome (SIRS) of non-infectious origin without acute organ dysfunction: Secondary | ICD-10-CM

## 2023-07-11 DIAGNOSIS — K219 Gastro-esophageal reflux disease without esophagitis: Secondary | ICD-10-CM | POA: Diagnosis present

## 2023-07-11 DIAGNOSIS — I252 Old myocardial infarction: Secondary | ICD-10-CM

## 2023-07-11 DIAGNOSIS — R109 Unspecified abdominal pain: Secondary | ICD-10-CM | POA: Diagnosis present

## 2023-07-11 DIAGNOSIS — Z7409 Other reduced mobility: Secondary | ICD-10-CM | POA: Diagnosis present

## 2023-07-11 DIAGNOSIS — R7989 Other specified abnormal findings of blood chemistry: Secondary | ICD-10-CM

## 2023-07-11 DIAGNOSIS — K7460 Unspecified cirrhosis of liver: Secondary | ICD-10-CM | POA: Diagnosis present

## 2023-07-11 DIAGNOSIS — F17210 Nicotine dependence, cigarettes, uncomplicated: Secondary | ICD-10-CM | POA: Diagnosis present

## 2023-07-11 DIAGNOSIS — K652 Spontaneous bacterial peritonitis: Principal | ICD-10-CM

## 2023-07-11 DIAGNOSIS — I10 Essential (primary) hypertension: Secondary | ICD-10-CM | POA: Diagnosis present

## 2023-07-11 DIAGNOSIS — R636 Underweight: Secondary | ICD-10-CM | POA: Diagnosis present

## 2023-07-11 DIAGNOSIS — Z681 Body mass index (BMI) 19 or less, adult: Secondary | ICD-10-CM | POA: Diagnosis not present

## 2023-07-11 DIAGNOSIS — Z79899 Other long term (current) drug therapy: Secondary | ICD-10-CM

## 2023-07-11 DIAGNOSIS — K8020 Calculus of gallbladder without cholecystitis without obstruction: Secondary | ICD-10-CM | POA: Diagnosis present

## 2023-07-11 DIAGNOSIS — G629 Polyneuropathy, unspecified: Secondary | ICD-10-CM

## 2023-07-11 DIAGNOSIS — Z86718 Personal history of other venous thrombosis and embolism: Secondary | ICD-10-CM

## 2023-07-11 DIAGNOSIS — F10939 Alcohol use, unspecified with withdrawal, unspecified: Secondary | ICD-10-CM

## 2023-07-11 DIAGNOSIS — A419 Sepsis, unspecified organism: Secondary | ICD-10-CM

## 2023-07-11 DIAGNOSIS — Z8249 Family history of ischemic heart disease and other diseases of the circulatory system: Secondary | ICD-10-CM

## 2023-07-11 DIAGNOSIS — D696 Thrombocytopenia, unspecified: Secondary | ICD-10-CM | POA: Diagnosis present

## 2023-07-11 DIAGNOSIS — E8809 Other disorders of plasma-protein metabolism, not elsewhere classified: Secondary | ICD-10-CM | POA: Diagnosis present

## 2023-07-11 DIAGNOSIS — D6862 Lupus anticoagulant syndrome: Secondary | ICD-10-CM | POA: Diagnosis present

## 2023-07-11 DIAGNOSIS — D6859 Other primary thrombophilia: Secondary | ICD-10-CM | POA: Insufficient documentation

## 2023-07-11 DIAGNOSIS — K766 Portal hypertension: Secondary | ICD-10-CM | POA: Diagnosis present

## 2023-07-11 DIAGNOSIS — Z7901 Long term (current) use of anticoagulants: Secondary | ICD-10-CM

## 2023-07-11 DIAGNOSIS — F17200 Nicotine dependence, unspecified, uncomplicated: Secondary | ICD-10-CM | POA: Diagnosis present

## 2023-07-11 DIAGNOSIS — R188 Other ascites: Secondary | ICD-10-CM | POA: Diagnosis present

## 2023-07-11 DIAGNOSIS — Z7982 Long term (current) use of aspirin: Secondary | ICD-10-CM

## 2023-07-11 DIAGNOSIS — Z5989 Other problems related to housing and economic circumstances: Secondary | ICD-10-CM

## 2023-07-11 HISTORY — DX: Unspecified cirrhosis of liver: K74.60

## 2023-07-11 HISTORY — DX: Alcohol abuse, uncomplicated: F10.10

## 2023-07-11 HISTORY — DX: Anemia, unspecified: D64.9

## 2023-07-11 LAB — URINALYSIS, MICROSCOPIC (REFLEX)

## 2023-07-11 LAB — URINALYSIS, ROUTINE W REFLEX MICROSCOPIC
Glucose, UA: NEGATIVE mg/dL
Hgb urine dipstick: NEGATIVE
Ketones, ur: 15 mg/dL — AB
Leukocytes,Ua: NEGATIVE
Nitrite: NEGATIVE
Protein, ur: 30 mg/dL — AB
Specific Gravity, Urine: 1.025 (ref 1.005–1.030)
pH: 5 (ref 5.0–8.0)

## 2023-07-11 LAB — BODY FLUID CELL COUNT WITH DIFFERENTIAL
Eos, Fluid: 0 %
Lymphs, Fluid: 69 %
Monocyte-Macrophage-Serous Fluid: 14 % — ABNORMAL LOW (ref 50–90)
Neutrophil Count, Fluid: 17 % (ref 0–25)
Total Nucleated Cell Count, Fluid: 73 uL (ref 0–1000)

## 2023-07-11 LAB — LIPASE, BLOOD: Lipase: 64 U/L — ABNORMAL HIGH (ref 11–51)

## 2023-07-11 LAB — CBC WITH DIFFERENTIAL/PLATELET
Abs Immature Granulocytes: 0.06 10*3/uL (ref 0.00–0.07)
Basophils Absolute: 0.1 10*3/uL (ref 0.0–0.1)
Basophils Relative: 1 %
Eosinophils Absolute: 0.1 10*3/uL (ref 0.0–0.5)
Eosinophils Relative: 1 %
HCT: 25.3 % — ABNORMAL LOW (ref 39.0–52.0)
Hemoglobin: 8.8 g/dL — ABNORMAL LOW (ref 13.0–17.0)
Immature Granulocytes: 1 %
Lymphocytes Relative: 27 %
Lymphs Abs: 3.3 10*3/uL (ref 0.7–4.0)
MCH: 29.9 pg (ref 26.0–34.0)
MCHC: 34.8 g/dL (ref 30.0–36.0)
MCV: 86.1 fL (ref 80.0–100.0)
Monocytes Absolute: 1.2 10*3/uL — ABNORMAL HIGH (ref 0.1–1.0)
Monocytes Relative: 10 %
Neutro Abs: 7.4 10*3/uL (ref 1.7–7.7)
Neutrophils Relative %: 60 %
Platelets: 92 10*3/uL — ABNORMAL LOW (ref 150–400)
RBC: 2.94 MIL/uL — ABNORMAL LOW (ref 4.22–5.81)
RDW: 33.5 % — ABNORMAL HIGH (ref 11.5–15.5)
Smear Review: UNDETERMINED
WBC: 12.2 10*3/uL — ABNORMAL HIGH (ref 4.0–10.5)
nRBC: 0.5 % — ABNORMAL HIGH (ref 0.0–0.2)

## 2023-07-11 LAB — COMPREHENSIVE METABOLIC PANEL WITH GFR
ALT: 70 U/L — ABNORMAL HIGH (ref 0–44)
AST: 287 U/L — ABNORMAL HIGH (ref 15–41)
Albumin: 2.7 g/dL — ABNORMAL LOW (ref 3.5–5.0)
Alkaline Phosphatase: 212 U/L — ABNORMAL HIGH (ref 38–126)
Anion gap: 22 — ABNORMAL HIGH (ref 5–15)
BUN: <5 mg/dL — ABNORMAL LOW (ref 6–20)
CO2: 20 mmol/L — ABNORMAL LOW (ref 22–32)
Calcium: 8 mg/dL — ABNORMAL LOW (ref 8.9–10.3)
Chloride: 96 mmol/L — ABNORMAL LOW (ref 98–111)
Creatinine, Ser: 0.62 mg/dL (ref 0.61–1.24)
GFR, Estimated: >60 mL/min (ref >60)
Glucose, Bld: 108 mg/dL — ABNORMAL HIGH (ref 70–99)
Potassium: 3.1 mmol/L — ABNORMAL LOW (ref 3.5–5.1)
Sodium: 138 mmol/L (ref 135–145)
Total Bilirubin: 2.6 mg/dL — ABNORMAL HIGH (ref 0.0–1.2)
Total Protein: 7.8 g/dL (ref 6.5–8.1)

## 2023-07-11 LAB — PROTIME-INR
INR: 1.7 — ABNORMAL HIGH (ref 0.8–1.2)
Prothrombin Time: 20.1 s — ABNORMAL HIGH (ref 11.4–15.2)

## 2023-07-11 LAB — LACTIC ACID, PLASMA
Lactic Acid, Venous: 5.2 mmol/L (ref 0.5–1.9)
Lactic Acid, Venous: 6.5 mmol/L (ref 0.5–1.9)
Lactic Acid, Venous: 5.6 mmol/L (ref 0.5–1.9)
Lactic Acid, Venous: 4.3 mmol/L (ref 0.5–1.9)

## 2023-07-11 LAB — PROTEIN, PLEURAL OR PERITONEAL FLUID: Total protein, fluid: <3 g/dL

## 2023-07-11 LAB — TROPONIN T, HIGH SENSITIVITY
Troponin T High Sensitivity: <15 ng/L (ref <19)
Troponin T High Sensitivity: <15 ng/L (ref <19)

## 2023-07-11 LAB — ALBUMIN, PLEURAL OR PERITONEAL FLUID: Albumin, Fluid: <1.5 g/dL

## 2023-07-11 LAB — LACTATE DEHYDROGENASE, PLEURAL OR PERITONEAL FLUID: LD, Fluid: 64 U/L — ABNORMAL HIGH (ref 3–23)

## 2023-07-11 LAB — GLUCOSE, PLEURAL OR PERITONEAL FLUID: Glucose, Fluid: 108 mg/dL

## 2023-07-11 LAB — PHOSPHORUS: Phosphorus: 2.1 mg/dL — ABNORMAL LOW (ref 2.5–4.6)

## 2023-07-11 LAB — HEMOGLOBIN AND HEMATOCRIT, BLOOD
HCT: 22.2 % — ABNORMAL LOW (ref 39.0–52.0)
Hemoglobin: 7.2 g/dL — ABNORMAL LOW (ref 13.0–17.0)

## 2023-07-11 LAB — MRSA NEXT GEN BY PCR, NASAL: MRSA by PCR Next Gen: NOT DETECTED

## 2023-07-11 LAB — MAGNESIUM: Magnesium: 1.5 mg/dL — ABNORMAL LOW (ref 1.7–2.4)

## 2023-07-11 MED ORDER — ALBUTEROL SULFATE (2.5 MG/3ML) 0.083% IN NEBU: 2.5000 mg | INHALATION_SOLUTION | Freq: Four times a day (QID) | RESPIRATORY_TRACT | Status: DC | PRN

## 2023-07-11 MED ORDER — FOLIC ACID 1 MG PO TABS
1.0000 mg | ORAL_TABLET | Freq: Every day | ORAL | Status: DC
  Administered 2023-07-11 – 2023-07-15 (×5): 1 mg via ORAL
  Filled 2023-07-11 (×5): qty 1

## 2023-07-11 MED ORDER — CHLORHEXIDINE GLUCONATE CLOTH 2 % EX PADS
6.0000 | MEDICATED_PAD | Freq: Every day | CUTANEOUS | Status: DC
  Administered 2023-07-11 – 2023-07-15 (×5): 6 via TOPICAL

## 2023-07-11 MED ORDER — LORAZEPAM 1 MG PO TABS: 1.0000 mg | ORAL_TABLET | ORAL | Status: AC | PRN

## 2023-07-11 MED ORDER — SODIUM CHLORIDE 0.9 % IV SOLN
2.0000 g | Freq: Once | INTRAVENOUS | Status: AC
  Administered 2023-07-11: 2 g via INTRAVENOUS
  Filled 2023-07-11: qty 12.5

## 2023-07-11 MED ORDER — METRONIDAZOLE 500 MG/100ML IV SOLN
500.0000 mg | Freq: Two times a day (BID) | INTRAVENOUS | Status: DC
  Administered 2023-07-11 – 2023-07-15 (×8): 500 mg via INTRAVENOUS
  Filled 2023-07-11 (×8): qty 100

## 2023-07-11 MED ORDER — METRONIDAZOLE 500 MG/100ML IV SOLN
500.0000 mg | Freq: Once | INTRAVENOUS | Status: AC
  Administered 2023-07-11: 500 mg via INTRAVENOUS
  Filled 2023-07-11: qty 100

## 2023-07-11 MED ORDER — ADULT MULTIVITAMIN W/MINERALS CH
1.0000 | ORAL_TABLET | Freq: Every day | ORAL | Status: DC
  Administered 2023-07-11 – 2023-07-15 (×5): 1 via ORAL
  Filled 2023-07-11 (×5): qty 1

## 2023-07-11 MED ORDER — NICOTINE 14 MG/24HR TD PT24
14.0000 mg | MEDICATED_PATCH | Freq: Every day | TRANSDERMAL | Status: DC
  Administered 2023-07-11 – 2023-07-15 (×5): 14 mg via TRANSDERMAL
  Filled 2023-07-11 (×5): qty 1

## 2023-07-11 MED ORDER — LACTATED RINGERS IV SOLN: INTRAVENOUS | Status: AC

## 2023-07-11 MED ORDER — LORAZEPAM 2 MG/ML IJ SOLN
1.0000 mg | Freq: Once | INTRAMUSCULAR | Status: AC
  Administered 2023-07-11: 1 mg via INTRAVENOUS

## 2023-07-11 MED ORDER — MORPHINE SULFATE (PF) 2 MG/ML IV SOLN
2.0000 mg | INTRAVENOUS | Status: DC | PRN
  Administered 2023-07-12 – 2023-07-13 (×4): 2 mg via INTRAVENOUS
  Filled 2023-07-11 (×4): qty 1

## 2023-07-11 MED ORDER — LORAZEPAM 2 MG/ML IJ SOLN
INTRAMUSCULAR | Status: AC
  Filled 2023-07-11: qty 1

## 2023-07-11 MED ORDER — LACTATED RINGERS IV BOLUS (SEPSIS)
1000.0000 mL | Freq: Once | INTRAVENOUS | Status: AC
  Administered 2023-07-11: 1000 mL via INTRAVENOUS

## 2023-07-11 MED ORDER — K PHOS MONO-SOD PHOS DI & MONO 155-852-130 MG PO TABS
500.0000 mg | ORAL_TABLET | Freq: Two times a day (BID) | ORAL | Status: AC
  Administered 2023-07-11 – 2023-07-12 (×2): 500 mg via ORAL
  Filled 2023-07-11 (×2): qty 2

## 2023-07-11 MED ORDER — MORPHINE SULFATE (PF) 4 MG/ML IV SOLN
4.0000 mg | Freq: Once | INTRAVENOUS | Status: AC
  Administered 2023-07-11: 4 mg via INTRAVENOUS
  Filled 2023-07-11: qty 1

## 2023-07-11 MED ORDER — SODIUM CHLORIDE 0.9% FLUSH
3.0000 mL | Freq: Two times a day (BID) | INTRAVENOUS | Status: DC
  Administered 2023-07-11 – 2023-07-15 (×8): 3 mL via INTRAVENOUS

## 2023-07-11 MED ORDER — LACTATED RINGERS IV BOLUS (SEPSIS)
250.0000 mL | Freq: Once | INTRAVENOUS | Status: AC
  Administered 2023-07-11: 250 mL via INTRAVENOUS

## 2023-07-11 MED ORDER — ONDANSETRON HCL 4 MG/2ML IJ SOLN: 4.0000 mg | Freq: Four times a day (QID) | INTRAMUSCULAR | Status: DC | PRN

## 2023-07-11 MED ORDER — SODIUM CHLORIDE 0.9 % IV SOLN
2.0000 g | INTRAVENOUS | Status: DC
  Administered 2023-07-11 – 2023-07-15 (×5): 2 g via INTRAVENOUS
  Filled 2023-07-11 (×5): qty 20

## 2023-07-11 MED ORDER — LORAZEPAM 2 MG/ML IJ SOLN: 0.0000 mg | Freq: Four times a day (QID) | INTRAMUSCULAR | Status: AC

## 2023-07-11 MED ORDER — GABAPENTIN 300 MG PO CAPS
1800.0000 mg | ORAL_CAPSULE | Freq: Every day | ORAL | Status: DC
  Administered 2023-07-11 – 2023-07-14 (×4): 1800 mg via ORAL
  Filled 2023-07-11 (×4): qty 6

## 2023-07-11 MED ORDER — THIAMINE MONONITRATE 100 MG PO TABS
100.0000 mg | ORAL_TABLET | Freq: Every day | ORAL | Status: DC
  Administered 2023-07-11 – 2023-07-15 (×5): 100 mg via ORAL
  Filled 2023-07-11 (×5): qty 1

## 2023-07-11 MED ORDER — GABAPENTIN 400 MG PO CAPS
1200.0000 mg | ORAL_CAPSULE | Freq: Every day | ORAL | Status: DC
Start: 2023-07-12 — End: 2023-07-15
  Administered 2023-07-12 – 2023-07-15 (×4): 1200 mg via ORAL
  Filled 2023-07-11 (×4): qty 3

## 2023-07-11 MED ORDER — LORAZEPAM 2 MG/ML IJ SOLN: 0.0000 mg | Freq: Two times a day (BID) | INTRAMUSCULAR | Status: DC

## 2023-07-11 MED ORDER — THIAMINE HCL 100 MG/ML IJ SOLN: 100.0000 mg | Freq: Every day | INTRAMUSCULAR | Status: DC

## 2023-07-11 MED ORDER — LORAZEPAM 2 MG/ML IJ SOLN: 1.0000 mg | INTRAMUSCULAR | Status: AC | PRN

## 2023-07-11 MED ORDER — NORTRIPTYLINE HCL 10 MG PO CAPS
10.0000 mg | ORAL_CAPSULE | Freq: Every evening | ORAL | Status: DC | PRN
  Filled 2023-07-11: qty 1

## 2023-07-11 MED ORDER — POTASSIUM CHLORIDE CRYS ER 20 MEQ PO TBCR
40.0000 meq | EXTENDED_RELEASE_TABLET | Freq: Once | ORAL | Status: DC
  Filled 2023-07-11: qty 2

## 2023-07-11 MED ORDER — CARVEDILOL 12.5 MG PO TABS
12.5000 mg | ORAL_TABLET | Freq: Two times a day (BID) | ORAL | Status: DC
Start: 2023-07-11 — End: 2023-07-15
  Administered 2023-07-11 – 2023-07-15 (×8): 12.5 mg via ORAL
  Filled 2023-07-11 (×8): qty 1

## 2023-07-11 MED ORDER — MAGNESIUM SULFATE 2 GM/50ML IV SOLN
2.0000 g | Freq: Once | INTRAVENOUS | Status: AC
  Administered 2023-07-11: 2 g via INTRAVENOUS
  Filled 2023-07-11: qty 50

## 2023-07-11 MED ORDER — LACTATED RINGERS IV BOLUS (SEPSIS)
500.0000 mL | Freq: Once | INTRAVENOUS | Status: AC
  Administered 2023-07-11: 500 mL via INTRAVENOUS

## 2023-07-11 MED ORDER — POTASSIUM CHLORIDE 10 MEQ/100ML IV SOLN
10.0000 meq | Freq: Once | INTRAVENOUS | Status: AC
  Administered 2023-07-11: 10 meq via INTRAVENOUS
  Filled 2023-07-11: qty 100

## 2023-07-11 MED ORDER — ONDANSETRON HCL 4 MG PO TABS
4.0000 mg | ORAL_TABLET | Freq: Four times a day (QID) | ORAL | Status: DC | PRN
Start: 2023-07-11 — End: 2023-07-15

## 2023-07-11 NOTE — ED Provider Notes (Signed)
 [place] EMERGENCY DEPARTMENT AT [facility] [place] Provider Note   CSN: [id number] Arrival date & time: 07/11/23  1032     History  Chief Complaint  Patient presents with  . Abdominal Pain    Samuel Cantrell is a 48 y.o. male.   Abdominal Pain    Patient has a history of NSTEMI acid reflux cirrhosis, alcohol abuse.  Patient states he has been cutting back on his alcohol consumption.  He last drink 2 days ago.  Patient's doctor told him to not quit cold [place].  Patient states he has been having trouble with abdominal pain ongoing for the last couple of months.  He is waiting to see a GI doctor.    Patient states he has been having pain in his diffuse abdomen area.  It goes to his back.  He has not had any vomiting.  He denies any diarrhea.He has had to do with weight loss as well.With the progression of his symptoms he went to an emergency room on May 1 at [place].  Patient states his symptoms have progressed so he came to the ED today  Home Medications Prior to Admission medications   Medication Sig Start Date End Date Taking? Authorizing Provider  carvedilol  (COREG ) 12.5 MG tablet TAKE 1 TABLET(12.5 MG) BY MOUTH TWICE DAILY WITH A MEAL 04/20/23   Saguier, Gaylin Ke, PA-C  diphenhydrAMINE (BENADRYL) 25 MG tablet Take 25 mg by mouth every 6 (six) hours as needed for itching or allergies.     [provider]  folic acid  (FOLVITE ) 1 MG tablet TAKE 1 TABLET BY MOUTH DAILY 01/08/23   Jhonny Moss, MD  gabapentin  (NEURONTIN ) 600 MG tablet Take 2 caps in AM, 3 caps in PM 09/02/22   Jhonny Moss, MD  nortriptyline  (PAMELOR ) 10 MG capsule TAKE 1 CAPSULE(10 MG) BY MOUTH AT BEDTIME 03/09/23   Jhonny Moss, MD  potassium chloride  SA (KLOR-CON  M) 20 MEQ tablet Take 1 tablet (20 mEq total) by mouth daily. 04/11/23   Saguier, Gaylin Ke, PA-C  rivaroxaban  (XARELTO ) 10 MG TABS tablet Take 1 tablet (10 mg total) by mouth daily. Patient not taking: Reported on 05/21/2023 04/20/23    Ivor Mars, MD  thiamine  (VITAMIN B1) 100 MG tablet Take 1 tablet (100 mg total) by mouth daily. 09/02/22   Jhonny Moss, MD      Allergies    Patient has no known allergies.    Review of Systems   Review of Systems  Gastrointestinal:  Positive for abdominal pain.    Physical Exam Updated Vital Signs BP 130/82   Pulse (!) 121   Temp 98.4 F (36.9 C)   Resp (!) 22   Ht 1.727 m (5\' 8" )   Wt 52.2 kg   SpO2 99%   BMI 17.49 kg/m  Physical Exam Vitals and nursing note reviewed.  Constitutional:      Appearance: He is well-developed. He is not diaphoretic.  HENT:     Head: Normocephalic and atraumatic.     Right Ear: External ear normal.     Left Ear: External ear normal.  Eyes:     General: No scleral icterus.       Right eye: No discharge.        Left eye: No discharge.     Conjunctiva/sclera: Conjunctivae normal.  Neck:     Trachea: No tracheal deviation.  Cardiovascular:     Rate and Rhythm: Normal rate and regular rhythm.  Pulmonary:  Effort: Pulmonary effort is normal. No respiratory distress.     Breath sounds: Normal breath sounds. No stridor. No wheezing or rales.  Abdominal:     General: Bowel sounds are normal. There is distension.     Palpations: Abdomen is soft. There is no mass.     Tenderness: There is no abdominal tenderness. There is no guarding or rebound.  Musculoskeletal:        General: No tenderness or deformity.     Cervical back: Neck supple.  Skin:    General: Skin is warm and dry.     Findings: No rash.  Neurological:     General: No focal deficit present.     Mental Status: He is alert.     Cranial Nerves: No cranial nerve deficit, dysarthria or facial asymmetry.     Sensory: No sensory deficit.     Motor: No abnormal muscle tone or seizure activity.     Coordination: Coordination normal.  Psychiatric:        Mood and Affect: Mood normal.     ED Results / Procedures / Treatments   Labs (all labs ordered are listed, but  only abnormal results are displayed) Labs Reviewed  COMPREHENSIVE METABOLIC PANEL WITH GFR - Abnormal; Notable for the following components:      Result Value   Potassium 3.1 (*)    Chloride 96 (*)    CO2 20 (*)    Glucose, Bld 108 (*)    BUN <5 (*)    Calcium 8.0 (*)    Albumin 2.7 (*)    AST 287 (*)    ALT 70 (*)    Alkaline Phosphatase 212 (*)    Total Bilirubin 2.6 (*)    Anion gap 22 (*)    All other components within normal limits  LIPASE, BLOOD - Abnormal; Notable for the following components:   Lipase 64 (*)    All other components within normal limits  CBC WITH DIFFERENTIAL/PLATELET - Abnormal; Notable for the following components:   WBC 12.2 (*)    RBC 2.94 (*)    Hemoglobin 8.8 (*)    HCT 25.3 (*)    RDW 33.5 (*)    Platelets 92 (*)    nRBC 0.5 (*)    Monocytes Absolute 1.2 (*)    All other components within normal limits  URINALYSIS, ROUTINE W REFLEX MICROSCOPIC - Abnormal; Notable for the following components:   Bilirubin Urine LARGE (*)    Ketones, ur 15 (*)    Protein, ur 30 (*)    All other components within normal limits  PROTIME-INR - Abnormal; Notable for the following components:   Prothrombin Time 20.1 (*)    INR 1.7 (*)    All other components within normal limits  LACTIC ACID, PLASMA - Abnormal; Notable for the following components:   Lactic Acid, Venous 5.2 (*)    All other components within normal limits  URINALYSIS, MICROSCOPIC (REFLEX) - Abnormal; Notable for the following components:   Bacteria, UA RARE (*)    All other components within normal limits  CULTURE, BLOOD (ROUTINE X 2)  CULTURE, BLOOD (ROUTINE X 2)  BODY FLUID CULTURE W GRAM STAIN  LACTIC ACID, PLASMA  LACTATE DEHYDROGENASE, PLEURAL OR PERITONEAL FLUID  GLUCOSE, PLEURAL OR PERITONEAL FLUID  PROTEIN, PLEURAL OR PERITONEAL FLUID  ALBUMIN, PLEURAL OR PERITONEAL FLUID   BODY FLUID CELL COUNT WITH DIFFERENTIAL  TROPONIN T, HIGH SENSITIVITY  TROPONIN T, HIGH SENSITIVITY     EKG EKG  Interpretation Date/Time:  Saturday Jul 11 2023 11:08:36 EDT Ventricular Rate:  116 PR Interval:  144 QRS Duration:  84 QT Interval:  349 QTC Calculation: 485 R Axis:   47  Text Interpretation: Sinus tachycardia Atrial premature complexes Borderline T wave abnormalities Borderline prolonged QT interval Since last tracing rate faster Confirmed by Trish Furl [phone]) on 07/11/2023 11:13:09 AM  Radiology No results found.  Procedures ABDOMINAL PARACENTESIS  Date/Time: 07/11/2023 12:58 PM  Performed by: Trish Furl, MD Authorized by: Trish Furl, MD  Consent: Verbal consent obtained. Risks and benefits: risks, benefits and alternatives were discussed Consent given by: patient and spouse Patient identity confirmed: verbally with patient Time out: Immediately prior to procedure a "time out" was called to verify the correct patient, procedure, equipment, support staff and site/side marked as required. Preparation: Patient was prepped and draped in the usual sterile fashion. Local anesthesia used: yes Anesthesia: local infiltration  Anesthesia: Local anesthesia used: yes Local Anesthetic: lidocaine 1% without epinephrine  Sedation: Patient sedated: no  Patient tolerance: patient tolerated the procedure well with no immediate complications Comments: 20 cc yellow fluid obtained   .Critical Care  Performed by: Trish Furl, MD Authorized by: Trish Furl, MD   Critical care provider statement:    Critical care time (minutes):  30   Critical care was time spent personally by me on the following activities:  Development of treatment plan with patient or surrogate, discussions with consultants, evaluation of patient's response to treatment, examination of patient, ordering and review of laboratory studies, ordering and review of radiographic studies, ordering and performing treatments and interventions, pulse oximetry, re-evaluation of patient's condition and review of old  charts     Medications Ordered in ED Medications  lactated ringers infusion (has no administration in time range)  lactated ringers bolus 1,000 mL (1,000 mLs Intravenous New Bag/Given 07/11/23 1233)    And  lactated ringers bolus 500 mL (has no administration in time range)    And  lactated ringers bolus 250 mL (has no administration in time range)  ceFEPIme (MAXIPIME) 2 g in sodium chloride  0.9 % 100 mL IVPB (2 g Intravenous New Bag/Given 07/11/23 1236)  metroNIDAZOLE  (FLAGYL ) IVPB 500 mg (has no administration in time range)  LORazepam  (ATIVAN ) 2 MG/ML injection (  Not Given 07/11/23 1257)  morphine (PF) 4 MG/ML injection 4 mg (4 mg Intravenous Given 07/11/23 1229)  LORazepam  (ATIVAN ) injection 1 mg (1 mg Intravenous Given 07/11/23 1251)    ED Course/ Medical Decision Making/ A&P Clinical Course as of 07/11/23 1347  Sat Jul 11, 2023  1204 Urinalysis, Routine w reflex microscopic -Urine, Clean Catch(!) Urinalysis not suggestive of urinary tract infection [JK]  1212 Lipase, blood(!) Lipase increased at 64.  Troponin normal. [JK]  1212 Comprehensive metabolic panel(!) LFTs show elevated bilirubin with increased anion gap [JK]  1215 Lactic acid level elevated at 5.2.  Will initiate sepsis protocol [JK]  1335 Case discussed with Dr. Bonita Bussing regarding admission.  Potassium replacement ordered.  Magnesium phosphorus laboratory tests added on [JK]    Clinical Course User Index [JK] Trish Furl, MD                                 Medical Decision Making Amount and/or Complexity of Data Reviewed Labs: ordered. Decision-making details documented in ED Course.  Risk Prescription drug management.  Records reviewed from his visit at [facility] emergency room.  Patient was noted to  have a white count of 12.1 with a hemoglobin of 9.7.  Platelet count was 108.  He had a CT scan of his abdomen pelvis that showed moderate ascites with some subtle enhancement of the pancreatic head which possibly could be  related to pancreatitis.  In the ED patient was noted to be tachycardic.  Was concerned about the possibility of alcohol withdrawal, developing delirium tremens, sepsis.  His CT scan 2 days ago did not show any signs of colitis or diverticulitis.  Did not feel that repeat CT imaging was necessary at this time.  Patient was noted to have leukocytosis.  Mild elevation of his lipase which could suggest a component of pancreatitis causing his abdominal and back pain.  Patient's LFTs are elevated consistent with his known cirrhosis and liver disease.  Patient has significantly elevated lactic acid level and remained tachycardic although no signs of hypotension.  Initiated sepsis protocol.  Bedside diagnostic paracentesis was performed.  Will send the fluid off for analysis.  Patient was treated with antibiotics to cover for possible SBP.  I also given a dose of Ativan  for alcohol withdrawal symptoms.  Will consult with the medical service for admission.  Patient will need transfer from this freestanding emergency department.        Final Clinical Impression(s) / ED Diagnoses Final diagnoses:  SBP (spontaneous bacterial peritonitis) (HCC)  Alcohol withdrawal syndrome with complication (HCC)  Sepsis, due to unspecified organism, unspecified whether acute organ dysfunction present [facility])    Rx / DC Orders ED Discharge Orders     None         Trish Furl, MD 07/11/23 1302

## 2023-07-11 NOTE — ED Notes (Signed)
 [place] at bedside

## 2023-07-11 NOTE — ED Notes (Signed)
 Attempted to call report. Nurse at lunch and CN unavailable. Nurse to return call when back from lunch

## 2023-07-11 NOTE — Progress Notes (Signed)
 Elink following for sepsis protocol.

## 2023-07-11 NOTE — ED Notes (Signed)
 ED Provider at bedside.

## 2023-07-11 NOTE — ED Notes (Signed)
 Ultrasound at bedside

## 2023-07-11 NOTE — ED Notes (Signed)
 Report given to [facility], eta about 15-20 min

## 2023-07-11 NOTE — Progress Notes (Signed)
 Plan of Care Note for accepted transfer   Patient: Samuel Cantrell MRN: [id number]   DOA: 07/11/2023  Facility requesting transfer: [facility].  Requesting Provider: Mikeal Alder, MD. Reason for transfer: Sepsis due to undetermined organism, alcohol withdrawal. Facility course:  48 year old male with a past medical history of CAD, NSTEMI, GERD, polyneuropathy, hypertension, hypertension emergency, elevated troponin, history of DVT off rivaroxaban  already, tobacco abuse, alcohol abuse, liver cirrhosis, thrombocytopenia who presented to the emergency department complaints of generalized abdominal pain.  He has been given IV fluids, cefepime and metronidazole .  Paracentesis was performed for diagnostic purposes, but results are still pending.  Lab work: Research officer, political party [[id number]] (Abnormal)   Collected: 07/11/23 1113   Updated: 07/11/23 1211   Specimen Type: Blood    Sodium 138 mmol/L   Potassium 3.1 Low  mmol/L   Chloride 96 Low  mmol/L   CO2 20 Low  mmol/L   Glucose, Bld 108 High  mg/dL   BUN <5 Low  mg/dL   Creatinine, Ser 9.56 mg/dL   Calcium 8.0 Low  mg/dL   Total Protein 7.8 g/dL   Albumin 2.7 Low  g/dL   AST 213 High  U/L   ALT 70 High  U/L   Alkaline Phosphatase 212 High  U/L   Total Bilirubin 2.6 High  mg/dL   GFR, Estimated >08 mL/min   Anion gap 22 High   Lipase, blood [[id number]] (Abnormal)   Collected: 07/11/23 1113   Updated: 07/11/23 1211   Specimen Type: Blood    Lipase 64 High  U/L  Troponin T, High Sensitivity [[id number]]   Collected: 07/11/23 1113   Updated: 07/11/23 1211   Specimen Type: Blood    Troponin T High Sensitivity <15 ng/L  Urinalysis, Microscopic (reflex) [[id number]] (Abnormal)   Collected: 07/11/23 1127   Updated: 07/11/23 1154    RBC / HPF 0-5 RBC/hpf   WBC, UA 0-5 WBC/hpf   Bacteria, UA RARE Abnormal    Squamous Epithelial / HPF 0-5 /HPF   Hyaline Casts, UA PRESENT  Urinalysis, Routine w reflex microscopic  -Urine, Clean Catch [[id number]] (Abnormal)   Collected: 07/11/23 1127   Updated: 07/11/23 1154   Specimen Source: Urine, Clean Catch    Color, Urine YELLOW   APPearance CLEAR   Specific Gravity, Urine 1.025   pH 5.0   Glucose, UA NEGATIVE mg/dL   Hgb urine dipstick NEGATIVE   Bilirubin Urine LARGE Abnormal    Ketones, ur 15 Abnormal  mg/dL   Protein, ur 30 Abnormal  mg/dL   Nitrite NEGATIVE   Leukocytes,Ua NEGATIVE  Protime-INR [[id number]] (Abnormal)   Collected: 07/11/23 1113   Updated: 07/11/23 1141   Specimen Type: Blood    Prothrombin Time 20.1 High  seconds   INR 1.7 High    Imaging: Ultrasound of the abdomen has been ordered but is still pending.  Plan of care: The patient is accepted for admission to [facility] unit, at [facility].  Author: Danice Dural, MD 07/11/2023  Check [email or web address] for on-call coverage.  Nursing staff, Please call [facility] Admits & Consults System-Wide number on Amion as soon as patient's arrival, so appropriate admitting provider can evaluate the pt.

## 2023-07-11 NOTE — ED Notes (Signed)
 Pt and wife agreed to bedside paracenteses. Pt tolerated well. Fluid sent to lab for analysis.

## 2023-07-11 NOTE — H&P (Signed)
 History and Physical    Patient: Samuel Cantrell:[id number] DOB: 1975/10/18 DOA: 07/11/2023 DOS: the patient was seen and examined on 07/11/2023 PCP: Sylvia Everts, PA-C  Patient coming from: Home  Chief Complaint:  Chief Complaint  Patient presents with   Abdominal Pain   HPI: Samuel Cantrell is a 48 y.o. male with medical history significant of hypertension, NSTEMI, history of DVT not on anticoagulation, hepatic cirrhosis, alcohol abuse, tobacco abuse, and GERD presents with complaints of abdominal and back pain. He is accompanied by his son and wife.  He has been experiencing diffuse abdominal pain for about a month, which has been worsening over time and has started to radiate to his back. He feels the pain originates in the back. He reports nausea and constipation but denies fever, chills, vomiting, abdominal swelling, and leg swelling.  He has a history of alcohol use, with the last drink being two days ago. He used to consume two to three shots daily but has been reducing his intake. He previously attempted to stop drinking abruptly, which resulted in feeling unwell. He is currently on a protocol to manage potential alcohol withdrawal symptoms.  He smokes approximately half a pack of cigarettes daily.   Admission into the emergency department patient was noted to be afebrile with pulse elevated up to 132, patient's 8-22, and all other vital signs maintained labs significant for WBC 12.2, hemoglobin 8.8, platelets 92, potassium 3.1, CO2 20, anion gap 22, phosphorus 2.1, magnesium 1.5, alkaline phosphatase 212, lipase 64, AST 287, ALT 70, total bilirubin 2.6, high-sensitivity troponins negative x 2, and lactic acid 5.2->6.5.  Urinalysis was positive for ketones and large bilirubin, but did not note significant signs for infection.  Ultrasound was limited due to hepatic cirrhosis but did note cholelithiasis and gallbladder sludge with mild diffuse gallbladder wall thickening  thought secondary to cirrhosis with no sonographic Murphy sign.  Paracentesis was performed with studies pending.  Blood cultures were obtained.  Patient had been given 1.75 L of IV fluids, Ativan  1 mg IV, cefepime, and metronidazole .  Review of Systems: As mentioned in the history of present illness. All other systems reviewed and are negative. Past Medical History:  Diagnosis Date   Alcohol abuse    Allergy    Anemia    Cirrhosis (HCC)    GERD (gastroesophageal reflux disease)    Hypertensive urgency 02/28/2016   Past Surgical History:  Procedure Laterality Date   NO PAST SURGERIES     Social History:  reports that he has been smoking cigarettes. He has a 4 pack-year smoking history. He has never used smokeless tobacco. He reports current alcohol use. He reports that he does not use drugs.  No Known Allergies  Family History  Problem Relation Age of Onset   Hypertension Mother     Prior to Admission medications   Medication Sig Start Date End Date Taking? Authorizing Provider  carvedilol  (COREG ) 12.5 MG tablet TAKE 1 TABLET(12.5 MG) BY MOUTH TWICE DAILY WITH A MEAL 04/20/23   Saguier, Gaylin Ke, PA-C  diphenhydrAMINE (BENADRYL) 25 MG tablet Take 25 mg by mouth every 6 (six) hours as needed for itching or allergies.     [provider]  folic acid  (FOLVITE ) 1 MG tablet TAKE 1 TABLET BY MOUTH DAILY 01/08/23   Jhonny Moss, MD  gabapentin  (NEURONTIN ) 600 MG tablet Take 2 caps in AM, 3 caps in PM 09/02/22   Jhonny Moss, MD  nortriptyline  (PAMELOR ) 10 MG capsule TAKE 1  CAPSULE(10 MG) BY MOUTH AT BEDTIME 03/09/23   Jhonny Moss, MD  potassium chloride  SA (KLOR-CON  M) 20 MEQ tablet Take 1 tablet (20 mEq total) by mouth daily. 04/11/23   Saguier, Gaylin Ke, PA-C  rivaroxaban  (XARELTO ) 10 MG TABS tablet Take 1 tablet (10 mg total) by mouth daily. Patient not taking: Reported on 05/21/2023 04/20/23   Ivor Mars, MD  thiamine  (VITAMIN B1) 100 MG tablet Take 1 tablet (100 mg  total) by mouth daily. 09/02/22   Jhonny Moss, MD    Physical Exam: Vitals:   07/11/23 1130 07/11/23 1230 07/11/23 1327 07/11/23 1345  BP: 130/82  121/83 122/88  Pulse: (!) 121 (!) 119 (!) 122 (!) 123  Resp: (!) 22  (!) 8 11  Temp: 98.4 F (36.9 C)     TempSrc:      SpO2: 99%  97% 96%  Weight:      Height:       Constitutional: Middle-age male who appears to be ill Eyes: PERRL, sclera icterus present. ENMT: Mucous membranes are moist.  Fair dentition. Neck: normal, supple  Respiratory: clear to auscultation bilaterally, no wheezing, no crackles. Normal respiratory effort. No accessory muscle use.  Cardiovascular: Tachycardic. No extremity edema.   Abdomen: Distended abdomen that is tense but no tenderness to palpation appreciated Musculoskeletal: no clubbing / cyanosis. No joint deformity upper and lower extremities. Good ROM, no contractures. Normal muscle tone.  Skin: no rashes, lesions, ulcers. No induration Neurologic: CN 2-12 grossly intact. Strength 5/5 in all 4.  Psychiatric: Lethargic but oriented to person and place.  Data Reviewed:   EKG reveals sinus tachycardia 106 bpm with premature atrial complexes.  Reviewed labs, imaging, and pertinent records as documented.  Assessment and Plan:  SIRS/question sepsis, undetermined Lactic acidosis Patient presented with complaints of abdominal pain going into his back.  Noted to be tachycardia, tachypnea, and white blood cell count elevated to 12.2 meeting SIRS criteria.  Right upper quadrant abdominal ultrasound revealed hepatic cirrhosis with cholelithiasis and gallbladder sludge, mild diffuse gallbladder wall thickening likely due to cirrhosis with no sonographic Murphy sign and moderate ascites.  Lipase was noted to be elevated 64 and initial lactic acids elevated up to 6.5.  Blood cultures have been obtained and patient had initially been started on empiric antibiotics of cefepime and metronidazole .   - Admit to a stepdown  bed - Follow-up blood cultures - Continue empiric antibiotics of Rocephin and metronidazole .  De-escalate when medically appropriate. - Continue IV fluids - Continue to trend lactic acid Addendum:Peritoneal fluid appeared to be transudative likely related to patient's cirrhosis with low neutrophil count for which which spontaneous bacterial peritonitis seems less likely  Possible alcohol withdrawals Alcohol abuse Patient reports last drink was 2 days ago. - CIWA protocols initiated with Ativan  as needed - Thiamine , folic acid , MVI  Decompensated cirrhosis with ascites Patient presents with complaints of abdominal distention and pain.  Noted to have moderate ascites on physical exam.  MELD equal to 10. - Ultrasound guided paracentesis - Morphine IV as needed for abdominal pain  Pancytopenia Acute on chronic.  Hemoglobin noted to be 8.8 which is acute drop from previous 11.1 when checked on 3/13.  Platelet count 92 which appears improved.  Patient has not recently been on Xarelto . - Type and screen for possible need of blood products - Recheck H&H and check stool guaiac - Consider transfusion for blood counts less than 7 g/dL or clinically symptomatic.  Hypokalemia Acute.  Potassium noted  to be 3.1.  Patient had been given 10 mEq of potassium chloride  IV -Give potassium chloride  40 mill equivalents p.o. - Continue to monitor and replace as needed  History of DVT Patient is supposed to be on Xarelto  due to history of DVTs.  Previously noted to have a right upper extremity DVT in 10/2016 and subsequent left upper extremity DVT back in 10/2018.  Currently not on medication due to cost.   Elevated liver function stiudies Acute on chronic.  Labs significant for alkaline phosphatase 212, AST 287, ALT 70, and bilirubin 2.6.   Related to patient's history of alcohol abuse. -Recheck LFTs in a.m.   DVT prophylaxis: SCDs Advance Care Planning:   Code Status: Full Code    Consults:  None  Family Communication: Wife  Severity of Illness: The appropriate patient status for this patient is INPATIENT. Inpatient status is judged to be reasonable and necessary in order to provide the required intensity of service to ensure the patient's safety. The patient's presenting symptoms, physical exam findings, and initial radiographic and laboratory data in the context of their chronic comorbidities is felt to place them at high risk for further clinical deterioration. Furthermore, it is not anticipated that the patient will be medically stable for discharge from the hospital within 2 midnights of admission.   * I certify that at the point of admission it is my clinical judgment that the patient will require inpatient hospital care spanning beyond 2 midnights from the point of admission due to high intensity of service, high risk for further deterioration and high frequency of surveillance required.*  Author: Lena Qualia, MD 07/11/2023 3:03 PM  For on call review [email or web address].

## 2023-07-11 NOTE — ED Notes (Signed)
Called [facility] for transport 

## 2023-07-11 NOTE — ED Triage Notes (Signed)
 Abd and back pain x 2 months. Seen at [facility] ED a couple days ago for same .

## 2023-07-11 NOTE — ED Notes (Signed)
 [facility] into transport to [facility] Report given to [place] at [facility]

## 2023-07-12 ENCOUNTER — Inpatient Hospital Stay (HOSPITAL_COMMUNITY): Payer: Self-pay

## 2023-07-12 LAB — CBC
HCT: 21.7 % — ABNORMAL LOW (ref 39.0–52.0)
Hemoglobin: 7.1 g/dL — ABNORMAL LOW (ref 13.0–17.0)
MCH: 29.8 pg (ref 26.0–34.0)
MCHC: 32.7 g/dL (ref 30.0–36.0)
MCV: 91.2 fL (ref 80.0–100.0)
Platelets: DECREASED 10*3/uL (ref 150–400)
RBC: 2.38 MIL/uL — ABNORMAL LOW (ref 4.22–5.81)
RDW: 33.6 % — ABNORMAL HIGH (ref 11.5–15.5)
WBC: 11.7 10*3/uL — ABNORMAL HIGH (ref 4.0–10.5)
nRBC: 0.9 % — ABNORMAL HIGH (ref 0.0–0.2)

## 2023-07-12 LAB — COMPREHENSIVE METABOLIC PANEL WITH GFR
ALT: 55 U/L — ABNORMAL HIGH (ref 0–44)
AST: 235 U/L — ABNORMAL HIGH (ref 15–41)
Albumin: 2 g/dL — ABNORMAL LOW (ref 3.5–5.0)
Alkaline Phosphatase: 130 U/L — ABNORMAL HIGH (ref 38–126)
Anion gap: 9 (ref 5–15)
BUN: 7 mg/dL (ref 6–20)
CO2: 27 mmol/L (ref 22–32)
Calcium: 7.6 mg/dL — ABNORMAL LOW (ref 8.9–10.3)
Chloride: 100 mmol/L (ref 98–111)
Creatinine, Ser: 0.48 mg/dL — ABNORMAL LOW (ref 0.61–1.24)
GFR, Estimated: >60 mL/min (ref >60)
Glucose, Bld: 93 mg/dL (ref 70–99)
Potassium: 3.9 mmol/L (ref 3.5–5.1)
Sodium: 136 mmol/L (ref 135–145)
Total Bilirubin: 3 mg/dL — ABNORMAL HIGH (ref 0.0–1.2)
Total Protein: 6.6 g/dL (ref 6.5–8.1)

## 2023-07-12 LAB — TYPE AND SCREEN

## 2023-07-12 LAB — ABO/RH: ABO/RH(D): B POS

## 2023-07-12 LAB — HIV ANTIBODY (ROUTINE TESTING W REFLEX): HIV Screen 4th Generation wRfx: NONREACTIVE

## 2023-07-12 MED ORDER — LIDOCAINE HCL 1 % IJ SOLN
INTRAMUSCULAR | Status: AC
  Filled 2023-07-12: qty 20

## 2023-07-12 NOTE — Progress Notes (Signed)
 PROGRESS NOTE  Jeffry Minister  DOB: [birth year]  PCP: Sylvia Everts, PA-C QMV:[id number]  DOA: 07/11/2023  LOS: 1 day  Hospital Day: 2  Brief narrative: Samuel Cantrell is a 48 y.o. 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 5/3, patient presented to the ED with complaint of abdominal pain, back pain. Back pain worsening for a month.  Drinks 2-3 shots of alcohol daily, lately cutting down.  Last drink 2 days ago.  Smokes half a pack of cigarettes daily.  In the ED, she was afebrile, heart rate of 130, blood pressure 130s, breathing on room air WBC count 12.2, hemoglobin 8.8, platelet 91, lactic acid was elevated to 5.2 > 6.5 Potassium 3.1, BUN/creatinine not elevated, magnesium 1.5, phosphorus 2.1, AST, ALT, alk phos and total bili elevated Urinalysis with clear yellow urine not suggestive of infection. Ultrasound abdomen showed moderate ascites, no evidence of cholecystitis. Blood culture was sent Paracentesis was done Patient was given IV hydration, IV antibiotics Admitted to [facility]  Subjective: Patient was seen and examined this morning. Thin built African-American male.  Not in distress.  Mild tremors.  Has abdominal distention from ascites. Family not at bedside Chart reviewed No fever, heart rate 100s, blood pressure 130s WBC 11.7, hemoglobin 7.1,   Assessment and plan: SIRS- POA Lactic acidosis presented with complaints of abdominal pain going into his back.   Noted to be tachycardia, tachypnea, and white blood cell count elevated to 12.2 meeting SIRS criteria.   No clear evidence of infection.  I wonder if lactic acidosis was due to arterial hypovolemia due to low albumin level Blood culture sent.  Peritoneal fluid analysis shows low neutrophils -transudative WBC count better. Continue empiric antibiotic coverage with IV Rocephin and IV Flagyl  Continue IV fluid Lactic acid downtrending.   Repeat tomorrow. Recent Labs  Lab 07/11/23 1113 07/11/23 1318 07/11/23 1543 07/11/23 1811 07/12/23 0306  WBC 12.2*  --   --   --  11.7*  LATICACIDVEN 5.2* 6.5* 5.6* 4.3*  --    Chronic alcohol use Possible alcohol withdrawals Patient reports last drink was 2 days ago. Continue CIWA protocol with as needed Ativan   Continue thiamine , folic acid , MVI   Decompensated cirrhosis with ascites Moderate ascites noted on CT scan  Ultrasound guided paracentesis ordered for today. Trend LFTs Morphine IV as needed for abdominal pain Recent Labs  Lab 07/11/23 1113 07/12/23 0306  AST 287* 235*  ALT 70* 55*  ALKPHOS 212* 130*  BILITOT 2.6* 3.0*  PROT 7.8 6.6  ALBUMIN 2.7* 2.0*  INR 1.7*  --   LIPASE 64*  --   PLT 92* PLATELET CLUMPS NOTED ON SMEAR, COUNT APPEARS DECREASED    Pancytopenia Acute on chronic anemia.  No active bleeding.  Not on anticoagulation Hemoglobin down to 7.1 today.  Continue to monitor.  Transfuse if less than 7 Recent Labs  Lab 07/11/23 1113 07/11/23 2349 07/12/23 0306  WBC 12.2*  --  11.7*  NEUTROABS 7.4  --   --   HGB 8.8* 7.2* 7.1*  HCT 25.3* 22.2* 21.7*  MCV 86.1  --  91.2  PLT 92*  --  PLATELET CLUMPS NOTED ON SMEAR, COUNT APPEARS DECREASED   Hypokalemia/hypomagnesemia/hypophosphatemia All electrolytes low.  Replacements given. Recent Labs  Lab 07/11/23 1113 07/12/23 0306  K 3.1* 3.9  MG 1.5*  --   PHOS 2.1*  --    H/o DVT Patient is supposed to be on Xarelto  due  to history of DVTs.  Previously noted to have a right upper extremity DVT in 10/2016 and subsequent left upper extremity DVT back in 10/2018.  Currently not on medication due to cost.  Probably also affected by high risk of GI bleeding.   Mobility: PT eval ordered  Goals of care   Code Status: Full Code     DVT prophylaxis:  Place and maintain sequential compression device Start: 07/12/23 0957   Antimicrobials: IV Rocephin, IV Flagyl  Fluid: None currently Consultants:  None Family Communication: None at bedside  Status: Inpatient Level of care:  Stepdown   Patient is from: Home Needs to continue in-hospital care: Needs paracentesis, at risk of alcohol withdrawal, may need blood transfusion tomorrow. Anticipated d/c to: Hopefully home in next 2 to 3 days    Diet:  Diet Order             Diet 2 gram sodium Room service appropriate? Yes; Fluid consistency: Thin  Diet effective now                   Scheduled Meds:  carvedilol   12.5 mg Oral BID WC   Chlorhexidine Gluconate Cloth  6 each Topical Daily   folic acid   1 mg Oral Daily   gabapentin   1,200 mg Oral Daily   gabapentin   1,800 mg Oral QHS   LORazepam   0-4 mg Intravenous Q6H   Followed by   Cecily Cohen ON 07/13/2023] LORazepam   0-4 mg Intravenous Q12H   multivitamin with minerals  1 tablet Oral Daily   nicotine   14 mg Transdermal Daily   phosphorus  500 mg Oral BID   potassium chloride   40 mEq Oral Once   sodium chloride  flush  3 mL Intravenous Q12H   thiamine   100 mg Oral Daily   Or   thiamine   100 mg Intravenous Daily    PRN meds: albuterol, LORazepam  **OR** LORazepam , morphine injection, nortriptyline , ondansetron  **OR** ondansetron  (ZOFRAN ) IV   Infusions:   cefTRIAXone (ROCEPHIN)  IV Stopped (07/11/23 2025)   metronidazole  Stopped (07/12/23 0047)    Antimicrobials: Anti-infectives (From admission, onward)    Start     Dose/Rate Route Frequency Ordered Stop   07/12/23 0000  metroNIDAZOLE  (FLAGYL ) IVPB 500 mg        500 mg 100 mL/hr over 60 Minutes Intravenous Every 12 hours 07/11/23 1516     07/11/23 2000  cefTRIAXone (ROCEPHIN) 2 g in sodium chloride  0.9 % 100 mL IVPB        2 g 200 mL/hr over 30 Minutes Intravenous Every 24 hours 07/11/23 1516     07/11/23 1230  ceFEPIme (MAXIPIME) 2 g in sodium chloride  0.9 % 100 mL IVPB        2 g 200 mL/hr over 30 Minutes Intravenous  Once 07/11/23 1216 07/11/23 1318   07/11/23 1230  metroNIDAZOLE  (FLAGYL ) IVPB 500 mg        500  mg 100 mL/hr over 60 Minutes Intravenous  Once 07/11/23 1216 07/11/23 1417       Objective: Vitals:   07/12/23 0727 07/12/23 0800  BP:  132/86  Pulse: (!) 112 (!) 102  Resp: 17 13  Temp: 98.3 F (36.8 C)   SpO2: (!) 83% 94%    Intake/Output Summary (Last 24 hours) at 07/12/2023 0956 Last data filed at 07/12/2023 0657 Gross per 24 hour  Intake 6552.14 ml  Output --  Net 6552.14 ml   Filed Weights   07/11/23 1051  Weight: 52.2 kg  Weight change:  Body mass index is 17.49 kg/m.   Physical Exam: General exam: Pleasant, middle-aged African-American male.  Thin built Skin: No rashes, lesions or ulcers. HEENT: Atraumatic, normocephalic, no obvious bleeding Lungs: Clear to auscultation bilaterally,  CVS: S1, S2, no murmur,   GI/Abd: Soft, nontender, distended from ascites, bowel sound present,   CNS: Alert, awake, oriented x 3, mild alcohol-related tremors Psychiatry: Sad affect Extremities: No pedal edema, no calf tenderness,   Data Review: I have personally reviewed the laboratory data and studies available.  F/u labs ordered Unresulted Labs (From admission, onward)     Start     Ordered   07/13/23 0500  CBC with Differential/Platelet  Tomorrow morning,   R       Question:  Specimen collection method  Answer:  Lab=Lab collect   07/12/23 0954   07/13/23 0500  Comprehensive metabolic panel with GFR  Tomorrow morning,   R       Question:  Specimen collection method  Answer:  Lab=Lab collect   07/12/23 0954   07/13/23 0500  Ammonia  Tomorrow morning,   R       Question:  Specimen collection method  Answer:  Lab=Lab collect   07/12/23 0954   07/13/23 0500  Lactic acid, plasma  (Lactic Acid)  Tomorrow morning,   R       Question:  Specimen collection method  Answer:  Lab=Lab collect   07/12/23 0954   07/12/23 0500  HIV Antibody (routine testing w rflx)  Once,   R        07/12/23 0500   07/11/23 2329  Occult blood card to lab, stool  ONCE - STAT,   STAT        07/11/23  2328   07/11/23 1257  Pathologist smear review  Once,   R        07/11/23 1257           Signed, Hoyt Macleod, MD [facility] 07/12/2023

## 2023-07-12 NOTE — Plan of Care (Signed)
 Placed on 3L [place] due to sats staying in the 80's, provider is aware.  Paracentesis was performed today and removed 2.5 L of ascites.

## 2023-07-13 LAB — CBC WITH DIFFERENTIAL/PLATELET
Abs Immature Granulocytes: 0.08 10*3/uL — ABNORMAL HIGH (ref 0.00–0.07)
Basophils Absolute: 0.1 10*3/uL (ref 0.0–0.1)
Basophils Relative: 1 %
Eosinophils Absolute: 0.1 10*3/uL (ref 0.0–0.5)
Eosinophils Relative: 1 %
HCT: 22.8 % — ABNORMAL LOW (ref 39.0–52.0)
Hemoglobin: 7.4 g/dL — ABNORMAL LOW (ref 13.0–17.0)
Immature Granulocytes: 1 %
Lymphocytes Relative: 20 %
Lymphs Abs: 2.6 10*3/uL (ref 0.7–4.0)
MCH: 30.2 pg (ref 26.0–34.0)
MCHC: 32.5 g/dL (ref 30.0–36.0)
MCV: 93.1 fL (ref 80.0–100.0)
Monocytes Absolute: 1.6 10*3/uL — ABNORMAL HIGH (ref 0.1–1.0)
Monocytes Relative: 12 %
Neutro Abs: 8.5 10*3/uL — ABNORMAL HIGH (ref 1.7–7.7)
Neutrophils Relative %: 65 %
Platelets: 93 10*3/uL — ABNORMAL LOW (ref 150–400)
RBC: 2.45 MIL/uL — ABNORMAL LOW (ref 4.22–5.81)
RDW: 33.8 % — ABNORMAL HIGH (ref 11.5–15.5)
WBC: 13 10*3/uL — ABNORMAL HIGH (ref 4.0–10.5)
nRBC: 0.7 % — ABNORMAL HIGH (ref 0.0–0.2)

## 2023-07-13 LAB — PATHOLOGIST SMEAR REVIEW: Path Review: NEGATIVE

## 2023-07-13 LAB — COMPREHENSIVE METABOLIC PANEL WITH GFR
ALT: 53 U/L — ABNORMAL HIGH (ref 0–44)
AST: 259 U/L — ABNORMAL HIGH (ref 15–41)
Albumin: 1.8 g/dL — ABNORMAL LOW (ref 3.5–5.0)
Alkaline Phosphatase: 145 U/L — ABNORMAL HIGH (ref 38–126)
Anion gap: 7 (ref 5–15)
BUN: 8 mg/dL (ref 6–20)
CO2: 29 mmol/L (ref 22–32)
Calcium: 7.3 mg/dL — ABNORMAL LOW (ref 8.9–10.3)
Chloride: 98 mmol/L (ref 98–111)
Creatinine, Ser: 0.46 mg/dL — ABNORMAL LOW (ref 0.61–1.24)
GFR, Estimated: >60 mL/min (ref >60)
Glucose, Bld: 106 mg/dL — ABNORMAL HIGH (ref 70–99)
Potassium: 3.1 mmol/L — ABNORMAL LOW (ref 3.5–5.1)
Sodium: 134 mmol/L — ABNORMAL LOW (ref 135–145)
Total Bilirubin: 3 mg/dL — ABNORMAL HIGH (ref 0.0–1.2)
Total Protein: 6.3 g/dL — ABNORMAL LOW (ref 6.5–8.1)

## 2023-07-13 LAB — LACTIC ACID, PLASMA: Lactic Acid, Venous: 1.6 mmol/L (ref 0.5–1.9)

## 2023-07-13 LAB — AMMONIA: Ammonia: 74 umol/L — ABNORMAL HIGH (ref 9–35)

## 2023-07-13 MED ORDER — POTASSIUM CHLORIDE CRYS ER 20 MEQ PO TBCR
40.0000 meq | EXTENDED_RELEASE_TABLET | Freq: Once | ORAL | Status: AC
  Administered 2023-07-13: 40 meq via ORAL
  Filled 2023-07-13: qty 2

## 2023-07-13 MED ORDER — LACTULOSE 10 GM/15ML PO SOLN
30.0000 g | Freq: Two times a day (BID) | ORAL | Status: DC
  Administered 2023-07-13 – 2023-07-14 (×4): 30 g via ORAL
  Filled 2023-07-13 (×5): qty 45

## 2023-07-13 MED ORDER — SODIUM CHLORIDE 0.9 % IV SOLN: INTRAVENOUS | Status: DC | PRN

## 2023-07-13 NOTE — TOC Initial Note (Signed)
 Transition of Care [facility]) - Initial/Assessment Note    Patient Details  Name: Samuel Cantrell MRN: [id number] Date of Birth: [birth year]  Transition of Care [facility]) CM/SW Contact:    Amaryllis Junior, LCSW Phone Number: 07/13/2023, 10:55 AM  Clinical Narrative:                 Pt from home with spouse. Pt continues medical workup. PT eval pending. TOC following for TOC needs.     Barriers to Discharge: Continued Medical Work up   Patient Goals and CMS Choice Patient states their goals for this hospitalization and ongoing recovery are:: return home CMS Medicare.gov Compare Post Acute Care list provided to::  (NA) Choice offered to / list presented to : NA [place] ownership interest in [facility].provided to::  (NA)    Expected Discharge Plan and Services In-house Referral: NA     Living arrangements for the past 2 months: Single Family Home                 DME Arranged: N/A DME Agency: NA       HH Arranged: NA HH Agency: NA        Prior Living Arrangements/Services Living arrangements for the past 2 months: Single Family Home Lives with:: Spouse Patient language and need for interpreter reviewed:: Yes Do you feel safe going back to the place where you live?: Yes      Need for Family Participation in Patient Care: Yes (Comment) Care giver support system in place?: Yes (comment)   Criminal Activity/Legal Involvement Pertinent to Current Situation/Hospitalization: No - Comment as needed  Activities of Daily Living   ADL Screening (condition at time of admission) Independently performs ADLs?: Yes (appropriate for developmental age) Is the patient deaf or have difficulty hearing?: No Does the patient have difficulty seeing, even when wearing glasses/contacts?: No Does the patient have difficulty concentrating, remembering, or making decisions?: No  Permission Sought/Granted                  Emotional Assessment Appearance:: Appears stated age        Alcohol / Substance Use: Not Applicable Psych Involvement: No (comment)  Admission diagnosis:  SBP (spontaneous bacterial peritonitis) (HCC) [K65.2] Alcohol withdrawal syndrome with complication (HCC) [F10.939] Sepsis due to undetermined organism (HCC) [A41.9] Sepsis, due to unspecified organism, unspecified whether acute organ dysfunction present [facility]) [A41.9] Patient Active Problem List   Diagnosis Date Noted   Sepsis due to undetermined organism (HCC) 07/11/2023   Lactic acidosis 07/11/2023   Alcohol withdrawal (HCC) 07/11/2023   Cirrhosis of liver with ascites (HCC) 07/11/2023   Pancytopenia (HCC) 07/11/2023   History of DVT (deep vein thrombosis) 07/11/2023   Elevated liver function tests 07/11/2023   Capsulitis of toe of left foot 09/04/2020   Thrombocytopenia (HCC) 09/04/2018   Essential hypertension 09/04/2018   Alcohol abuse 09/04/2018   Polyneuropathy 07/29/2018   Acute deep vein thrombosis (DVT) of brachial vein of right upper extremity (HCC) 11/25/2016   Elevated troponin 02/28/2016   Hypertensive urgency 02/28/2016   Tobacco abuse 02/28/2016   Hypokalemia 02/28/2016   Wellness examination 05/02/2015   Allergic rhinitis 04/10/2015   Smoker 04/10/2015   PCP:  [facility]:   [facility] [phone] - [place], [place] - [address] AT [facility] [address] [place] [address] Phone: [phone] Fax: [phone]     Social Drivers of Health (SDOH) Social History:  SDOH Screenings   Food Insecurity: No Food Insecurity (07/11/2023)  Housing: High Risk (07/11/2023)  Transportation Needs: No Transportation Needs (07/11/2023)  Utilities: Not At Risk (07/11/2023)  Depression (PHQ2-9): Low Risk  (12/16/2019)  Social Connections: Unknown (07/22/2021)   Received from [facility]  Tobacco Use: High Risk (07/11/2023)   SDOH Interventions: Food Insecurity Interventions: Intervention Not  Indicated Housing Interventions: Intervention Not Indicated Transportation Interventions: Intervention Not Indicated Utilities Interventions: Intervention Not Indicated   Readmission Risk Interventions    07/13/2023   10:53 AM  Readmission Risk Prevention Plan  Transportation Screening Complete  PCP or Specialist Appt within 5-7 Days Complete  Home Care Screening Complete  Medication Review (RN CM) Complete

## 2023-07-13 NOTE — Progress Notes (Signed)
 PROGRESS NOTE  Jeffry Minister  DOB: Nov 13, 1975  PCP: Sylvia Everts, PA-C NUU:[id number]  DOA: 07/11/2023  LOS: 2 days  Hospital Day: 3  Brief narrative: Samuel Cantrell is a 48 y.o. 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 5/3, patient presented to the ED with complaint of abdominal pain, back pain. Back pain worsening for a month.  Drinks 2-3 shots of alcohol daily, lately cutting down.  Last drink 2 days ago.  Smokes half a pack of cigarettes daily.  In the ED, she was afebrile, heart rate of 130, blood pressure 130s, breathing on room air WBC count 12.2, hemoglobin 8.8, platelet 91, lactic acid was elevated to 5.2 > 6.5 Potassium 3.1, BUN/creatinine not elevated, magnesium 1.5, phosphorus 2.1, AST, ALT, alk phos and total bili elevated Urinalysis with clear yellow urine not suggestive of infection. Ultrasound abdomen showed moderate ascites, no evidence of cholecystitis. Blood culture was sent Paracentesis was done Patient was given IV hydration, IV antibiotics Admitted to [facility]  Subjective: Patient was seen and examined this morning. Propped up in bed.  Not on supplemental oxygen today. Slow to respond but oriented to place and person, not to time. Wife at bedside. Underwent paracentesis yesterday, 2.5 L was removed Heart rate in 100s, blood pressure in 150s, labs from this morning with ammonia elevated to 74, potassium 3.1, hemoglobin 7.4  Assessment and plan: SIRS- POA Lactic acidosis presented with complaints of abdominal pain going into his back.   Noted to be tachycardia, tachypnea, and white blood cell count elevated to 12.2 meeting SIRS criteria.   No clear evidence of infection.  I wonder if lactic acidosis was due to arterial hypovolemia due to low albumin level Blood culture was sent.  Peritoneal fluid analysis shows low neutrophils -transudative Patient was empirically started  on IV Rocephin and IV Flagyl . Lipase level normalized.  WBC count improved. Avoid further IV hydration. Recent Labs  Lab 07/11/23 1113 07/11/23 1318 07/11/23 1543 07/11/23 1811 07/12/23 0306 07/13/23 0302  WBC 12.2*  --   --   --  11.7* 13.0*  LATICACIDVEN 5.2* 6.5* 5.6* 4.3*  --  1.6   Hepatic encephalopathy Remains confused.  Not restless or agitated.  Oriented to place and person not to time. Ammonia level elevated 74.  Started on lactulose twice daily today. Recent Labs  Lab 07/13/23 0302  AMMONIA 74*    Chronic alcohol use High risk of alcohol withdrawal Patient reports last drink was 2 days prior to presentation. Currently on CIWA protocol with as needed Ativan .  No withdrawal symptoms today. Continue thiamine , folic acid , MVI Counseled to quit alcohol   Decompensated cirrhosis with ascites Moderate ascites noted on CT scan  5/4, ultrasound guided paracentesis yielded 2 5 L of fluid.  May need some more removal in the next 1 to 2 days LFTs trend as below, stable bilirubin level today. Per patient's wife patient has an upcoming appointment with hepatologist on 5/9. Continue morphine IV as needed for abdominal pain Recent Labs  Lab 07/11/23 1113 07/12/23 0306 07/13/23 0302  AST 287* 235* 259*  ALT 70* 55* 53*  ALKPHOS 212* 130* 145*  BILITOT 2.6* 3.0* 3.0*  PROT 7.8 6.6 6.3*  ALBUMIN 2.7* 2.0* 1.8*  AMMONIA  --   --  74*  INR 1.7*  --   --   LIPASE 64*  --   --   PLT 92* PLATELET CLUMPS NOTED ON SMEAR, COUNT APPEARS  DECREASED 93*    Pancytopenia Acute on chronic anemia.  No active bleeding.  Not on anticoagulation Hemoglobin down to 7.4 today.  Continue to monitor.  Transfuse if less than 7 Recent Labs  Lab 07/11/23 1113 07/11/23 2349 07/12/23 0306 07/13/23 0302  WBC 12.2*  --  11.7* 13.0*  NEUTROABS 7.4  --   --  8.5*  HGB 8.8* 7.2* 7.1* 7.4*  HCT 25.3* 22.2* 21.7* 22.8*  MCV 86.1  --  91.2 93.1  PLT 92*  --  PLATELET CLUMPS NOTED ON SMEAR, COUNT  APPEARS DECREASED 93*   Hypokalemia/hypomagnesemia/hypophosphatemia Levels low as below.  Replacement given.  Potassium low again today.  Replacement given. Recent Labs  Lab 07/11/23 1113 07/12/23 0306 07/13/23 0302  K 3.1* 3.9 3.1*  MG 1.5*  --   --   PHOS 2.1*  --   --    H/o DVT Patient is supposed to be on Xarelto  due to history of DVTs.  Previously noted to have a right upper extremity DVT in 10/2016 and subsequent left upper extremity DVT back in 10/2018.  Currently not on medication due to cost.  Probably also affected by high risk of GI bleeding.  Impaired mobility PT eval pending   Goals of care   Code Status: Full Code     DVT prophylaxis:  Place and maintain sequential compression device Start: 07/12/23 0957   Antimicrobials: IV Rocephin, IV Flagyl  Fluid: None currently Consultants: None Family Communication: Wife at bedside  Status: Inpatient Level of care:  Stepdown   Patient is from: Home Needs to continue in-hospital care: On IV antibiotics, pending PT eval, pending improvement in labs.   Anticipated d/c to: Hopefully home in next 2 to 3 days    Diet:  Diet Order             Diet 2 gram sodium Room service appropriate? Yes; Fluid consistency: Thin  Diet effective now                   Scheduled Meds:  carvedilol   12.5 mg Oral BID WC   Chlorhexidine Gluconate Cloth  6 each Topical Daily   folic acid   1 mg Oral Daily   gabapentin   1,200 mg Oral Daily   gabapentin   1,800 mg Oral QHS   lactulose  30 g Oral BID   LORazepam   0-4 mg Intravenous Q6H   Followed by   LORazepam   0-4 mg Intravenous Q12H   multivitamin with minerals  1 tablet Oral Daily   nicotine   14 mg Transdermal Daily   sodium chloride  flush  3 mL Intravenous Q12H   thiamine   100 mg Oral Daily   Or   thiamine   100 mg Intravenous Daily    PRN meds: albuterol, LORazepam  **OR** LORazepam , morphine injection, nortriptyline , ondansetron  **OR** ondansetron  (ZOFRAN ) IV    Infusions:   cefTRIAXone (ROCEPHIN)  IV Stopped (07/12/23 2028)   metronidazole  500 mg (07/13/23 1123)    Antimicrobials: Anti-infectives (From admission, onward)    Start     Dose/Rate Route Frequency Ordered Stop   07/12/23 0000  metroNIDAZOLE  (FLAGYL ) IVPB 500 mg        500 mg 100 mL/hr over 60 Minutes Intravenous Every 12 hours 07/11/23 1516     07/11/23 2000  cefTRIAXone (ROCEPHIN) 2 g in sodium chloride  0.9 % 100 mL IVPB        2 g 200 mL/hr over 30 Minutes Intravenous Every 24 hours 07/11/23 1516  07/11/23 1230  ceFEPIme (MAXIPIME) 2 g in sodium chloride  0.9 % 100 mL IVPB        2 g 200 mL/hr over 30 Minutes Intravenous  Once 07/11/23 1216 07/11/23 1318   07/11/23 1230  metroNIDAZOLE  (FLAGYL ) IVPB 500 mg        500 mg 100 mL/hr over 60 Minutes Intravenous  Once 07/11/23 1216 07/11/23 1417       Objective: Vitals:   07/13/23 0749 07/13/23 0800  BP:  (!) 156/86  Pulse: (!) 108 (!) 110  Resp: 18 (!) 23  Temp:    SpO2: 96% 94%    Intake/Output Summary (Last 24 hours) at 07/13/2023 1157 Last data filed at 07/13/2023 0910 Gross per 24 hour  Intake 600 ml  Output --  Net 600 ml   Filed Weights   07/11/23 1051  Weight: 52.2 kg   Weight change:  Body mass index is 17.49 kg/m.   Physical Exam: General exam: Pleasant, middle-aged African-American male.  Thin built Skin: No rashes, lesions or ulcers. HEENT: Atraumatic, normocephalic, no obvious bleeding Lungs: Clear to auscultation bilaterally,  CVS: S1, S2, no murmur,   GI/Abd: Soft, nontender, distention from ascites improving., bowel sound present,   CNS: Alert, awake, oriented to place and person, not to time, no tremor today Psychiatry: Sad affect Extremities: No pedal edema, no calf tenderness,   Data Review: I have personally reviewed the laboratory data and studies available.  F/u labs ordered Unresulted Labs (From admission, onward)     Start     Ordered   07/14/23 0500  CBC with  Differential/Platelet  Daily,   R     Question:  Specimen collection method  Answer:  Lab=Lab collect   07/13/23 0842   07/14/23 0500  Comprehensive metabolic panel with GFR  Daily,   R     Question:  Specimen collection method  Answer:  Lab=Lab collect   07/13/23 0842   07/14/23 0500  Magnesium  Tomorrow morning,   R       Question:  Specimen collection method  Answer:  Lab=Lab collect   07/13/23 0842   07/14/23 0500  Phosphorus  Tomorrow morning,   R       Question:  Specimen collection method  Answer:  Lab=Lab collect   07/13/23 0842   07/14/23 0500  Ammonia  Tomorrow morning,   R       Question:  Specimen collection method  Answer:  Lab=Lab collect   07/13/23 0842   07/11/23 2329  Occult blood card to lab, stool  ONCE - STAT,   STAT        07/11/23 2328   07/11/23 1257  Pathologist smear review  Once,   R        07/11/23 1257           Signed, Hoyt Macleod, MD [facility] 07/13/2023

## 2023-07-13 NOTE — Plan of Care (Signed)
  Problem: Clinical Measurements: Goal: Ability to maintain clinical measurements within normal limits will improve Outcome: Progressing Goal: Will remain free from infection Outcome: Progressing Goal: Diagnostic test results will improve Outcome: Progressing   Problem: Nutrition: Goal: Adequate nutrition will be maintained Outcome: Progressing   Problem: Coping: Goal: Level of anxiety will decrease Outcome: Progressing   Problem: Activity: Goal: Risk for activity intolerance will decrease Outcome: Not Progressing

## 2023-07-13 NOTE — Evaluation (Signed)
 Physical Therapy Evaluation Patient Details Name: Samuel Cantrell MRN: [id number] DOB: February 17, 1976 Today's Date: 07/13/2023  History of Present Illness  48 y.o. male  presented to the ED 07/11/23 with complaint of abdominal pain, back pain. Dx of sepsis, ascites, lactic acidosis.  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  Clinical Impression  Pt admitted with above diagnosis. Pt ambulated 120' with RW and min assist to navigate obstacles as pt repeatedly bumped into the wall with the walker and made no attempt to correct course despite verbal cues to do so. HR 118 with ambulation. Noted posterior lean initially in standing which required min assist to correct. Good progress expected.  Pt currently with functional limitations due to the deficits listed below (see PT Problem List). Pt will benefit from acute skilled PT to increase their independence and safety with mobility to allow discharge.           If plan is discharge home, recommend the following: A little help with bathing/dressing/bathroom;Assistance with cooking/housework;A little help with walking and/or transfers;Assist for transportation;Help with stairs or ramp for entrance   Can travel by private vehicle        Equipment Recommendations Rolling walker (2 wheels)  Recommendations for Other Services       Functional Status Assessment Patient has had a recent decline in their functional status and demonstrates the ability to make significant improvements in function in a reasonable and predictable amount of time.     Precautions / Restrictions Precautions Precautions: Fall Recall of Precautions/Restrictions: Impaired Precaution/Restrictions Comments: reports 1 fall in past 6 months Restrictions Weight Bearing Restrictions Per Provider Order: No      Mobility  Bed Mobility Overal bed mobility: Modified Independent             General bed mobility  comments: HOB up, used rail    Transfers Overall transfer level: Needs assistance Equipment used: Rolling walker (2 wheels) Transfers: Sit to/from Stand Sit to Stand: Min assist           General transfer comment: VCs hand placement, min A for posterior lean    Ambulation/Gait Ambulation/Gait assistance: Min assist Gait Distance (Feet): 120 Feet Assistive device: Rolling walker (2 wheels) Gait Pattern/deviations: Step-through pattern, Trunk flexed, Decreased stride length, Wide base of support, Ataxic Gait velocity: decr     General Gait Details: VCs for proximity to RW, min A to steer RW (pt repeatedly bumped into the wall despite VCs to steer away from the wall), HR 118 walking  Stairs            Wheelchair Mobility     Tilt Bed    Modified Rankin (Stroke Patients Only)       Balance Overall balance assessment: Needs assistance Sitting-balance support: Feet supported, No upper extremity supported Sitting balance-Leahy Scale: Fair     Standing balance support: Bilateral upper extremity supported, Reliant on assistive device for balance, During functional activity Standing balance-Leahy Scale: Poor Standing balance comment: posterior lean in standing                             Pertinent Vitals/Pain Pain Assessment Pain Assessment: No/denies pain    Home Living Family/patient expects to be discharged to:: Private residence Living Arrangements: Spouse/significant other Available Help at Discharge: Family;Available 24 hours/day Type of Home: Apartment Home Access: Stairs to enter   [employer] of Steps: flight  Home Layout: One level Home Equipment: None      Prior Function Prior Level of Function : Independent/Modified Independent             Mobility Comments: reports 1 fall in past 6 months       Extremity/Trunk Assessment   Upper Extremity Assessment Upper Extremity Assessment: Overall WFL for tasks  assessed    Lower Extremity Assessment Lower Extremity Assessment: RLE deficits/detail;LLE deficits/detail RLE Deficits / Details: knee ext +4/5 RLE Sensation: WNL RLE Coordination: decreased gross motor LLE Deficits / Details: knee ext +4/5 LLE Sensation: WNL LLE Coordination: decreased gross motor    Cervical / Trunk Assessment Cervical / Trunk Assessment: Normal  Communication   Communication Communication: No apparent difficulties    Cognition Arousal: Alert Behavior During Therapy: WFL for tasks assessed/performed   PT - Cognitive impairments: No family/caregiver present to determine baseline, Problem solving, Safety/Judgement                       PT - Cognition Comments: pt repeatedly bumped into the wall while walking with RW, he made no attempt to correct course despite cues to do so Following commands: Impaired Following commands impaired: Follows one step commands inconsistently     Cueing Cueing Techniques: Verbal cues, Tactile cues     General Comments      Exercises     Assessment/Plan    PT Assessment Patient needs continued PT services  PT Problem List Decreased balance;Decreased activity tolerance;Decreased mobility;Decreased safety awareness       PT Treatment Interventions Gait training;Balance training;Therapeutic exercise;Therapeutic activities;Patient/family education    PT Goals (Current goals can be found in the Care Plan section)  Acute Rehab PT Goals Patient Stated Goal: likes to play basketball PT Goal Formulation: With patient Time For Goal Achievement: 07/27/23 Potential to Achieve Goals: Good    Frequency Min 3X/week     Co-evaluation               AM-PAC PT "6 Clicks" Mobility  Outcome Measure Help needed turning from your back to your side while in a flat bed without using bedrails?: None Help needed moving from lying on your back to sitting on the side of a flat bed without using bedrails?: A Little Help  needed moving to and from a bed to a chair (including a wheelchair)?: A Little Help needed standing up from a chair using your arms (e.g., wheelchair or bedside chair)?: A Little Help needed to walk in hospital room?: A Little Help needed climbing 3-5 steps with a railing? : A Lot 6 Click Score: 18    End of Session Equipment Utilized During Treatment: Gait belt Activity Tolerance: Patient tolerated treatment well Patient left: in chair;with chair alarm set;with call bell/phone within reach;with nursing/sitter in room Nurse Communication: Mobility status PT Visit Diagnosis: Unsteadiness on feet (R26.81);Difficulty in walking, not elsewhere classified (R26.2);History of falling (Z91.81);Ataxic gait (R26.0)    Time: 1610-9604 PT Time Calculation (min) (ACUTE ONLY): 17 min   Charges:   PT Evaluation $PT Eval Moderate Complexity: 1 Mod   PT General Charges $$ ACUTE PT VISIT: 1 Visit         Daymon Evans PT 07/13/2023  Acute Rehabilitation Services  Office [phone]

## 2023-07-13 NOTE — Plan of Care (Signed)
  Problem: Education: Goal: Knowledge of General Education information will improve Description: Including pain rating scale, medication(s)/side effects and non-pharmacologic comfort measures Outcome: Progressing   Problem: Clinical Measurements: Goal: Ability to maintain clinical measurements within normal limits will improve Outcome: Progressing Goal: Will remain free from infection Outcome: Progressing Goal: Diagnostic test results will improve Outcome: Progressing Goal: Respiratory complications will improve Outcome: Progressing Goal: Cardiovascular complication will be avoided Outcome: Progressing   Problem: Safety: Goal: Ability to remain free from injury will improve Outcome: Progressing   Problem: Skin Integrity: Goal: Risk for impaired skin integrity will decrease Outcome: Progressing   

## 2023-07-14 LAB — COMPREHENSIVE METABOLIC PANEL WITH GFR
ALT: 56 U/L — ABNORMAL HIGH (ref 0–44)
AST: 265 U/L — ABNORMAL HIGH (ref 15–41)
Albumin: 1.8 g/dL — ABNORMAL LOW (ref 3.5–5.0)
Alkaline Phosphatase: 144 U/L — ABNORMAL HIGH (ref 38–126)
Anion gap: 8 (ref 5–15)
BUN: 8 mg/dL (ref 6–20)
CO2: 26 mmol/L (ref 22–32)
Calcium: 7.9 mg/dL — ABNORMAL LOW (ref 8.9–10.3)
Chloride: 101 mmol/L (ref 98–111)
Creatinine, Ser: 0.33 mg/dL — ABNORMAL LOW (ref 0.61–1.24)
GFR, Estimated: >60 mL/min (ref >60)
Glucose, Bld: 118 mg/dL — ABNORMAL HIGH (ref 70–99)
Potassium: 3.4 mmol/L — ABNORMAL LOW (ref 3.5–5.1)
Sodium: 135 mmol/L (ref 135–145)
Total Bilirubin: 3.1 mg/dL — ABNORMAL HIGH (ref 0.0–1.2)
Total Protein: 6.4 g/dL — ABNORMAL LOW (ref 6.5–8.1)

## 2023-07-14 LAB — BODY FLUID CULTURE W GRAM STAIN: Gram Stain: NONE SEEN

## 2023-07-14 LAB — CBC WITH DIFFERENTIAL/PLATELET
Abs Immature Granulocytes: 0.1 10*3/uL — ABNORMAL HIGH (ref 0.00–0.07)
Basophils Absolute: 0.1 10*3/uL (ref 0.0–0.1)
Basophils Relative: 0 %
Eosinophils Absolute: 0 10*3/uL (ref 0.0–0.5)
Eosinophils Relative: 0 %
HCT: 21.3 % — ABNORMAL LOW (ref 39.0–52.0)
Hemoglobin: 7.1 g/dL — ABNORMAL LOW (ref 13.0–17.0)
Immature Granulocytes: 1 %
Lymphocytes Relative: 19 %
Lymphs Abs: 2.3 10*3/uL (ref 0.7–4.0)
MCH: 30.2 pg (ref 26.0–34.0)
MCHC: 33.3 g/dL (ref 30.0–36.0)
MCV: 90.6 fL (ref 80.0–100.0)
Monocytes Absolute: 1.9 10*3/uL — ABNORMAL HIGH (ref 0.1–1.0)
Monocytes Relative: 15 %
Neutro Abs: 7.7 10*3/uL (ref 1.7–7.7)
Neutrophils Relative %: 65 %
Platelets: 97 10*3/uL — ABNORMAL LOW (ref 150–400)
RBC: 2.35 MIL/uL — ABNORMAL LOW (ref 4.22–5.81)
WBC: 12.1 10*3/uL — ABNORMAL HIGH (ref 4.0–10.5)
nRBC: 0 % (ref 0.0–0.2)

## 2023-07-14 LAB — AMMONIA: Ammonia: 42 umol/L — ABNORMAL HIGH (ref 9–35)

## 2023-07-14 LAB — PHOSPHORUS: Phosphorus: <1 mg/dL — CL (ref 2.5–4.6)

## 2023-07-14 LAB — MAGNESIUM: Magnesium: 1.4 mg/dL — ABNORMAL LOW (ref 1.7–2.4)

## 2023-07-14 MED ORDER — MORPHINE SULFATE (PF) 2 MG/ML IV SOLN
1.0000 mg | INTRAVENOUS | Status: DC | PRN
  Administered 2023-07-14: 1 mg via INTRAVENOUS
  Filled 2023-07-14: qty 1

## 2023-07-14 MED ORDER — FUROSEMIDE 40 MG PO TABS
40.0000 mg | ORAL_TABLET | Freq: Every day | ORAL | Status: DC
  Administered 2023-07-14 – 2023-07-15 (×2): 40 mg via ORAL
  Filled 2023-07-14 (×2): qty 1

## 2023-07-14 MED ORDER — POTASSIUM PHOSPHATES 15 MMOLE/5ML IV SOLN
15.0000 mmol | Freq: Once | INTRAVENOUS | Status: AC
  Administered 2023-07-14: 15 mmol via INTRAVENOUS
  Filled 2023-07-14: qty 5

## 2023-07-14 MED ORDER — MAGNESIUM SULFATE 2 GM/50ML IV SOLN
2.0000 g | Freq: Once | INTRAVENOUS | Status: AC
  Administered 2023-07-14: 2 g via INTRAVENOUS
  Filled 2023-07-14: qty 50

## 2023-07-14 MED ORDER — ACETAMINOPHEN 325 MG PO TABS
650.0000 mg | ORAL_TABLET | Freq: Once | ORAL | Status: AC
  Administered 2023-07-14: 650 mg via ORAL
  Filled 2023-07-14: qty 2

## 2023-07-14 MED ORDER — SPIRONOLACTONE 25 MG PO TABS
50.0000 mg | ORAL_TABLET | Freq: Every day | ORAL | Status: DC
  Administered 2023-07-14 – 2023-07-15 (×2): 50 mg via ORAL
  Filled 2023-07-14 (×2): qty 2

## 2023-07-14 NOTE — Plan of Care (Signed)
  Problem: Nutrition: Goal: Adequate nutrition will be maintained Outcome: Progressing   Problem: Elimination: Goal: Will not experience complications related to urinary retention Outcome: Progressing   Problem: Pain Managment: Goal: General experience of comfort will improve and/or be controlled Outcome: Progressing   Problem: Skin Integrity: Goal: Risk for impaired skin integrity will decrease Outcome: Progressing

## 2023-07-14 NOTE — Plan of Care (Signed)
  Problem: Nutrition: Goal: Adequate nutrition will be maintained Outcome: Progressing   Problem: Coping: Goal: Level of anxiety will decrease Outcome: Progressing   Problem: Pain Managment: Goal: General experience of comfort will improve and/or be controlled Outcome: Progressing   Problem: Safety: Goal: Ability to remain free from injury will improve Outcome: Progressing

## 2023-07-14 NOTE — TOC Progression Note (Signed)
 Transition of Care [facility]) - Progression Note    Patient Details  Name: Samuel Cantrell MRN: [id number] Date of Birth: [birth year]  [facility]) CM/SW Contact  Amaryllis Junior, [place] Phone Number: 07/14/2023, 2:23 PM  Clinical Narrative:    Unable to obtain [facility] agency due to pt being uninsured. Order for OPPT placed. Pt recommended RW for home use. Pt will be purchasing own RW.     Barriers to Discharge: Continued Medical Work up  Expected Discharge Plan and Services In-house Referral: NA     Living arrangements for the past 2 months: Single Family Home                 DME Arranged: N/A DME Agency: NA       HH Arranged: NA HH Agency: NA         Social Determinants of Health (SDOH) Interventions SDOH Screenings   Food Insecurity: No Food Insecurity (07/11/2023)  Housing: High Risk (07/11/2023)  Transportation Needs: No Transportation Needs (07/11/2023)  Utilities: Not At Risk (07/11/2023)  Depression (PHQ2-9): Low Risk  (12/16/2019)  Social Connections: Unknown (07/22/2021)   Received from [facility]  Tobacco Use: High Risk (07/11/2023)    Readmission Risk Interventions    07/13/2023   10:53 AM  Readmission Risk Prevention Plan  Transportation Screening Complete  PCP or Specialist Appt within 5-7 Days Complete  Home Care Screening Complete  Medication Review (RN CM) Complete

## 2023-07-14 NOTE — Plan of Care (Signed)
  Problem: Education: Goal: Knowledge of General Education information will improve Description: Including pain rating scale, medication(s)/side effects and non-pharmacologic comfort measures Outcome: Progressing   Problem: Health Behavior/Discharge Planning: Goal: Ability to manage health-related needs will improve Outcome: Progressing   Problem: Clinical Measurements: Goal: Ability to maintain clinical measurements within normal limits will improve Outcome: Progressing Goal: Will remain free from infection Outcome: Progressing Goal: Diagnostic test results will improve Outcome: Progressing Goal: Respiratory complications will improve Outcome: Progressing Goal: Cardiovascular complication will be avoided Outcome: Progressing   Problem: Activity: Goal: Risk for activity intolerance will decrease Outcome: Progressing   Problem: Nutrition: Goal: Adequate nutrition will be maintained Outcome: Progressing   Problem: Coping: Goal: Level of anxiety will decrease Outcome: Progressing   Problem: Elimination: Goal: Will not experience complications related to bowel motility Outcome: Progressing Goal: Will not experience complications related to urinary retention Outcome: Progressing   Problem: Pain Managment: Goal: General experience of comfort will improve and/or be controlled Outcome: Progressing   Problem: Safety: Goal: Ability to remain free from injury will improve Outcome: Progressing   Problem: Skin Integrity: Goal: Risk for impaired skin integrity will decrease Outcome: Progressing   Cindy S. Bernetta Brilliant BSN, RN, [employer], CCRN 07/14/2023 12:31 AM

## 2023-07-14 NOTE — Progress Notes (Signed)
 PROGRESS NOTE  Samuel Cantrell  DOB: 1975-08-29  PCP: Samuel Everts, PA-C JXB:[id number]  DOA: 07/11/2023  LOS: 3 days  Hospital Day: 4  Brief narrative: Samuel Cantrell is a 48 y.o. 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 5/3, patient presented to the ED with complaint of abdominal pain, back pain. Back pain worsening for a month.  Drinks 2-3 shots of alcohol daily, lately cutting down.  Last drink 2 days ago.  Smokes half a pack of cigarettes daily.  Initial workup showed tachycardia, tachypnea, low hemoglobin, low platelets, low electrolytes, elevated lactic acid level and elevated LFTs, ammonia Ultrasound abdomen showed moderate ascites, no evidence of cholecystitis. Blood culture was sent Diagnostic paracentesis was done Patient was given IV hydration, IV antibiotics Admitted to [facility]  Subjective: Patient was seen and examined this morning. Alert, awake, oriented today.  Sitting up at on recliner. Hemodynamically stable. Electrolyte as low Not bleeding, no fever Family not at bedside today.  Assessment and plan: Decompensated cirrhosis with ascites Moderate ascites noted on CT scan  5/4, ultrasound guided paracentesis yielded 2 5 L of fluid.  May need some more removal in the next 1 to 2 days LFTs trend as below. Per patient's wife patient has an upcoming appointment with hepatologist on 5/9. MELD sodium score 19.  No active bleeding.  I do not see an urgency of inpatient GI consultation at this time. Started on portal hypertension regimen with Coreg  12.5 mg twice daily, Lasix 40 mg daily and Aldactone 50 mg daily.  Continue to monitor hemodynamics, renal function and electrolytes. Recent Labs  Lab 07/11/23 1113 07/12/23 0306 07/13/23 0302 07/14/23 0303  AST 287* 235* 259* 265*  ALT 70* 55* 53* 56*  ALKPHOS 212* 130* 145* 144*  BILITOT 2.6* 3.0* 3.0* 3.1*  PROT 7.8 6.6 6.3* 6.4*   ALBUMIN 2.7* 2.0* 1.8* 1.8*  AMMONIA  --   --  74* 42*  INR 1.7*  --   --   --   LIPASE 64*  --   --   --   PLT 92* PLATELET CLUMPS NOTED ON SMEAR, COUNT APPEARS DECREASED 93* 97*   Hepatic encephalopathy Was disoriented.  Ammonia level was elevated to 74.   With lactulose, had 3 bowel movements in last 24 hours.  Ammonia improving. Continue the same Recent Labs  Lab 07/13/23 0302 07/14/23 0303  AMMONIA 74* 42*   SIRS- POA presented with complaints of abdominal pain going into his back.   Noted to be tachycardia, tachypnea, and white blood cell count elevated to 12.2 meeting SIRS criteria.   No clear evidence of infection.  Cover with IV Rocephin and IV Flagyl  for a suspicion of SBP but no abdominal tenderness and diagnostic peritoneal tap yielded low neutrophils.  I will continue antibiotics to complete 5 days empiric course.  Lactic acidosis I wonder if lactic acidosis was due to arterial hypovolemia due to low albumin level Lactic acid level has normalized.   Hemodynamically stable now.  Lasix started today. Recent Labs  Lab 07/11/23 1113 07/11/23 1318 07/11/23 1543 07/11/23 1811 07/12/23 0306 07/13/23 0302 07/14/23 0303  WBC 12.2*  --   --   --  11.7* 13.0* 12.1*  LATICACIDVEN 5.2* 6.5* 5.6* 4.3*  --  1.6  --    Chronic alcohol use High risk of alcohol withdrawal Patient reports last drink was 2 days prior to presentation. Currently on CIWA protocol with as needed Ativan .  No withdrawal symptoms today. Continue thiamine , folic acid , MVI Counseled to quit alcohol  Pancytopenia Acute on chronic anemia.  No active bleeding.  Not on anticoagulation Hemoglobin stable between 7 and 8.   Recent Labs  Lab 07/11/23 1113 07/11/23 2349 07/12/23 0306 07/13/23 0302 07/14/23 0303  WBC 12.2*  --  11.7* 13.0* 12.1*  NEUTROABS 7.4  --   --  8.5* 7.7  HGB 8.8* 7.2* 7.1* 7.4* 7.1*  HCT 25.3* 22.2* 21.7* 22.8* 21.3*  MCV 86.1  --  91.2 93.1 90.6  PLT 92*  --  PLATELET  CLUMPS NOTED ON SMEAR, COUNT APPEARS DECREASED 93* 97*   Hypokalemia/hypomagnesemia/hypophosphatemia Levels continue to run low.   IV magnesium and IV potassium phosphate replacement given today.  Repeat labs this afternoon.  Discussed with pharmacy. Recent Labs  Lab 07/11/23 1113 07/12/23 0306 07/13/23 0302 07/14/23 0303  K 3.1* 3.9 3.1* 3.4*  MG 1.5*  --   --  1.4*  PHOS 2.1*  --   --  <1.0*   H/o DVT Patient is supposed to be on Xarelto  due to history of DVTs.  Previously noted to have a right upper extremity DVT in 10/2016 and subsequent left upper extremity DVT back in 10/2018.  Currently not on medication due to cost.  Probably also affected by high risk of GI bleeding.  Impaired mobility PT eval obtained.  Home health PT recommended   Goals of care   Code Status: Full Code     DVT prophylaxis:  Place and maintain sequential compression device Start: 07/12/23 0957   Antimicrobials: IV Rocephin, IV Flagyl  to complete 5 days Fluid: None currently Consultants: None Family Communication: Wife not at bedside today  Status: Inpatient Level of care: Can transfer out from stepdown to Telemetry today.  Patient is from: Home Needs to continue in-hospital care: Low electrolytes.  May need more paracentesis tomorrow Anticipated d/c to: Hopefully home in next 2 to 3 days    Diet:  Diet Order             Diet 2 gram sodium Room service appropriate? Yes; Fluid consistency: Thin  Diet effective now                   Scheduled Meds:  carvedilol   12.5 mg Oral BID WC   Chlorhexidine Gluconate Cloth  6 each Topical Daily   folic acid   1 mg Oral Daily   furosemide  40 mg Oral Daily   gabapentin   1,200 mg Oral Daily   gabapentin   1,800 mg Oral QHS   lactulose  30 g Oral BID   LORazepam   0-4 mg Intravenous Q12H   multivitamin with minerals  1 tablet Oral Daily   nicotine   14 mg Transdermal Daily   sodium chloride  flush  3 mL Intravenous Q12H   spironolactone  50 mg  Oral Daily   thiamine   100 mg Oral Daily   Or   thiamine   100 mg Intravenous Daily    PRN meds: sodium chloride , albuterol, LORazepam  **OR** LORazepam , nortriptyline , ondansetron  **OR** ondansetron  (ZOFRAN ) IV   Infusions:   sodium chloride  Stopped (07/14/23 0216)   cefTRIAXone (ROCEPHIN)  IV Stopped (07/13/23 2022)   metronidazole  Stopped (07/14/23 0055)   potassium PHOSPHATE IVPB (in mmol) 43 mL/hr at 07/14/23 1001    Antimicrobials: Anti-infectives (From admission, onward)    Start     Dose/Rate Route Frequency Ordered Stop   07/12/23 0000  metroNIDAZOLE  (FLAGYL ) IVPB 500 mg  500 mg 100 mL/hr over 60 Minutes Intravenous Every 12 hours 07/11/23 1516     07/11/23 2000  cefTRIAXone (ROCEPHIN) 2 g in sodium chloride  0.9 % 100 mL IVPB        2 g 200 mL/hr over 30 Minutes Intravenous Every 24 hours 07/11/23 1516     07/11/23 1230  ceFEPIme (MAXIPIME) 2 g in sodium chloride  0.9 % 100 mL IVPB        2 g 200 mL/hr over 30 Minutes Intravenous  Once 07/11/23 1216 07/11/23 1318   07/11/23 1230  metroNIDAZOLE  (FLAGYL ) IVPB 500 mg        500 mg 100 mL/hr over 60 Minutes Intravenous  Once 07/11/23 1216 07/11/23 1417       Objective: Vitals:   07/14/23 0904 07/14/23 1100  BP:    Pulse:  92  Resp:  12  Temp: 98.6 F (37 C)   SpO2:  93%    Intake/Output Summary (Last 24 hours) at 07/14/2023 1115 Last data filed at 07/14/2023 1001 Gross per 24 hour  Intake 1047.42 ml  Output --  Net 1047.42 ml   Filed Weights   07/11/23 1051  Weight: 52.2 kg   Weight change:  Body mass index is 17.49 kg/m.   Physical Exam: General exam: Pleasant, middle-aged African-American male.  Thin built Skin: No rashes, lesions or ulcers. HEENT: Atraumatic, normocephalic, no obvious bleeding Lungs: Clear to auscultation bilaterally,  CVS: S1, S2, no murmur,   GI/Abd: Soft, nontender, distended.  Ascites seems to be reaccumulating., bowel sound present,   CNS: Alert, awake, oriented x 3.   No tremors  psychiatry: Sad affect Extremities: No pedal edema, no calf tenderness,   Data Review: I have personally reviewed the laboratory data and studies available.  F/u labs ordered Unresulted Labs (From admission, onward)     Start     Ordered   07/14/23 0500  CBC with Differential/Platelet  Daily,   R     Question:  Specimen collection method  Answer:  Lab=Lab collect   07/13/23 0842   07/14/23 0500  Comprehensive metabolic panel with GFR  Daily,   R     Question:  Specimen collection method  Answer:  Lab=Lab collect   07/13/23 0842   07/11/23 2329  Occult blood card to lab, stool  ONCE - STAT,   STAT        07/11/23 2328           Signed, Hoyt Macleod, MD [facility] 07/14/2023

## 2023-07-15 ENCOUNTER — Other Ambulatory Visit (HOSPITAL_COMMUNITY): Payer: Self-pay

## 2023-07-15 DIAGNOSIS — D6859 Other primary thrombophilia: Secondary | ICD-10-CM | POA: Insufficient documentation

## 2023-07-15 DIAGNOSIS — R6510 Systemic inflammatory response syndrome (SIRS) of non-infectious origin without acute organ dysfunction: Secondary | ICD-10-CM

## 2023-07-15 DIAGNOSIS — D649 Anemia, unspecified: Secondary | ICD-10-CM | POA: Insufficient documentation

## 2023-07-15 DIAGNOSIS — R760 Raised antibody titer: Secondary | ICD-10-CM | POA: Insufficient documentation

## 2023-07-15 LAB — CBC WITH DIFFERENTIAL/PLATELET
Abs Immature Granulocytes: 0.05 10*3/uL (ref 0.00–0.07)
Basophils Absolute: 0.1 10*3/uL (ref 0.0–0.1)
Basophils Relative: 1 %
Eosinophils Absolute: 0.1 10*3/uL (ref 0.0–0.5)
Eosinophils Relative: 1 %
HCT: 22 % — ABNORMAL LOW (ref 39.0–52.0)
Hemoglobin: 7 g/dL — ABNORMAL LOW (ref 13.0–17.0)
Immature Granulocytes: 0 %
Lymphocytes Relative: 26 %
Lymphs Abs: 3 10*3/uL (ref 0.7–4.0)
MCH: 30.7 pg (ref 26.0–34.0)
MCHC: 31.8 g/dL (ref 30.0–36.0)
MCV: 96.5 fL (ref 80.0–100.0)
Monocytes Absolute: 1.8 10*3/uL — ABNORMAL HIGH (ref 0.1–1.0)
Monocytes Relative: 16 %
Neutro Abs: 6.3 10*3/uL (ref 1.7–7.7)
Neutrophils Relative %: 56 %
Platelets: 101 10*3/uL — ABNORMAL LOW (ref 150–400)
RBC: 2.28 MIL/uL — ABNORMAL LOW (ref 4.22–5.81)
WBC: 11.3 10*3/uL — ABNORMAL HIGH (ref 4.0–10.5)
nRBC: 0.4 % — ABNORMAL HIGH (ref 0.0–0.2)

## 2023-07-15 LAB — COMPREHENSIVE METABOLIC PANEL WITH GFR
ALT: 54 U/L — ABNORMAL HIGH (ref 0–44)
AST: 240 U/L — ABNORMAL HIGH (ref 15–41)
Albumin: 1.8 g/dL — ABNORMAL LOW (ref 3.5–5.0)
Alkaline Phosphatase: 145 U/L — ABNORMAL HIGH (ref 38–126)
Anion gap: 10 (ref 5–15)
BUN: 9 mg/dL (ref 6–20)
CO2: 23 mmol/L (ref 22–32)
Calcium: 7.6 mg/dL — ABNORMAL LOW (ref 8.9–10.3)
Chloride: 99 mmol/L (ref 98–111)
Creatinine, Ser: 0.42 mg/dL — ABNORMAL LOW (ref 0.61–1.24)
GFR, Estimated: >60 mL/min (ref >60)
Glucose, Bld: 94 mg/dL (ref 70–99)
Potassium: 3.7 mmol/L (ref 3.5–5.1)
Sodium: 132 mmol/L — ABNORMAL LOW (ref 135–145)
Total Bilirubin: 3.3 mg/dL — ABNORMAL HIGH (ref 0.0–1.2)
Total Protein: 5.8 g/dL — ABNORMAL LOW (ref 6.5–8.1)

## 2023-07-15 LAB — MAGNESIUM: Magnesium: 1.3 mg/dL — ABNORMAL LOW (ref 1.7–2.4)

## 2023-07-15 LAB — PHOSPHORUS: Phosphorus: 3.4 mg/dL (ref 2.5–4.6)

## 2023-07-15 MED ORDER — NICOTINE 7 MG/24HR TD PT24: 7.0000 mg | MEDICATED_PATCH | TRANSDERMAL | 0 refills | Status: DC

## 2023-07-15 MED ORDER — SPIRONOLACTONE 50 MG PO TABS: 50.0000 mg | ORAL_TABLET | Freq: Every day | ORAL | 0 refills | Status: DC

## 2023-07-15 MED ORDER — MAGNESIUM SULFATE 2 GM/50ML IV SOLN
2.0000 g | Freq: Once | INTRAVENOUS | Status: AC
  Administered 2023-07-15: 2 g via INTRAVENOUS
  Filled 2023-07-15: qty 50

## 2023-07-15 MED ORDER — NICOTINE 14 MG/24HR TD PT24: 14.0000 mg | MEDICATED_PATCH | Freq: Every day | TRANSDERMAL | 0 refills | Status: DC

## 2023-07-15 MED ORDER — FUROSEMIDE 40 MG PO TABS: 40.0000 mg | ORAL_TABLET | Freq: Every day | ORAL | 0 refills | Status: DC

## 2023-07-15 MED ORDER — ADULT MULTIVITAMIN W/MINERALS CH: 1.0000 | ORAL_TABLET | Freq: Every day | ORAL | 0 refills | Status: DC

## 2023-07-15 MED ORDER — LACTULOSE 10 GM/15ML PO SOLN: 30.0000 g | Freq: Two times a day (BID) | ORAL | 0 refills | Status: DC

## 2023-07-15 NOTE — Discharge Summary (Signed)
 Physician Discharge Summary  Samuel Cantrell MWN:[id number] DOB: 1975-03-13 DOA: 07/11/2023  PCP: Sylvia Everts, PA-C  Admit date: 07/11/2023 Discharge date: 07/15/2023 Discharging to: Home Recommendations for Outpatient Follow-up:  Please follow-up electrolytes and fluid balance at next visit Please follow closely for bleeding Please continue to encourage smoking cessation     Discharge Diagnoses:   Principal Problem:   SIRS (systemic inflammatory response syndrome) (HCC) Active Problems:   Lactic acidosis   Cirrhosis of liver with ascites (HCC)   Hypokalemia   History of DVT (deep vein thrombosis)   Elevated liver function tests   Smoker   Tobacco abuse   Polyneuropathy   Thrombocytopenia (HCC)   Essential hypertension   Protein C deficiency (HCC)   Lupus anticoagulant positive   Normocytic anemia     Hospital Course:  Brief hospital course: This is a 48 year old male with history of alcohol use, cirrhosis of the liver, protein C deficiency, antiphospholipid antibody syndrome with history of DVT x 2, hypertension, chronic back pain and GERD.  He is not a very good historian.  His wife is on the phone and she assists with the history.  The patient states that he has not drank alcohol in over a month but has noted increased swelling of his abdomen.  He has also been having pain across his lower back.  He was unable to afford Xarelto  and has been recommended to take a full aspirin  by hematology instead. In the ED, notable findings included: Tachycardia with heart rate up to 130s Potassium 3.1, magnesium 1.5, phosphorus 2.1 AST 287, ALT 70, T. bili 2.6, alkaline phosphatase 212, lipase 64 Lactic acid was initially 5.2 and when repeated was 6.5. WBC count 12.2, hemoglobin 8.8, platelets 92 INR 1.7 with a PT of 20.1  Ultrasound revealed cirrhosis of the liver, cholelithiasis with gallbladder sludge and mild diffuse gallbladder wall thickening.  Negative Murphy sign and  moderate ascites.  Blood cultures ordered, ceftriaxone and metronidazole  given, IV fluids given.  Paracentesis performed and 2.1 L removed.  Fluid appeared to be transudative.  Principal Problem:   SIRS (systemic inflammatory response syndrome) -sepsis ruled out - The patient was treated with ceftriaxone and metronidazole  and today will be day 5 after which it will be discontinued - No source of infection found and therefore current diagnosis is SIRS and not sepsis - He does continue to have mild tachycardia and mild leukocytosis with a WBC count of 11.3 - Blood cultures and ascitic fluid culture negative  Active Problems:   Lactic acidosis - Likely secondary to acute decompensated cirrhosis  Decompensated cirrhosis with ascites, anemia, thrombocytopenia, severe hypoalbuminemia and elevated INR - As mentioned above, paracentesis performed - Likely secondary to alcohol abuse which the patient claims to have discontinued-wife is in agreement - The patient was also initiated on lactulose, furosemide and spironolactone - I have had a discussion with the patient and his wife regarding his cirrhosis and management - In addition I have discussed fluid management, diuretic treatment, close monitoring of ascites, and close follow-up - The patient already has an appointment in 2 days with GI for further management  Electrolyte abnormalities: Hypokalemia, hypomagnesemia, hypophosphatemia - Being replaced and will need to be followed closely as outpatient - The patient states that he takes magnesium supplements at home  Nicotine  abuse - Ongoing -  counseling done today and prescriptions written for nicotine  patches  Protein C deficiency, lupus anticoagulant positive, history of thrombosis x 2 Normocytic anemia Thrombocytopenia - Follows with hematology and  cannot afford Xarelto  and is therefore maintained on aspirin  325 mg daily - Does not complain of any nausea, vomiting, hematemesis or blood  in stools but will need to be monitored for this closely  Peripheral neuropathy - Continue gabapentin   Underweight Estimated body mass index is 17.49 kg/m as calculated from the following:   Height as of this encounter: 5\' 8"  (1.727 m).   Weight as of this encounter: 52.2 kg.            Discharge Instructions  Discharge Instructions     Ambulatory referral to Physical Therapy   Complete by: As directed    Call MD for:  difficulty breathing, headache or visual disturbances   Complete by: As directed    Call MD for:  extreme fatigue   Complete by: As directed    Call MD for:  persistant dizziness or light-headedness   Complete by: As directed    Call MD for:  persistant nausea and vomiting   Complete by: As directed    Diet - low sodium heart healthy   Complete by: As directed    Limit fluid intake to less than 1.5 L a day please   Increase activity slowly   Complete by: As directed       Allergies as of 07/15/2023   No Known Allergies      Medication List     TAKE these medications    aspirin  EC 325 MG tablet Take 325 mg by mouth daily.   carvedilol  12.5 MG tablet Commonly known as: COREG  TAKE 1 TABLET(12.5 MG) BY MOUTH TWICE DAILY WITH A MEAL   diphenhydrAMINE 25 MG tablet Commonly known as: BENADRYL Take 25 mg by mouth every 6 (six) hours as needed for itching or allergies.   folic acid  1 MG tablet Commonly known as: FOLVITE  TAKE 1 TABLET BY MOUTH DAILY   furosemide 40 MG tablet Commonly known as: LASIX Take 1 tablet (40 mg total) by mouth daily. Start taking on: Jul 16, 2023   gabapentin  600 MG tablet Commonly known as: NEURONTIN  Take 2 caps in AM, 3 caps in PM What changed:  how much to take how to take this when to take this additional instructions   lactulose 10 GM/15ML solution Commonly known as: CHRONULAC Take 45 mLs (30 g total) by mouth 2 (two) times daily.   multivitamin with minerals Tabs tablet Take 1 tablet by mouth  daily. Start taking on: Jul 16, 2023   nicotine  7 mg/24hr patch Commonly known as: NICODERM CQ  - dosed in mg/24 hr Place 1 patch (7 mg total) onto the skin daily.   nicotine  14 mg/24hr patch Commonly known as: NICODERM CQ  - dosed in mg/24 hours Place 1 patch (14 mg total) onto the skin daily. Start taking on: Jul 16, 2023   nortriptyline  10 MG capsule Commonly known as: PAMELOR  TAKE 1 CAPSULE(10 MG) BY MOUTH AT BEDTIME What changed: See the new instructions.   potassium chloride  SA 20 MEQ tablet Commonly known as: KLOR-CON  M Take 1 tablet (20 mEq total) by mouth daily.   spironolactone 50 MG tablet Commonly known as: ALDACTONE Take 1 tablet (50 mg total) by mouth daily. Start taking on: Jul 16, 2023   thiamine  100 MG tablet Commonly known as: VITAMIN B1 Take 1 tablet (100 mg total) by mouth daily.               Durable Medical Equipment  (From admission, onward)  Start     Ordered   07/14/23 1241  For home use only DME Walker rolling  Once       Question Answer Comment  Walker: With 5 Inch Wheels   Patient needs a walker to treat with the following condition Impaired mobility      07/14/23 1240            Follow-up Information     [facility] Call.   Why: Someone from [facility] will call you to set up your Home Health Physical Therapy services once you discharge from the hospital. Contact information: [address] [place] [address] [phone]                    The results of significant diagnostics from this hospitalization (including imaging, microbiology, ancillary and laboratory) are listed below for reference.    US  Paracentesis Result Date: 07/12/2023 INDICATION: 48 year old male with alcoholic cirrhosis, ascites. Request made for therapeutic paracentesis. EXAM: ULTRASOUND GUIDED THERAPEUTIC PARACENTESIS MEDICATIONS: 10 mL 1% lidocaine COMPLICATIONS: None immediate. PROCEDURE:  Informed written consent was obtained from the patient after a discussion of the risks, benefits and alternatives to treatment. A timeout was performed prior to the initiation of the procedure. Initial ultrasound scanning demonstrates a small amount of ascites within the right lower abdominal quadrant. The right lower abdomen was prepped and draped in the usual sterile fashion. 1% lidocaine was used for local anesthesia. Following this, a 19 gauge, 7-cm, Yueh catheter was introduced. An ultrasound image was saved for documentation purposes. The paracentesis was performed. The catheter was removed and a dressing was applied. The patient tolerated the procedure well without immediate post procedural complication. FINDINGS: A total of approximately 2.5 liters of yellow fluid was removed. IMPRESSION: Successful ultrasound-guided paracentesis yielding 2.5 liters of peritoneal fluid. Performed by: Kacie Matthews PA-C Electronically Signed   By: Creasie Doctor M.D.   On: 07/12/2023 15:01   US  Abdomen Limited RUQ (LIVER/GB) Result Date: 07/11/2023 CLINICAL DATA:  Abdominal pain.  Bloating.  Cirrhosis. EXAM: ULTRASOUND ABDOMEN LIMITED RIGHT UPPER QUADRANT COMPARISON:  04/12/2023 FINDINGS: Gallbladder: Gallbladder is mildly distended and contains sludge is well as a few tiny sub-cm gallstones. Mild diffuse gallbladder wall thickening is seen, without significant change since prior study, likely due to cirrhosis. No sonographic Murphy sign noted by sonographer. Common bile duct: Diameter: Not visualized Liver: Nodular capsular contour is consistent with cirrhosis. Diffuse markedly increased parenchymal echogenicity is also consistent with cirrhosis, and significantly limits visualization of the hepatic parenchyma. Portal vein cannot be visualized on this exam. Other: Moderate ascites is noted. IMPRESSION: Hepatic cirrhosis, which significantly limits visualization of the hepatic parenchyma. Common bile duct not visualized.  Cholelithiasis and gallbladder sludge. Mild diffuse gallbladder wall thickening, likely due to cirrhosis. No sonographic Murphy sign noted. Moderate ascites. Electronically Signed   By: Marlyce Sine M.D.   On: 07/11/2023 14:34   Labs:   Basic Metabolic Panel: Recent Labs  Lab 07/11/23 1113 07/12/23 0306 07/13/23 0302 07/14/23 0303 07/15/23 0604  NA 138 136 134* 135 132*  K 3.1* 3.9 3.1* 3.4* 3.7  CL 96* 100 98 101 99  CO2 20* 27 29 26 23   GLUCOSE 108* 93 106* 118* 94  BUN <5* 7 8 8 9   CREATININE 0.62 0.48* 0.46* 0.33* 0.42*  CALCIUM 8.0* 7.6* 7.3* 7.9* 7.6*  MG 1.5*  --   --  1.4* 1.3*  PHOS 2.1*  --   --  <  1.0* 3.4     CBC: Recent Labs  Lab 07/11/23 1113 07/11/23 2349 07/12/23 0306 07/13/23 0302 07/14/23 0303 07/15/23 0610  WBC 12.2*  --  11.7* 13.0* 12.1* 11.3*  NEUTROABS 7.4  --   --  8.5* 7.7 6.3  HGB 8.8* 7.2* 7.1* 7.4* 7.1* 7.0*  HCT 25.3* 22.2* 21.7* 22.8* 21.3* 22.0*  MCV 86.1  --  91.2 93.1 90.6 96.5  PLT 92*  --  PLATELET CLUMPS NOTED ON SMEAR, COUNT APPEARS DECREASED 93* 97* 101*         SIGNED:   Sedalia Dacosta, MD  [facility] 07/15/2023, 12:17 PM Time taking on discharge: 50 minutes

## 2023-07-15 NOTE — Progress Notes (Signed)
 AVS reviewed w/ pt who verbalized an understanding. No there questions at this time- AVS, printed script  for multivitamin along w/ Good RX cards placed in envelope. Tel box removed- [employer] tele called by this Char[employer] fundraiser. PIV removed as noted. Pt's s/o enroute to hospital w/ clothes to wear home

## 2023-07-15 NOTE — TOC Transition Note (Addendum)
 Transition of Care [facility]) - Discharge Note   Patient Details  Name: Samuel Cantrell MRN: [id number] Date of Birth: Jul 30, 1975  [facility] CM/SW Contact:  Tessie Fila, RN Phone Number: 07/15/2023, 1:46 PM   Clinical Narrative:    Pt discharging home. Spoke with pt about his alcohol use and he states that he has cut back on drinking the past few weeks. Pt agreeable to have resources added to AVS. Resources added to AVS. Pt states he is able to afford medication at discharge. Coupons for [facility] through [employer] given to help with cost of medications. Pt set up with [facility] for HHPT and information added to AVS. Pt will be transported home by family/friend. There are no other TOC needs or recommendations.  Addendum: [facility] due to pt having no insurance.  Final next level of care: Home w Home Health Services Barriers to Discharge: No Barriers Identified   Patient Goals and CMS Choice Patient states their goals for this hospitalization and ongoing recovery are:: To return home CMS Medicare.gov Compare Post Acute Care list provided to:: Other (Comment Required) (N/A) Choice offered to / list presented to : NA [place] ownership interest in [facility].provided to:: Parent NA    Discharge Placement                       Discharge Plan and Services Additional resources added to the After Visit Summary for   In-house Referral: NA              DME Arranged: N/A DME Agency: NA       HH Arranged: PT HH Agency: [facility] Date [facility] Agency Contacted: 07/15/23 Time HH Agency Contacted: 1053 Representative spoke with at [facility] Agency: Amy Lara Plants [phone]  Social Drivers of Health (SDOH) Interventions SDOH Screenings   Food Insecurity: No Food Insecurity (07/11/2023)  Housing: High Risk (07/11/2023)  Transportation Needs: No Transportation Needs (07/11/2023)  Utilities: Not At Risk (07/11/2023)  Depression  (PHQ2-9): Low Risk  (12/16/2019)  Social Connections: Unknown (07/22/2021)   Received from [facility]  Tobacco Use: High Risk (07/11/2023)     Readmission Risk Interventions    07/13/2023   10:53 AM  Readmission Risk Prevention Plan  Transportation Screening Complete  PCP or Specialist Appt within 5-7 Days Complete  Home Care Screening Complete  Medication Review (RN CM) Complete

## 2023-07-15 NOTE — Plan of Care (Signed)
 Patient ID: Samuel Cantrell, male   DOB: Jul 15, 1975, 48 y.o.   MRN: [id number]  Problem: Education: Goal: Knowledge of General Education information will improve Description: Including pain rating scale, medication(s)/side effects and non-pharmacologic comfort measures Outcome: Adequate for Discharge   Problem: Health Behavior/Discharge Planning: Goal: Ability to manage health-related needs will improve Outcome: Adequate for Discharge   Problem: Clinical Measurements: Goal: Ability to maintain clinical measurements within normal limits will improve Outcome: Adequate for Discharge Goal: Will remain free from infection Outcome: Adequate for Discharge Goal: Diagnostic test results will improve Outcome: Adequate for Discharge Goal: Respiratory complications will improve Outcome: Adequate for Discharge Goal: Cardiovascular complication will be avoided Outcome: Adequate for Discharge   Problem: Activity: Goal: Risk for activity intolerance will decrease Outcome: Adequate for Discharge   Problem: Nutrition: Goal: Adequate nutrition will be maintained Outcome: Adequate for Discharge   Problem: Coping: Goal: Level of anxiety will decrease Outcome: Adequate for Discharge   Problem: Elimination: Goal: Will not experience complications related to bowel motility Outcome: Adequate for Discharge Goal: Will not experience complications related to urinary retention Outcome: Adequate for Discharge   Problem: Pain Managment: Goal: General experience of comfort will improve and/or be controlled Outcome: Adequate for Discharge   Problem: Safety: Goal: Ability to remain free from injury will improve Outcome: Adequate for Discharge   Problem: Skin Integrity: Goal: Risk for impaired skin integrity will decrease Outcome: Adequate for Discharge   Problem: Acute Rehab PT Goals(only PT should resolve) Goal: Patient Will Transfer Sit To/From Stand Outcome: Adequate for Discharge Goal: Pt  Will Ambulate Outcome: Adequate for Discharge Goal: Pt Will Go Up/Down Stairs Outcome: Adequate for Discharge Goal: Pt/caregiver will Perform Home Exercise Program Outcome: Adequate for Discharge    Genella Kendall, RN

## 2023-07-16 LAB — CULTURE, BLOOD (ROUTINE X 2)
Culture: NO GROWTH
Culture: NO GROWTH

## 2023-07-17 ENCOUNTER — Telehealth: Payer: Self-pay

## 2023-07-22 ENCOUNTER — Ambulatory Visit: Payer: Self-pay | Admitting: Physician Assistant

## 2023-07-24 ENCOUNTER — Telehealth: Payer: Self-pay

## 2023-07-24 ENCOUNTER — Telehealth: Payer: Self-pay | Admitting: Family

## 2023-07-24 NOTE — Telephone Encounter (Signed)
 Spoke with funeral home , made them aware still have not received death certificate , stated she would resend it.  Case # is [id number]    Copied from CRM [id number]. Topic: General - Deceased Patient >> 08/17/23 12:36 PM Dewanda Foots wrote: Name of caller: Samuel Cantrell  Date of death: 08/10/2023   Name of funeral home: [facility]  Phone number of funeral home: [phone]  Provider that needs to sign form: Dr. Alysia Jumbo or Georgianna Kirschner while he was out of town.  Timeline for signing: "Needs quickly so we can get him cremated"  Samuel Cantrell states they are still awaiting the signature for the death certificate that she sent over either Monday or Tuesday of this week.

## 2023-07-24 NOTE — Telephone Encounter (Signed)
 I filled out death certificate but Samuel Cantrell is not allowing me to sign it off. Advised Marine scientist that I will call help desk tomorrow when they are hopefully open.

## 2023-07-25 ENCOUNTER — Telehealth: Payer: Self-pay | Admitting: Family

## 2023-07-25 NOTE — Telephone Encounter (Signed)
 Death Certificate completed in [facility] [place] as PA Saguier and Dr.  Crecencio Dodge were both on vacation.

## 2023-08-09 NOTE — Telephone Encounter (Signed)
 Spoke with funeral home , stated family has not viewed the body yet once family views the body they will do the report , made her aware you would be going out of town next week and she said she would try to get it over tomorrow , but a stamp is fine if the report isnt complete

## 2023-08-09 NOTE — Telephone Encounter (Signed)
 Caller Name: Gayl Katos, [place] Phone #:     Date of death: 08/13/2023 Place of death: in home  Time of death: 8:11 am Funeral Home: [facility]

## 2023-08-09 NOTE — Telephone Encounter (Signed)
 Copied from CRM [id number]. Topic: General - Deceased Patient >> 08/06/2023  8:45 AM Marlan Silva wrote: Name of caller: Gayl Katos, EMT called in to let patients primary know that he was found deceased in his apartment this morning 2023-08-06 @ 8:11am.  Date of death: 06-Aug-2023 8:11am   Name of funeral home: [facility]  Phone number of funeral home: [phone]  Provider that needs to sign form: PA Gaylin Ke, Biochemist, clinical  Timeline for signing: Pending, funeral home will be reaching out.

## 2023-08-09 DEATH — deceased

## 2023-09-02 ENCOUNTER — Ambulatory Visit: Payer: BC Managed Care – PPO | Admitting: Neurology

## 2023-09-02 ENCOUNTER — Other Ambulatory Visit: Payer: BC Managed Care – PPO

## 2023-10-07 ENCOUNTER — Other Ambulatory Visit: Payer: Self-pay | Admitting: Neurology
